# Patient Record
Sex: Female | Born: 1960 | Race: White | Hispanic: No | Smoking: Never smoker
Health system: Southern US, Community
[De-identification: ages and names within clinical notes are randomized; demographics above are authoritative.]

## PROBLEM LIST (undated history)

## (undated) DIAGNOSIS — E119 Type 2 diabetes mellitus without complications: Secondary | ICD-10-CM

## (undated) DIAGNOSIS — E079 Disorder of thyroid, unspecified: Secondary | ICD-10-CM

## (undated) DIAGNOSIS — T7840XA Allergy, unspecified, initial encounter: Secondary | ICD-10-CM

## (undated) DIAGNOSIS — J40 Bronchitis, not specified as acute or chronic: Secondary | ICD-10-CM

## (undated) DIAGNOSIS — Z923 Personal history of irradiation: Secondary | ICD-10-CM

## (undated) DIAGNOSIS — E785 Hyperlipidemia, unspecified: Secondary | ICD-10-CM

## (undated) DIAGNOSIS — C50919 Malignant neoplasm of unspecified site of unspecified female breast: Secondary | ICD-10-CM

## (undated) DIAGNOSIS — J45909 Unspecified asthma, uncomplicated: Secondary | ICD-10-CM

## (undated) DIAGNOSIS — I10 Essential (primary) hypertension: Secondary | ICD-10-CM

## (undated) DIAGNOSIS — C50311 Malignant neoplasm of lower-inner quadrant of right female breast: Secondary | ICD-10-CM

## (undated) DIAGNOSIS — Z9221 Personal history of antineoplastic chemotherapy: Secondary | ICD-10-CM

## (undated) HISTORY — DX: Bronchitis, not specified as acute or chronic: J40

## (undated) HISTORY — PX: DILATION AND CURETTAGE OF UTERUS: SHX78

## (undated) HISTORY — DX: Disorder of thyroid, unspecified: E07.9

## (undated) HISTORY — DX: Malignant neoplasm of unspecified site of unspecified female breast: C50.919

## (undated) HISTORY — DX: Unspecified asthma, uncomplicated: J45.909

## (undated) HISTORY — DX: Hyperlipidemia, unspecified: E78.5

## (undated) HISTORY — DX: Malignant neoplasm of lower-inner quadrant of right female breast: C50.311

## (undated) HISTORY — DX: Type 2 diabetes mellitus without complications: E11.9

## (undated) HISTORY — DX: Allergy, unspecified, initial encounter: T78.40XA

## (undated) HISTORY — DX: Essential (primary) hypertension: I10

---

## 2005-09-03 ENCOUNTER — Ambulatory Visit: Payer: Self-pay | Admitting: Unknown Physician Specialty

## 2006-12-19 ENCOUNTER — Ambulatory Visit: Payer: Self-pay | Admitting: Unknown Physician Specialty

## 2008-01-08 ENCOUNTER — Ambulatory Visit: Payer: Self-pay | Admitting: Unknown Physician Specialty

## 2008-12-09 ENCOUNTER — Ambulatory Visit: Payer: Self-pay | Admitting: Internal Medicine

## 2008-12-23 ENCOUNTER — Ambulatory Visit: Payer: Self-pay | Admitting: Internal Medicine

## 2009-01-09 ENCOUNTER — Ambulatory Visit: Payer: Self-pay | Admitting: Internal Medicine

## 2009-02-03 ENCOUNTER — Ambulatory Visit: Payer: Self-pay | Admitting: Unknown Physician Specialty

## 2009-02-08 ENCOUNTER — Ambulatory Visit: Payer: Self-pay | Admitting: Internal Medicine

## 2009-03-11 ENCOUNTER — Ambulatory Visit: Payer: Self-pay | Admitting: Internal Medicine

## 2009-06-09 ENCOUNTER — Ambulatory Visit: Payer: Self-pay | Admitting: Internal Medicine

## 2009-07-07 ENCOUNTER — Ambulatory Visit: Payer: Self-pay | Admitting: Internal Medicine

## 2009-07-09 ENCOUNTER — Ambulatory Visit: Payer: Self-pay | Admitting: Internal Medicine

## 2010-01-05 ENCOUNTER — Ambulatory Visit: Payer: Self-pay | Admitting: Internal Medicine

## 2010-01-09 ENCOUNTER — Ambulatory Visit: Payer: Self-pay | Admitting: Internal Medicine

## 2010-02-16 ENCOUNTER — Ambulatory Visit: Payer: Self-pay | Admitting: Unknown Physician Specialty

## 2010-03-20 ENCOUNTER — Ambulatory Visit: Payer: Self-pay | Admitting: Internal Medicine

## 2010-04-11 ENCOUNTER — Ambulatory Visit: Payer: Self-pay | Admitting: Internal Medicine

## 2010-07-13 ENCOUNTER — Ambulatory Visit: Payer: Self-pay | Admitting: Internal Medicine

## 2010-08-08 LAB — CA 125: CA 125: 8.8 U/mL (ref 0.0–34.0)

## 2010-08-10 ENCOUNTER — Ambulatory Visit: Payer: Self-pay | Admitting: Internal Medicine

## 2011-04-24 ENCOUNTER — Ambulatory Visit: Payer: Self-pay | Admitting: Unknown Physician Specialty

## 2013-03-11 DIAGNOSIS — C50919 Malignant neoplasm of unspecified site of unspecified female breast: Secondary | ICD-10-CM

## 2013-03-11 HISTORY — DX: Malignant neoplasm of unspecified site of unspecified female breast: C50.919

## 2013-08-09 DIAGNOSIS — Z171 Estrogen receptor negative status [ER-]: Secondary | ICD-10-CM

## 2013-08-09 DIAGNOSIS — C50311 Malignant neoplasm of lower-inner quadrant of right female breast: Secondary | ICD-10-CM

## 2013-08-09 HISTORY — DX: Malignant neoplasm of lower-inner quadrant of right female breast: C50.311

## 2013-09-03 ENCOUNTER — Ambulatory Visit: Payer: Self-pay | Admitting: Unknown Physician Specialty

## 2013-09-07 ENCOUNTER — Encounter: Payer: Self-pay | Admitting: General Surgery

## 2013-09-07 ENCOUNTER — Ambulatory Visit: Payer: BC Managed Care – PPO

## 2013-09-07 ENCOUNTER — Other Ambulatory Visit: Payer: Self-pay | Admitting: General Surgery

## 2013-09-07 ENCOUNTER — Ambulatory Visit (INDEPENDENT_AMBULATORY_CARE_PROVIDER_SITE_OTHER): Payer: BC Managed Care – PPO | Admitting: General Surgery

## 2013-09-07 VITALS — BP 158/80 | HR 80 | Resp 14 | Ht 62.0 in | Wt 230.0 lb

## 2013-09-07 DIAGNOSIS — N63 Unspecified lump in unspecified breast: Secondary | ICD-10-CM

## 2013-09-07 DIAGNOSIS — N6310 Unspecified lump in the right breast, unspecified quadrant: Secondary | ICD-10-CM

## 2013-09-07 HISTORY — PX: BREAST BIOPSY: SHX20

## 2013-09-07 NOTE — Progress Notes (Signed)
Patient ID: Felicia Kidd, female   DOB: December 03, 1960, 53 y.o.   MRN: [id number]  Chief Complaint  Patient presents with  . Breast Problem    cat 4 mammogam    HPI Felicia Kidd is a 53 y.o. female. who presents for a breast evaluation. The most recent mammogram was done on 09/02/13. Patient does perform regular self breast checks and gets regular mammograms done.  States she can't feel anything different in the breast. Grand kids may jump on her occasionally but no other breast trauma.  No family history of breast cancer.  The patient is accompanied today by her mother-in-law. The patient was widowed 7 years ago. She is quite close to her mother-in-law.   HPI  Past Medical History  Diagnosis Date  . Diabetes mellitus without complication   . Hyperlipidemia   . Hypertension   . Thyroid disease     Past Surgical History  Procedure Laterality Date  . Dilation and curettage of uterus      Dr Vernie Ammons    No family history on file.  Social History History  Substance Use Topics  . Smoking status: Never Smoker   . Smokeless tobacco: Never Used  . Alcohol Use: No    No Known Allergies  Current Outpatient Prescriptions  Medication Sig Dispense Refill  . atorvastatin (LIPITOR) 40 MG tablet Take 40 mg by mouth daily.      . fluticasone (FLONASE) 50 MCG/ACT nasal spray Place 2 sprays into both nostrils daily.      . Halcinonide (HALOG) 0.1 % CREA Apply 1 Tube topically 2 (two) times daily.      Marland Kitchen omeprazole (PRILOSEC) 20 MG capsule Take 20 mg by mouth daily.      Marland Kitchen telmisartan (MICARDIS) 80 MG tablet Take 80 mg by mouth daily.       No current facility-administered medications for this visit.    Review of Systems Review of Systems  Constitutional: Negative.   Respiratory: Negative.   Cardiovascular: Negative.     Blood pressure 158/80, pulse 80, resp. rate 14, height 5\' 2"  (1.575 m), weight 230 lb (104.327 kg).  Physical Exam Physical Exam  Constitutional: She  is oriented to person, place, and time. She appears well-developed and well-nourished.  Neck: Neck supple.  Cardiovascular: Normal rate, regular rhythm and normal heart sounds.   Pulmonary/Chest: Effort normal and breath sounds normal. Right breast exhibits no inverted nipple, no mass, no nipple discharge, no skin change and no tenderness. Left breast exhibits no inverted nipple, no mass, no nipple discharge, no skin change and no tenderness.    Right nipple transverse crease, intermittently present over several years by patient report, partially reducible with gentle pressure.   Thickening at 6 o'clock right breast 1.5 cm at 3 CFN  Lymphadenopathy:    She has no cervical adenopathy.    She has no axillary adenopathy.  Neurological: She is alert and oriented to person, place, and time.  Skin: Skin is warm and dry.    Data Reviewed 2013 thru 2015 mammograms and ultrasound studies were reviewed.   Screening mammograms completed at [place] dated 08/30/2013 suggested an ill-defined density in the right breast for which additional imaging was recommended. BI-RAD-0.  Spot compression views of the right breast an ultrasound dated 09/03/2013 confirmed a persistent mass. Ultrasound showed a 1.4 cm mass in the 6:00 position 4 cm from nipple as well as a suggested abnormal right axillary lymph node. BI-RAD-4.  Ultrasound examination of the right  breast confirmed a 1.0 x 1.3 x 1.47 cm irregular hypoechoic mass with focal shadowing.  This was at the 6:00 position, 3 cm from the nipple.  BI-RAD-5.     The patient was amenable to core biopsy. 10 cc of 0.5% Xylocaine with 0.25% Marcaine with 1-200,000 units of epinephrine was utilized well tolerated. Chlor prep was applied to the skin. Under ultrasound guidance a 14-gauge Finesse device was advanced and multiple core samples obtained from 2 locations. Scant bleeding was noted. A postbiopsy clip was NOT placed. (Should the patient be a candidate for  neoadjuvant chemotherapy this will be placed at a later date). The skin defect was closed with benzoin and Steri-Strips followed by Telfa and Tegaderm dressing.  Attention was turned to the axilla. There appeared to be a 0.9 x 1.2 cm irregular mass in the lower aspect of the axilla. FNA sampling was completed with a 22-gauge needle. 2 sets of slides were obtained (4 slides total) were submitted for pathologic review.   Assessment    Abnormal right breast mammogram, highly suspicious for malignancy.    Plan    The clinical impression was reviewed with the patient and her mother-in-law. She will be contacted when the pathology is available. She is aware that there would be a three day delay for the cytology report on the axillary area.    Patient will be sent to [facility] today to have the following labs drawn: CEA and CA 27.29.   PCP: Glendon Axe Ref Lonie Peak 09/08/2013, 7:05 AM

## 2013-09-07 NOTE — Patient Instructions (Addendum)
CARE AFTER BREAST BIOPSY  1. Leave the dressing on that your doctor applied after surgery. It is waterproof. You may bathe, shower and/or swim. The dressing will probably remain intact until your return office visit. If the dressing comes off, you will see small strips of tape against your skin on the incision. Do not remove these strips.  2. You may want to use a gauze,cloth or similar protection in your bra to prevent rubbing against your dressing and incision. This is not necessary, but you may feel more comfortable doing so.  3. It is recommended that you wear a bra day and night to give support to the breast. This will prevent the weight of the breast from pulling on the incision.  4. Your breast will feel hard and lumpy under the incision. Do not be alarmed. This is the underlying stitching of tissue. Softening of this tissue will occur in time.  5. Make sure you call the office and schedule an appointment in one week after your surgery. The office phone number is [phone]. The [facility] may have already done this for you.  6. You will notice about a week after your office visit that the strips of the tape on your incision will begin to loosen. These may then be removed.  7. Report to your doctor any of the following:  * Severe pain not relieved by your pain medication  *Redness of the incision  * Drainage from the incision  *Fever greater than 101 degrees  Patient will be sent to [facility] today to have the following labs drawn: CEA and CA 27.29.

## 2013-09-08 ENCOUNTER — Telehealth: Payer: Self-pay | Admitting: General Surgery

## 2013-09-08 DIAGNOSIS — N6310 Unspecified lump in the right breast, unspecified quadrant: Secondary | ICD-10-CM | POA: Insufficient documentation

## 2013-09-08 LAB — FINE-NEEDLE ASPIRATION

## 2013-09-08 LAB — CEA: CEA: 1.7 ng/mL (ref 0.0–4.7)

## 2013-09-08 LAB — CANCER ANTIGEN 27.29: CA 27.29: 19.2 U/mL (ref 0.0–38.6)

## 2013-09-08 NOTE — Telephone Encounter (Signed)
Notified of path results.  Arrangements in place to meet tomorrow afternoon at 5 PM.

## 2013-09-09 ENCOUNTER — Ambulatory Visit (INDEPENDENT_AMBULATORY_CARE_PROVIDER_SITE_OTHER): Payer: BC Managed Care – PPO | Admitting: General Surgery

## 2013-09-09 ENCOUNTER — Encounter: Payer: Self-pay | Admitting: General Surgery

## 2013-09-09 VITALS — BP 140/78 | HR 76 | Resp 12 | Ht 62.0 in | Wt 230.0 lb

## 2013-09-09 DIAGNOSIS — N6310 Unspecified lump in the right breast, unspecified quadrant: Secondary | ICD-10-CM

## 2013-09-09 DIAGNOSIS — N63 Unspecified lump in unspecified breast: Secondary | ICD-10-CM

## 2013-09-09 DIAGNOSIS — C50919 Malignant neoplasm of unspecified site of unspecified female breast: Secondary | ICD-10-CM | POA: Insufficient documentation

## 2013-09-09 DIAGNOSIS — C50911 Malignant neoplasm of unspecified site of right female breast: Secondary | ICD-10-CM

## 2013-09-09 LAB — IMMUNOHISTOCHEMICAL STAIN(S)

## 2013-09-09 NOTE — Progress Notes (Signed)
Patient ID: Felicia Kidd, female   DOB: 1960-12-25, 53 y.o.   MRN: [id number]  Chief Complaint  Patient presents with  . Follow-up    breast discussion    HPI Felicia Kidd is a 53 y.o. female here today for an breast discussion.Patient here today for follow up post right breast biopsy.  Dressing removed, steristrip in place and aware it may come off in one week.  Minimal bruising noted.  The patient is aware that a heating pad may be used for comfort as needed.  Aware of pathology.  HPI  Past Medical History  Diagnosis Date  . Diabetes mellitus without complication   . Hyperlipidemia   . Hypertension   . Thyroid disease     Past Surgical History  Procedure Laterality Date  . Dilation and curettage of uterus      Dr Vernie Ammons    No family history on file.  Social History History  Substance Use Topics  . Smoking status: Never Smoker   . Smokeless tobacco: Never Used  . Alcohol Use: No    No Known Allergies  Current Outpatient Prescriptions  Medication Sig Dispense Refill  . atorvastatin (LIPITOR) 40 MG tablet Take 40 mg by mouth daily.      . fluticasone (FLONASE) 50 MCG/ACT nasal spray Place 2 sprays into both nostrils daily.      . Halcinonide (HALOG) 0.1 % CREA Apply 1 Tube topically 2 (two) times daily.      Marland Kitchen omeprazole (PRILOSEC) 20 MG capsule Take 20 mg by mouth daily.      Marland Kitchen telmisartan (MICARDIS) 80 MG tablet Take 80 mg by mouth daily.       No current facility-administered medications for this visit.    Review of Systems Review of Systems  Respiratory: Negative.   Cardiovascular: Negative.     Blood pressure 140/78, pulse 76, resp. rate 12, height 5\' 2"  (1.575 m), weight 230 lb (104.327 kg).  Physical Exam Physical Exam  Data Reviewed Pathology showed a histologic grade 3 invasive mammary carcinoma of no specific type. Receptor status pending. Cytology on the axillary node FNA pending. CEA and CA 27-29 are normal. Assessment    Right  breast cancer, clinical stage 1, possible stage II based on abnormal axillary node on ultrasound.     Plan    The patient was accompanied today by her mother-in-law, son and sister. We spent the better part of an hour reviewing options for management. Mastectomy and breast conservation were present it is equivalent procedures. Considering the rapid development of a grade 3 latency over the past year, I suspect she will likely benefit from chemotherapy. Whether this is administered in the neoadjuvant or simply as adjuvant therapy wwill likely be determined by the cytology on the lymph node.  It was emphasized that whether or not the lymph gland is positive would not affect her choices for management of the breast primary.  The role for radiation therapy should breast conservation be chosen was discussed. An informational brochure was provided. She'll be contacted when her pending studies are available.       Robert Bellow 09/09/2013, 8:00 PM

## 2013-09-14 ENCOUNTER — Telehealth: Payer: Self-pay | Admitting: General Surgery

## 2013-09-14 NOTE — Telephone Encounter (Signed)
Reviewed cyto results with patient.   Plan for breast conservation unless tumor is markedly larger than expected.  Will arrange for wide excision, SLN and mastectomy only if needed.

## 2013-09-15 ENCOUNTER — Telehealth: Payer: Self-pay | Admitting: *Deleted

## 2013-09-15 ENCOUNTER — Other Ambulatory Visit: Payer: Self-pay | Admitting: General Surgery

## 2013-09-15 DIAGNOSIS — C50911 Malignant neoplasm of unspecified site of right female breast: Secondary | ICD-10-CM

## 2013-09-15 NOTE — Telephone Encounter (Signed)
Patient was contacted to arrange a surgery date. This has been scheduled for 09-27-13 at [facility]. Paperwork has been mailed to the patient. She was instructed to call the office should she have further questions.  Patient reports last menstrual period was greater than 2 years ago.

## 2013-09-17 LAB — HER-2 / NEU, FISH
Avg Num CEP17 probes/nucleus:: 2.1
Avg Num Her-2 signals/nucleus:: 2.6
HER-2/CEP17 RATIO: 1.24
NUMBER OF TUMOR CELLS COUNTED: 40
Number of Observers:: 2

## 2013-09-17 LAB — PATHOLOGY

## 2013-09-20 ENCOUNTER — Encounter: Payer: Self-pay | Admitting: General Surgery

## 2013-09-22 ENCOUNTER — Ambulatory Visit: Payer: Self-pay | Admitting: General Surgery

## 2013-09-22 LAB — COMPREHENSIVE METABOLIC PANEL
ALBUMIN: 3.7 g/dL (ref 3.4–5.0)
ALK PHOS: 122 U/L — AB
Anion Gap: 5 — ABNORMAL LOW (ref 7–16)
BUN: 14 mg/dL (ref 7–18)
Bilirubin,Total: 0.5 mg/dL (ref 0.2–1.0)
Calcium, Total: 9.4 mg/dL (ref 8.5–10.1)
Chloride: 101 mmol/L (ref 98–107)
Co2: 30 mmol/L (ref 21–32)
Creatinine: 0.7 mg/dL (ref 0.60–1.30)
EGFR (African American): >60
EGFR (Non-African Amer.): >60
GLUCOSE: 105 mg/dL — AB (ref 65–99)
Osmolality: 273 (ref 275–301)
POTASSIUM: 4 mmol/L (ref 3.5–5.1)
SGOT(AST): 20 U/L (ref 15–37)
SGPT (ALT): 32 U/L (ref 12–78)
SODIUM: 136 mmol/L (ref 136–145)
Total Protein: 7.4 g/dL (ref 6.4–8.2)

## 2013-09-22 LAB — CBC WITH DIFFERENTIAL/PLATELET
Basophil #: 0.1 10*3/uL (ref 0.0–0.1)
Basophil %: 0.5 %
Eosinophil #: 0.3 10*3/uL (ref 0.0–0.7)
Eosinophil %: 2.4 %
HCT: 40.2 % (ref 35.0–47.0)
HGB: 13.5 g/dL (ref 12.0–16.0)
LYMPHS PCT: 37 %
Lymphocyte #: 4 10*3/uL — ABNORMAL HIGH (ref 1.0–3.6)
MCH: 30.4 pg (ref 26.0–34.0)
MCHC: 33.6 g/dL (ref 32.0–36.0)
MCV: 91 fL (ref 80–100)
MONO ABS: 0.8 x10 3/mm (ref 0.2–0.9)
MONOS PCT: 7.4 %
NEUTROS ABS: 5.7 10*3/uL (ref 1.4–6.5)
Neutrophil %: 52.7 %
Platelet: 342 10*3/uL (ref 150–440)
RBC: 4.45 10*6/uL (ref 3.80–5.20)
RDW: 12.7 % (ref 11.5–14.5)
WBC: 10.9 10*3/uL (ref 3.6–11.0)

## 2013-09-27 ENCOUNTER — Ambulatory Visit: Payer: Self-pay | Admitting: General Surgery

## 2013-09-27 DIAGNOSIS — C50519 Malignant neoplasm of lower-outer quadrant of unspecified female breast: Secondary | ICD-10-CM

## 2013-09-27 HISTORY — PX: BREAST SURGERY: SHX581

## 2013-09-27 HISTORY — PX: BREAST LUMPECTOMY: SHX2

## 2013-09-28 ENCOUNTER — Encounter: Payer: Self-pay | Admitting: General Surgery

## 2013-09-29 ENCOUNTER — Encounter: Payer: Self-pay | Admitting: General Surgery

## 2013-09-30 ENCOUNTER — Encounter: Payer: Self-pay | Admitting: General Surgery

## 2013-09-30 ENCOUNTER — Ambulatory Visit: Payer: Self-pay | Admitting: Oncology

## 2013-09-30 ENCOUNTER — Ambulatory Visit (INDEPENDENT_AMBULATORY_CARE_PROVIDER_SITE_OTHER): Payer: Self-pay | Admitting: General Surgery

## 2013-09-30 ENCOUNTER — Encounter: Payer: Self-pay | Admitting: *Deleted

## 2013-09-30 VITALS — BP 142/82 | HR 88 | Resp 12 | Ht 62.0 in | Wt 239.0 lb

## 2013-09-30 DIAGNOSIS — C50911 Malignant neoplasm of unspecified site of right female breast: Secondary | ICD-10-CM

## 2013-09-30 DIAGNOSIS — C50919 Malignant neoplasm of unspecified site of unspecified female breast: Secondary | ICD-10-CM

## 2013-09-30 LAB — PATHOLOGY REPORT

## 2013-09-30 NOTE — Patient Instructions (Signed)
The patient is aware to call back for any questions or concerns.  

## 2013-09-30 NOTE — Progress Notes (Signed)
Patient ID: Felicia Kidd, female   DOB: Oct 03, 1960, 53 y.o.   MRN: [id number]  Chief Complaint  Patient presents with  . Routine Post Op    Wide excision of Right breast lesion with sentinel node examination    HPI Felicia Kidd is a 53 y.o. female here today for postoperative visit, wide excision of right breast lesion with sentinel node on 09-2013. States she is getting along well, drain sheet present. The patient reports minimal pain. Drainage volumes have been significant.    Marland KitchenHPI  Past Medical History  Diagnosis Date  . Diabetes mellitus without complication   . Hyperlipidemia   . Hypertension   . Thyroid disease   . 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 Dilation and curettage of uterus      Dr Vernie Ammons  . Breast surgery Right 09-27-13    Wide excision of Right breast lesion with axillary dissection    No family history on file.  Social History History  Substance Use Topics  . Smoking status: Never Smoker   . Smokeless tobacco: Never Used  . Alcohol Use: No    No Known Allergies  Current Outpatient Prescriptions  Medication Sig Dispense Refill  . acetaminophen (TYLENOL) 325 MG tablet Take 650 mg by mouth every 6 (six) hours as needed.      Marland Kitchen atorvastatin (LIPITOR) 40 MG tablet Take 40 mg by mouth daily.      . fluocinonide ointment (LIDEX) 0.05 % Apply topically as needed.       . fluticasone (FLONASE) 50 MCG/ACT nasal spray Place 2 sprays into both nostrils daily.      . Halcinonide (HALOG) 0.1 % CREA Apply 1 Tube topically 2 (two) times daily.      Marland Kitchen HYDROcodone-acetaminophen (NORCO/VICODIN) 5-325 MG per tablet Take 1 tablet by mouth every 6 (six) hours as needed.       . metFORMIN (GLUCOPHAGE) 500 MG tablet       . nystatin-triamcinolone (MYCOLOG II) cream Apply 1 application topically as needed.       Marland Kitchen omeprazole (PRILOSEC) 20 MG  capsule Take 20 mg by mouth daily.      Marland Kitchen telmisartan (MICARDIS) 80 MG tablet Take 80 mg by mouth daily.       No current facility-administered medications for this visit.    Review of Systems Review of Systems  Constitutional: Negative.   Respiratory: Negative.   Cardiovascular: Negative.     Blood pressure 142/82, pulse 88, resp. rate 12, height 5\' 2"  (1.575 m), weight 239 lb (108.41 kg).  Physical Exam Physical Exam  Constitutional: She is oriented to person, place, and time. She appears well-developed and well-nourished.  Pulmonary/Chest:    Neurological: She is alert and oriented to person, place, and time.  Skin: Skin is warm and dry.    Data Reviewed Pathology showed a 14 mm tumor. 6 of 14 nodes positive, 5 of 6 with extracapsular extension. Triple negative.  Assessment    Stage II carcinoma of the right breast.    Plan    Indications for adjuvant chemotherapy was reviewed. The patient requested assessment by Forest Gleason, M.D., Scheduled for Monday, and 10/04/2013.  A PET CT has be arranged, presently scheduled for 09/27/2013. A new compressive wrap was applied which the patient will removed on Sunday, July 26. She may begin showering at that time.  She will continue to require a drain volumes. Where a broad day and night for support.     PCP: Despina Arias 09/30/2013, 9:00 AM

## 2013-09-30 NOTE — Progress Notes (Signed)
Patient ID: Felicia Kidd, female   DOB: 03/20/1960, 53 y.o.   MRN: [id number]  Patient has been scheduled for an appointment with Dr. Oliva Bustard at the [facility] for Monday, 10-04-13 at 11 am (arrive 10:45 am). Records have been forwarded for his review.   This patient has also been scheduled for a PET scan [facility] for 10-07-13 at 8 am (arrive 7:45 am). She is aware of the diabetic prep instructions for this test.   Patient's port placement has been scheduled for 10-14-13 at [facility]. She will be contacted from the pre-admission department for a phone interview.  Follow up with Dr. Bary Castilla will be next week as scheduled.   Patient aware to call the office if she has further questions.

## 2013-10-04 ENCOUNTER — Ambulatory Visit: Payer: Self-pay | Admitting: Oncology

## 2013-10-05 ENCOUNTER — Other Ambulatory Visit: Payer: Self-pay | Admitting: General Surgery

## 2013-10-05 DIAGNOSIS — C50911 Malignant neoplasm of unspecified site of right female breast: Secondary | ICD-10-CM

## 2013-10-06 ENCOUNTER — Encounter: Payer: Self-pay | Admitting: *Deleted

## 2013-10-06 NOTE — Progress Notes (Signed)
Patient ID: Felicia Kidd, female   DOB: 04-11-1960, 53 y.o.   MRN: [id number]  Patient's insurance has not approved PET scan that was scheduled at [facility] for 10-07-13. This has been rescheduled to Wednesday, 10-13-13 at 9 am (patient to arrive at 8:45 am). Patient is aware of date change and verbalizes understanding.   Dr. Bary Castilla has been instructed to call her insurance company for [place] MD review (not peer to peer) at [phone] and press option #1 or #7.   Patient will follow up in the office tomorrow as previously scheduled.

## 2013-10-07 ENCOUNTER — Encounter: Payer: Self-pay | Admitting: General Surgery

## 2013-10-07 ENCOUNTER — Ambulatory Visit (INDEPENDENT_AMBULATORY_CARE_PROVIDER_SITE_OTHER): Payer: Self-pay | Admitting: General Surgery

## 2013-10-07 VITALS — BP 110/80 | HR 78 | Resp 12 | Ht 62.0 in | Wt 235.0 lb

## 2013-10-07 DIAGNOSIS — C50919 Malignant neoplasm of unspecified site of unspecified female breast: Secondary | ICD-10-CM

## 2013-10-07 DIAGNOSIS — C50911 Malignant neoplasm of unspecified site of right female breast: Secondary | ICD-10-CM

## 2013-10-07 NOTE — Patient Instructions (Signed)
Patient to return in 2 weeks for follow up. The patient is aware to call back for any questions or concerns.  

## 2013-10-07 NOTE — Progress Notes (Signed)
Patient ID: Felicia Kidd, female   DOB: February 11, 1961, 53 y.o.   MRN: [id number]  Chief Complaint  Patient presents with  . Follow-up    1 week post op right lumpectomy    HPI Felicia Kidd is a 53 y.o. female who presents for a post op 1 week right breast lumpectomy. The procedure was performed on 09/27/13. The patient denies any new problems at this time. Drainage volumes are beginning to decline. No discernible discomfort with shoulder use. The patient has been evaluated by medical oncology and found to be a candidate for adjuvant treatment.  HPI  Past Medical History  Diagnosis Date  . Diabetes mellitus without complication   . Hyperlipidemia   . Hypertension   . Thyroid disease   . 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    Past Surgical History  Procedure Laterality Date  . Dilation and curettage of uterus      Dr Vernie Ammons  . Breast surgery Right 09-27-13    Wide excision of Right breast lesion with axillary dissection    No family history on file.  Social History History  Substance Use Topics  . Smoking status: Never Smoker   . Smokeless tobacco: Never Used  . Alcohol Use: No    No Known Allergies  Current Outpatient Prescriptions  Medication Sig Dispense Refill  . acetaminophen (TYLENOL) 325 MG tablet Take 650 mg by mouth every 6 (six) hours as needed.      Marland Kitchen atorvastatin (LIPITOR) 40 MG tablet Take 40 mg by mouth daily.      . fluocinonide ointment (LIDEX) 0.05 % Apply topically as needed.       . fluticasone (FLONASE) 50 MCG/ACT nasal spray Place 2 sprays into both nostrils daily.      . Halcinonide (HALOG) 0.1 % CREA Apply 1 Tube topically 2 (two) times daily.      Marland Kitchen HYDROcodone-acetaminophen (NORCO/VICODIN) 5-325 MG per tablet Take 1 tablet by mouth every 6 (six) hours as needed.       . metFORMIN (GLUCOPHAGE) 500 MG tablet       . nystatin-triamcinolone (MYCOLOG II) cream Apply 1  application topically as needed.       Marland Kitchen omeprazole (PRILOSEC) 20 MG capsule Take 20 mg by mouth daily.      Marland Kitchen telmisartan (MICARDIS) 80 MG tablet Take 80 mg by mouth daily.      Marland Kitchen triamcinolone (KENALOG) 0.025 % cream        No current facility-administered medications for this visit.    Review of Systems Review of Systems  Constitutional: Negative.   Respiratory: Negative.   Cardiovascular: Negative.     Blood pressure 110/80, pulse 78, resp. rate 12, height 5\' 2"  (1.575 m), weight 235 lb (106.595 kg).  Physical Exam Physical Exam  Constitutional: She is oriented to person, place, and time. She appears well-developed and well-nourished.  Cardiovascular: Normal rate, regular rhythm and normal heart sounds.   No murmur heard. Pulmonary/Chest: Effort normal and breath sounds normal.    Incision at 6 o'clock has a little bit of redness. Axilla incision healing well.   Neurological: She is alert and oriented to person, place, and time.  Skin: Skin is warm and dry.    Data Reviewed Pathology shows a T1c,N2a carcinoma, triple negative. All margins clear. Drainage volumes still running about 50 cc per day.  Assessment    Doing well status post partial mastectomy  and axillar dissection.    Plan    The patient was originally scheduled for a PET scan today. This is finally been improved and is now scheduled for August 5. Power port placement is scheduled for August 6. She's been encouraged to bring her wound drainage records with her as the drain may be removed at that time. She'll be notified of culture results when they're available.    PCP: Despina Arias 10/08/2013, 8:27 AM

## 2013-10-08 ENCOUNTER — Encounter: Payer: Self-pay | Admitting: General Surgery

## 2013-10-09 ENCOUNTER — Ambulatory Visit: Payer: Self-pay | Admitting: Oncology

## 2013-10-09 DIAGNOSIS — Z9221 Personal history of antineoplastic chemotherapy: Secondary | ICD-10-CM

## 2013-10-09 HISTORY — DX: Personal history of antineoplastic chemotherapy: Z92.21

## 2013-10-12 ENCOUNTER — Ambulatory Visit (INDEPENDENT_AMBULATORY_CARE_PROVIDER_SITE_OTHER): Payer: Self-pay | Admitting: *Deleted

## 2013-10-12 ENCOUNTER — Ambulatory Visit: Payer: Self-pay | Admitting: Oncology

## 2013-10-12 DIAGNOSIS — C50919 Malignant neoplasm of unspecified site of unspecified female breast: Secondary | ICD-10-CM

## 2013-10-12 DIAGNOSIS — C50911 Malignant neoplasm of unspecified site of right female breast: Secondary | ICD-10-CM

## 2013-10-12 LAB — ANAEROBIC AND AEROBIC CULTURE

## 2013-10-12 MED ORDER — CIPROFLOXACIN HCL 500 MG PO TABS: 500.0000 mg | ORAL_TABLET | Freq: Two times a day (BID) | ORAL | Status: DC

## 2013-10-12 NOTE — Patient Instructions (Signed)
Follow up as scheduled on Thursday for wound check

## 2013-10-12 NOTE — Progress Notes (Signed)
Patient came in today for a drain/wound check.  The wound is with redness. Drainage has been > 40 ml a day. Drain removed by Dr. Bary Castilla. Follow up as scheduled on Thursday for wound check.

## 2013-10-13 ENCOUNTER — Ambulatory Visit: Payer: Self-pay | Admitting: Oncology

## 2013-10-13 ENCOUNTER — Telehealth: Payer: Self-pay | Admitting: *Deleted

## 2013-10-13 ENCOUNTER — Ambulatory Visit: Payer: Self-pay | Admitting: General Surgery

## 2013-10-13 ENCOUNTER — Encounter: Payer: Self-pay | Admitting: General Surgery

## 2013-10-13 LAB — CBC CANCER CENTER
BASOS ABS: 0.1 x10 3/mm (ref 0.0–0.1)
Basophil %: 0.5 %
EOS ABS: 0.3 x10 3/mm (ref 0.0–0.7)
Eosinophil %: 2.3 %
HCT: 38.2 % (ref 35.0–47.0)
HGB: 12.7 g/dL (ref 12.0–16.0)
LYMPHS ABS: 3.2 x10 3/mm (ref 1.0–3.6)
Lymphocyte %: 26.2 %
MCH: 30 pg (ref 26.0–34.0)
MCHC: 33.4 g/dL (ref 32.0–36.0)
MCV: 90 fL (ref 80–100)
MONO ABS: 0.9 x10 3/mm (ref 0.2–0.9)
MONOS PCT: 7.6 %
NEUTROS PCT: 63.4 %
Neutrophil #: 7.7 x10 3/mm — ABNORMAL HIGH (ref 1.4–6.5)
Platelet: 382 x10 3/mm (ref 150–440)
RBC: 4.25 10*6/uL (ref 3.80–5.20)
RDW: 12.6 % (ref 11.5–14.5)
WBC: 12.1 x10 3/mm — AB (ref 3.6–11.0)

## 2013-10-13 LAB — COMPREHENSIVE METABOLIC PANEL
ALT: 29 U/L
AST: 14 U/L — AB (ref 15–37)
Albumin: 3.3 g/dL — ABNORMAL LOW (ref 3.4–5.0)
Alkaline Phosphatase: 120 U/L — ABNORMAL HIGH
Anion Gap: 6 — ABNORMAL LOW (ref 7–16)
BUN: 14 mg/dL (ref 7–18)
Bilirubin,Total: 0.4 mg/dL (ref 0.2–1.0)
CHLORIDE: 99 mmol/L (ref 98–107)
Calcium, Total: 9 mg/dL (ref 8.5–10.1)
Co2: 31 mmol/L (ref 21–32)
Creatinine: 0.58 mg/dL — ABNORMAL LOW (ref 0.60–1.30)
EGFR (African American): >60
Glucose: 118 mg/dL — ABNORMAL HIGH (ref 65–99)
OSMOLALITY: 274 (ref 275–301)
Potassium: 4.1 mmol/L (ref 3.5–5.1)
SODIUM: 136 mmol/L (ref 136–145)
Total Protein: 7 g/dL (ref 6.4–8.2)

## 2013-10-13 LAB — MAGNESIUM: MAGNESIUM: 2.1 mg/dL

## 2013-10-13 NOTE — Telephone Encounter (Signed)
I talked with the patient and she feels the area is still red and irritated and that she may just want to postpone surgery, Dr Bary Castilla aware. Will discuss in detail at the morning appointment.

## 2013-10-13 NOTE — Telephone Encounter (Signed)
Pt wants to talk to you, she saw you yesterday

## 2013-10-14 ENCOUNTER — Encounter: Payer: Self-pay | Admitting: *Deleted

## 2013-10-14 ENCOUNTER — Ambulatory Visit: Payer: BC Managed Care – PPO | Admitting: *Deleted

## 2013-10-14 DIAGNOSIS — C50911 Malignant neoplasm of unspecified site of right female breast: Secondary | ICD-10-CM

## 2013-10-14 NOTE — Progress Notes (Signed)
Patient ID: Felicia Kidd, female   DOB: 1960/07/31, 53 y.o.   MRN: [id number]  Patient's surgery has been moved from 10-14-13 to 10-27-13 at [facility].   Donnna in the [facility].R. and Lattie Haw at the [facility] has been notified of date change.

## 2013-10-14 NOTE — Patient Instructions (Signed)
Follow up as scheduled.  

## 2013-10-14 NOTE — Progress Notes (Signed)
Here today for right breast wound check. The area is much improved from 2 days ago, however it is still red and swollen. She prefers to reschedule surgery to later date, MD made aware of request.

## 2013-10-15 ENCOUNTER — Encounter: Payer: Self-pay | Admitting: General Surgery

## 2013-10-15 ENCOUNTER — Other Ambulatory Visit: Payer: Self-pay | Admitting: General Surgery

## 2013-10-15 DIAGNOSIS — C50911 Malignant neoplasm of unspecified site of right female breast: Secondary | ICD-10-CM

## 2013-10-18 ENCOUNTER — Encounter: Payer: Self-pay | Admitting: General Surgery

## 2013-10-18 ENCOUNTER — Ambulatory Visit (INDEPENDENT_AMBULATORY_CARE_PROVIDER_SITE_OTHER): Payer: Self-pay | Admitting: General Surgery

## 2013-10-18 ENCOUNTER — Ambulatory Visit: Payer: BC Managed Care – PPO | Admitting: General Surgery

## 2013-10-18 VITALS — BP 130/80 | HR 78 | Resp 14 | Ht 62.0 in | Wt 235.0 lb

## 2013-10-18 DIAGNOSIS — C50919 Malignant neoplasm of unspecified site of unspecified female breast: Secondary | ICD-10-CM

## 2013-10-18 DIAGNOSIS — C50911 Malignant neoplasm of unspecified site of right female breast: Secondary | ICD-10-CM

## 2013-10-18 NOTE — Progress Notes (Signed)
Patient ID: Felicia Kidd, female   DOB: 1960/12/14, 53 y.o.   MRN: [id number]  Chief Complaint  Patient presents with  . Routine Post Op    HPI Felicia Kidd is a 53 y.o. female here today following up right breast lumpectomy. The procedure was performed on 09/27/13. The patient report she has had a little drainage from the wide excision site at the 6 o'clock position of the right breast. Patient had her drained removed on 10/13/13.  She is tolerated her ciprofloxacin antibiotic therapy well.   HPI  Past Medical History  Diagnosis Date  . Diabetes mellitus without complication   . Hyperlipidemia   . Hypertension   . Thyroid disease   . 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    Past Surgical History  Procedure Laterality Date  . Dilation and curettage of uterus      Dr Vernie Ammons  . Breast surgery Right 09-27-13    Wide excision of Right breast lesion with axillary dissection    No family history on file.  Social History History  Substance Use Topics  . Smoking status: Never Smoker   . Smokeless tobacco: Never Used  . Alcohol Use: No    No Known Allergies  Current Outpatient Prescriptions  Medication Sig Dispense Refill  . acetaminophen (TYLENOL) 325 MG tablet Take 650 mg by mouth every 6 (six) hours as needed.      Marland Kitchen atorvastatin (LIPITOR) 40 MG tablet Take 40 mg by mouth daily.      . ciprofloxacin (CIPRO) 500 MG tablet Take 1 tablet (500 mg total) by mouth 2 (two) times daily.  20 tablet  0  . fluocinonide ointment (LIDEX) 0.05 % Apply topically as needed.       . fluticasone (FLONASE) 50 MCG/ACT nasal spray Place 2 sprays into both nostrils daily.      . Halcinonide (HALOG) 0.1 % CREA Apply 1 Tube topically 2 (two) times daily.      Marland Kitchen nystatin-triamcinolone (MYCOLOG II) cream Apply 1 application topically as needed.       Marland Kitchen omeprazole (PRILOSEC) 20 MG capsule Take 20 mg by mouth daily.      Marland Kitchen  telmisartan (MICARDIS) 80 MG tablet Take 80 mg by mouth daily.      Marland Kitchen triamcinolone (KENALOG) 0.025 % cream Apply 1 application topically as needed.        No current facility-administered medications for this visit.    Review of Systems Review of Systems  Constitutional: Negative.   Respiratory: Negative.   Cardiovascular: Negative.     Blood pressure 130/80, pulse 78, resp. rate 14, height 5\' 2"  (1.575 m), weight 235 lb (106.595 kg).  Physical Exam Physical Exam  Constitutional: She is oriented to person, place, and time. She appears well-developed and well-nourished.  Pulmonary/Chest:    Right breast lumpectomy   Neurological: She is alert and oriented to person, place, and time.  Skin: Skin is warm and dry.       Assessment    Rapid resolution of drain site infection with drain removal and initiation of oral antibiotics.     Plan    The patient will keep a clean pad over the wide excision site showed more drainage recur. Plans at present are for power port placement on August 19. The patient will be getting her first chemotherapy the following day. She was asked to remind me to cannulate the port at  the end of the procedure to minimize discomfort with her first chemotherapy treatment.     PCP: Despina Arias 10/18/2013, 10:12 PM

## 2013-10-18 NOTE — Patient Instructions (Signed)
Patient to return  

## 2013-10-20 ENCOUNTER — Ambulatory Visit: Payer: BC Managed Care – PPO | Admitting: General Surgery

## 2013-10-26 ENCOUNTER — Ambulatory Visit (INDEPENDENT_AMBULATORY_CARE_PROVIDER_SITE_OTHER): Payer: BC Managed Care – PPO | Admitting: General Surgery

## 2013-10-26 ENCOUNTER — Other Ambulatory Visit: Payer: BC Managed Care – PPO

## 2013-10-26 ENCOUNTER — Encounter: Payer: Self-pay | Admitting: General Surgery

## 2013-10-26 VITALS — BP 142/82 | HR 76 | Temp 97.6°F | Resp 14 | Ht 62.0 in | Wt 238.0 lb

## 2013-10-26 DIAGNOSIS — C50919 Malignant neoplasm of unspecified site of unspecified female breast: Secondary | ICD-10-CM

## 2013-10-26 DIAGNOSIS — N61 Mastitis without abscess: Secondary | ICD-10-CM

## 2013-10-26 DIAGNOSIS — C50911 Malignant neoplasm of unspecified site of right female breast: Secondary | ICD-10-CM

## 2013-10-26 NOTE — Progress Notes (Signed)
Patient ID: Felicia Kidd, female   DOB: 1960/11/18, 53 y.o.   MRN: [id number]  Chief Complaint  Patient presents with  . Pre-op Exam    port placement    HPI Felicia Kidd is a 53 y.o. female here today for her pre op port placement. Her medical oncologist reported yesterday faint erythema overlying the breast for which she initiated Levaquin therapy. As she was scheduled for port placement tomorrow, she was asked to come in for early assessment. The patient has been randomized to a research protocol, and therapy must start by August 31. HPI  Past Medical History  Diagnosis Date  . Diabetes mellitus without complication   . Hyperlipidemia   . Hypertension   . Thyroid disease   . 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    Past Surgical History  Procedure Laterality Date  . Dilation and curettage of uterus      Dr Vernie Ammons  . Breast surgery Right 09-27-13    Wide excision of Right breast lesion with axillary dissection    No family history on file.  Social History History  Substance Use Topics  . Smoking status: Never Smoker   . Smokeless tobacco: Never Used  . Alcohol Use: No    No Known Allergies  Current Outpatient Prescriptions  Medication Sig Dispense Refill  . acetaminophen (TYLENOL) 325 MG tablet Take 650 mg by mouth every 6 (six) hours as needed.      Marland Kitchen atorvastatin (LIPITOR) 40 MG tablet Take 40 mg by mouth daily.      . ciprofloxacin (CIPRO) 500 MG tablet Take 1 tablet (500 mg total) by mouth 2 (two) times daily.  20 tablet  0  . fluocinonide ointment (LIDEX) 0.05 % Apply topically as needed.       . fluticasone (FLONASE) 50 MCG/ACT nasal spray Place 2 sprays into both nostrils daily.      . Halcinonide (HALOG) 0.1 % CREA Apply 1 Tube topically 2 (two) times daily.      Marland Kitchen nystatin-triamcinolone (MYCOLOG II) cream Apply 1 application topically as needed.       Marland Kitchen omeprazole (PRILOSEC) 20 MG  capsule Take 20 mg by mouth daily.      Marland Kitchen telmisartan (MICARDIS) 80 MG tablet Take 80 mg by mouth daily.      Marland Kitchen triamcinolone (KENALOG) 0.025 % cream Apply 1 application topically as needed.       . metFORMIN (GLUCOPHAGE) 500 MG tablet Take 1 tablet by mouth daily at 6 (six) AM.       No current facility-administered medications for this visit.    Review of Systems Review of Systems  Constitutional: Negative.   Respiratory: Negative.   Cardiovascular: Negative.     Blood pressure 142/82, pulse 76, temperature 97.6 F (36.4 C), resp. rate 14, height 5\' 2"  (1.575 m), weight 238 lb (107.956 kg).  Physical Exam Physical Exam  Pulmonary/Chest:  Faint erythema without significant warmth over the breast. Minimal skin edema.  Slight separation of the midportion of the wide excision site at 6:00. No significant erythema along the wound.  Right axillary incision is well-healed. No evidence of recurrent inflammation.    Data Reviewed Ultrasound examination was undertaken of the 6 o'clock position of the right breast. This showed a 1.1 x 3.3 x 3.55 cm fluid collection. The patient was amenable to aspiration. This was completed using 1 cc of 1% plain Xylocaine after ChloraPrep  skin application. The area was aspirated with the approximately 50% decrease in volume. The small amount of pale yellow/straw colored fluidor was sent for culture.  Assessment    Possible breast infection versus cellulitis versus reaction to mastoplasty dissection.     Plan    In discussing with her oncologist, we'll postpone her surgery for one week. She'll continue her present Levaquin therapy pending culture results. Arrangements have been made for port placement one week from tomorrow.     PCP: Despina Arias 10/26/2013, 9:53 PM

## 2013-10-26 NOTE — Patient Instructions (Signed)
Follow up appointment

## 2013-10-31 LAB — ANAEROBIC AND AEROBIC CULTURE

## 2013-11-01 ENCOUNTER — Ambulatory Visit (INDEPENDENT_AMBULATORY_CARE_PROVIDER_SITE_OTHER): Payer: Self-pay | Admitting: General Surgery

## 2013-11-01 ENCOUNTER — Encounter: Payer: Self-pay | Admitting: General Surgery

## 2013-11-01 VITALS — BP 138/82 | HR 74 | Resp 12 | Ht 62.0 in | Wt 239.0 lb

## 2013-11-01 DIAGNOSIS — C50911 Malignant neoplasm of unspecified site of right female breast: Secondary | ICD-10-CM

## 2013-11-01 DIAGNOSIS — L03818 Cellulitis of other sites: Secondary | ICD-10-CM | POA: Insufficient documentation

## 2013-11-01 DIAGNOSIS — L02818 Cutaneous abscess of other sites: Secondary | ICD-10-CM

## 2013-11-01 DIAGNOSIS — C50919 Malignant neoplasm of unspecified site of unspecified female breast: Secondary | ICD-10-CM

## 2013-11-01 NOTE — Progress Notes (Signed)
Patient ID: Felicia Kidd, female   DOB: 02/25/1961, 53 y.o.   MRN: [id number]  Chief Complaint  Patient presents with  . Pre-op Exam    HPI Felicia Kidd is a 53 y.o. female.here today for her pre op port placement scheduled for 11/02/13. Tolerated Levaquin.  No pain. No recent drainage.   HPI  Past Medical History  Diagnosis Date  . Diabetes mellitus without complication   . Hyperlipidemia   . Hypertension   . Thyroid disease   . 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    Past Surgical History  Procedure Laterality Date  . Dilation and curettage of uterus      Dr Vernie Ammons  . Breast surgery Right 09-27-13    Wide excision of Right breast lesion with axillary dissection    No family history on file.  Social History History  Substance Use Topics  . Smoking status: Never Smoker   . Smokeless tobacco: Never Used  . Alcohol Use: No    No Known Allergies  Current Outpatient Prescriptions  Medication Sig Dispense Refill  . acetaminophen (TYLENOL) 325 MG tablet Take 650 mg by mouth every 6 (six) hours as needed.      Marland Kitchen atorvastatin (LIPITOR) 40 MG tablet Take 40 mg by mouth daily.      . ciprofloxacin (CIPRO) 500 MG tablet Take 1 tablet (500 mg total) by mouth 2 (two) times daily.  20 tablet  0  . fluocinonide ointment (LIDEX) 0.05 % Apply topically as needed.       . fluticasone (FLONASE) 50 MCG/ACT nasal spray Place 2 sprays into both nostrils daily.      . Halcinonide (HALOG) 0.1 % CREA Apply 1 Tube topically 2 (two) times daily.      . metFORMIN (GLUCOPHAGE) 500 MG tablet Take 1 tablet by mouth daily at 6 (six) AM.      . nystatin-triamcinolone (MYCOLOG II) cream Apply 1 application topically as needed.       Marland Kitchen omeprazole (PRILOSEC) 20 MG capsule Take 20 mg by mouth daily.      Marland Kitchen telmisartan (MICARDIS) 80 MG tablet Take 80 mg by mouth daily.      Marland Kitchen triamcinolone (KENALOG) 0.025 % cream Apply 1  application topically as needed.        No current facility-administered medications for this visit.    Review of Systems Review of Systems  Constitutional: Negative.   Respiratory: Negative.   Cardiovascular: Negative.     Blood pressure 138/82, pulse 74, resp. rate 12, height 5\' 2"  (1.575 m), weight 239 lb (108.41 kg).  Physical Exam Physical Exam  Constitutional: She is oriented to person, place, and time. She appears well-developed and well-nourished.  Eyes: Conjunctivae are normal. No scleral icterus.  Neck: Neck supple.  Cardiovascular: Normal rate, regular rhythm and normal heart sounds.   Pulmonary/Chest: Effort normal and breath sounds normal.    Faint redness mild thickening right breast  Neurological: She is alert and oriented to person, place, and time.  Skin: Skin is warm and dry.       Assessment    Resolution of breast cellulitis.       Plan    Will proceed w/ port placement in AM.        PCP: Despina Arias 11/01/2013, 8:53 AM

## 2013-11-01 NOTE — Patient Instructions (Signed)
Patient scheduled for surgery 11/02/13

## 2013-11-02 ENCOUNTER — Ambulatory Visit: Payer: Self-pay | Admitting: General Surgery

## 2013-11-02 ENCOUNTER — Encounter: Payer: Self-pay | Admitting: General Surgery

## 2013-11-02 DIAGNOSIS — C50919 Malignant neoplasm of unspecified site of unspecified female breast: Secondary | ICD-10-CM

## 2013-11-02 DIAGNOSIS — L03818 Cellulitis of other sites: Secondary | ICD-10-CM | POA: Insufficient documentation

## 2013-11-02 HISTORY — PX: PORTACATH PLACEMENT: SHX2246

## 2013-11-04 ENCOUNTER — Encounter: Payer: Self-pay | Admitting: General Surgery

## 2013-11-04 LAB — COMPREHENSIVE METABOLIC PANEL
ALBUMIN: 3.1 g/dL — AB (ref 3.4–5.0)
Alkaline Phosphatase: 129 U/L — ABNORMAL HIGH
Anion Gap: 9 (ref 7–16)
BUN: 11 mg/dL (ref 7–18)
Bilirubin,Total: 0.5 mg/dL (ref 0.2–1.0)
CALCIUM: 8.4 mg/dL — AB (ref 8.5–10.1)
Chloride: 100 mmol/L (ref 98–107)
Co2: 29 mmol/L (ref 21–32)
Creatinine: 0.78 mg/dL (ref 0.60–1.30)
EGFR (African American): >60
EGFR (Non-African Amer.): >60
Glucose: 115 mg/dL — ABNORMAL HIGH (ref 65–99)
Osmolality: 276 (ref 275–301)
Potassium: 3.7 mmol/L (ref 3.5–5.1)
SGOT(AST): 16 U/L (ref 15–37)
SGPT (ALT): 25 U/L
Sodium: 138 mmol/L (ref 136–145)
TOTAL PROTEIN: 6.4 g/dL (ref 6.4–8.2)

## 2013-11-04 LAB — CBC CANCER CENTER
BASOS ABS: 0.1 x10 3/mm (ref 0.0–0.1)
BASOS PCT: 1.2 %
Eosinophil #: 0.4 x10 3/mm (ref 0.0–0.7)
Eosinophil %: 3.4 %
HCT: 37 % (ref 35.0–47.0)
HGB: 12.5 g/dL (ref 12.0–16.0)
Lymphocyte #: 2.9 x10 3/mm (ref 1.0–3.6)
Lymphocyte %: 26.7 %
MCH: 30.2 pg (ref 26.0–34.0)
MCHC: 33.7 g/dL (ref 32.0–36.0)
MCV: 90 fL (ref 80–100)
Monocyte #: 0.7 x10 3/mm (ref 0.2–0.9)
Monocyte %: 6.7 %
Neutrophil #: 6.8 x10 3/mm — ABNORMAL HIGH (ref 1.4–6.5)
Neutrophil %: 62 %
PLATELETS: 295 x10 3/mm (ref 150–440)
RBC: 4.13 10*6/uL (ref 3.80–5.20)
RDW: 12.6 % (ref 11.5–14.5)
WBC: 11 x10 3/mm (ref 3.6–11.0)

## 2013-11-09 ENCOUNTER — Ambulatory Visit: Payer: Self-pay | Admitting: Oncology

## 2013-11-11 LAB — CBC CANCER CENTER
BASOS ABS: 0.1 x10 3/mm (ref 0.0–0.1)
BASOS PCT: 1 %
EOS ABS: 0.4 x10 3/mm (ref 0.0–0.7)
EOS PCT: 5.3 %
HCT: 37.5 % (ref 35.0–47.0)
HGB: 12.5 g/dL (ref 12.0–16.0)
LYMPHS PCT: 33.4 %
Lymphocyte #: 2.3 x10 3/mm (ref 1.0–3.6)
MCH: 30.1 pg (ref 26.0–34.0)
MCHC: 33.3 g/dL (ref 32.0–36.0)
MCV: 90 fL (ref 80–100)
MONO ABS: 0.4 x10 3/mm (ref 0.2–0.9)
MONOS PCT: 6.5 %
NEUTROS ABS: 3.6 x10 3/mm (ref 1.4–6.5)
NEUTROS PCT: 53.8 %
Platelet: 240 x10 3/mm (ref 150–440)
RBC: 4.15 10*6/uL (ref 3.80–5.20)
RDW: 12.4 % (ref 11.5–14.5)
WBC: 6.8 x10 3/mm (ref 3.6–11.0)

## 2013-11-17 ENCOUNTER — Encounter: Payer: Self-pay | Admitting: General Surgery

## 2013-11-17 ENCOUNTER — Ambulatory Visit (INDEPENDENT_AMBULATORY_CARE_PROVIDER_SITE_OTHER): Payer: Self-pay | Admitting: General Surgery

## 2013-11-17 VITALS — BP 124/80 | HR 78 | Resp 12 | Ht 62.0 in | Wt 233.0 lb

## 2013-11-17 DIAGNOSIS — C50919 Malignant neoplasm of unspecified site of unspecified female breast: Secondary | ICD-10-CM

## 2013-11-17 DIAGNOSIS — C50911 Malignant neoplasm of unspecified site of right female breast: Secondary | ICD-10-CM

## 2013-11-17 NOTE — Progress Notes (Signed)
Patient ID: Felicia Kidd, female   DOB: 02-19-1961, 53 y.o.   MRN: [id number]  Chief Complaint  Patient presents with  . Routine Post Op    port placement    HPI Felicia Kidd is a 53 y.o. female.  Here today for her postoperative visit, port placement on 11-02-13. States she is doing well. Next chemotherapy is planned tomorrow.   HPI  Past Medical History  Diagnosis Date  . Diabetes mellitus without complication   . Hyperlipidemia   . Hypertension   . Thyroid disease   . 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    Past Surgical History  Procedure Laterality Date  . Dilation and curettage of uterus      Dr Vernie Ammons  . Breast surgery Right 09-27-13    Wide excision of Right breast lesion with axillary dissection  . Portacath placement  11-02-13    No family history on file.  Social History History  Substance Use Topics  . Smoking status: Never Smoker   . Smokeless tobacco: Never Used  . Alcohol Use: No    No Known Allergies  Current Outpatient Prescriptions  Medication Sig Dispense Refill  . acetaminophen (TYLENOL) 325 MG tablet Take 650 mg by mouth every 6 (six) hours as needed.      Marland Kitchen atorvastatin (LIPITOR) 40 MG tablet Take 40 mg by mouth daily.      . fluocinonide ointment (LIDEX) 0.05 % Apply topically as needed.       . fluticasone (FLONASE) 50 MCG/ACT nasal spray Place 2 sprays into both nostrils daily.      . metFORMIN (GLUCOPHAGE) 500 MG tablet Take 1 tablet by mouth daily at 6 (six) AM.      . nystatin-triamcinolone (MYCOLOG II) cream Apply 1 application topically as needed.       Marland Kitchen omeprazole (PRILOSEC) 20 MG capsule Take 20 mg by mouth daily.      . ondansetron (ZOFRAN) 4 MG tablet Take 4 mg by mouth every 8 (eight) hours as needed.       Marland Kitchen telmisartan-hydrochlorothiazide (MICARDIS HCT) 80-25 MG per tablet Take 1 tablet by mouth daily.       . Halcinonide (HALOG) 0.1 % CREA Apply 1 Tube  topically 2 (two) times daily.      Marland Kitchen triamcinolone (KENALOG) 0.025 % cream Apply 1 application topically as needed.        No current facility-administered medications for this visit.    Review of Systems Review of Systems  Constitutional: Negative.   Respiratory: Negative.   Cardiovascular: Negative.     Blood pressure 124/80, pulse 78, resp. rate 12, height 5\' 2"  (1.575 m), weight 233 lb (105.688 kg).  Physical Exam Physical Exam  Constitutional: She is oriented to person, place, and time. She appears well-developed and well-nourished.  Pulmonary/Chest:    Port in place left chest. Right breast with residual redness.  Musculoskeletal:       Arms: Neurological: She is alert and oriented to person, place, and time.  Skin: Skin is warm and dry.      Assessment    Doing well with adjuvant chemotherapy. Continued progress of right breast wound. Mild upper extremity asymmetry working close followup.    Plan    Followup examination in 2 months.     PCP: Glendon Axe Dr. Zenia Resides, [place] Gleason 11/17/2013, 9:39 AM

## 2013-11-17 NOTE — Patient Instructions (Signed)
Continue self breast exams. Call office for any new breast issues or concerns. 

## 2013-11-18 LAB — CBC CANCER CENTER
BASOS ABS: 0.2 x10 3/mm — AB (ref 0.0–0.1)
BASOS PCT: 0.6 %
Eosinophil #: 0.2 x10 3/mm (ref 0.0–0.7)
Eosinophil %: 0.7 %
HCT: 37.6 % (ref 35.0–47.0)
HGB: 12.4 g/dL (ref 12.0–16.0)
LYMPHS ABS: 3.6 x10 3/mm (ref 1.0–3.6)
LYMPHS PCT: 12.8 %
MCH: 29.4 pg (ref 26.0–34.0)
MCHC: 33 g/dL (ref 32.0–36.0)
MCV: 89 fL (ref 80–100)
MONOS PCT: 4.2 %
Monocyte #: 1.2 x10 3/mm — ABNORMAL HIGH (ref 0.2–0.9)
NEUTROS PCT: 81.7 %
Neutrophil #: 22.8 x10 3/mm — ABNORMAL HIGH (ref 1.4–6.5)
PLATELETS: 224 x10 3/mm (ref 150–440)
RBC: 4.22 10*6/uL (ref 3.80–5.20)
RDW: 12.4 % (ref 11.5–14.5)
WBC: 27.8 x10 3/mm — ABNORMAL HIGH (ref 3.6–11.0)

## 2013-11-18 LAB — COMPREHENSIVE METABOLIC PANEL
ALK PHOS: 199 U/L — AB
ALT: 22 U/L
ANION GAP: 11 (ref 7–16)
AST: 13 U/L — AB (ref 15–37)
Albumin: 3.3 g/dL — ABNORMAL LOW (ref 3.4–5.0)
BUN: 19 mg/dL — ABNORMAL HIGH (ref 7–18)
Bilirubin,Total: 0.3 mg/dL (ref 0.2–1.0)
CALCIUM: 8.8 mg/dL (ref 8.5–10.1)
CHLORIDE: 99 mmol/L (ref 98–107)
CREATININE: 0.85 mg/dL (ref 0.60–1.30)
Co2: 26 mmol/L (ref 21–32)
EGFR (African American): >60
EGFR (Non-African Amer.): >60
GLUCOSE: 143 mg/dL — AB (ref 65–99)
Osmolality: 277 (ref 275–301)
POTASSIUM: 3.6 mmol/L (ref 3.5–5.1)
SODIUM: 136 mmol/L (ref 136–145)
Total Protein: 6.7 g/dL (ref 6.4–8.2)

## 2013-11-24 LAB — CBC CANCER CENTER
Basophil #: 0 x10 3/mm (ref 0.0–0.1)
Basophil %: 0.5 %
EOS ABS: 0.1 x10 3/mm (ref 0.0–0.7)
Eosinophil %: 0.6 %
HCT: 36.1 % (ref 35.0–47.0)
HGB: 12.2 g/dL (ref 12.0–16.0)
LYMPHS PCT: 13.9 %
Lymphocyte #: 1.3 x10 3/mm (ref 1.0–3.6)
MCH: 30.2 pg (ref 26.0–34.0)
MCHC: 33.9 g/dL (ref 32.0–36.0)
MCV: 89 fL (ref 80–100)
MONO ABS: 0.2 x10 3/mm (ref 0.2–0.9)
MONOS PCT: 1.8 %
Neutrophil #: 7.6 x10 3/mm — ABNORMAL HIGH (ref 1.4–6.5)
Neutrophil %: 83.2 %
Platelet: 233 x10 3/mm (ref 150–440)
RBC: 4.05 10*6/uL (ref 3.80–5.20)
RDW: 12.4 % (ref 11.5–14.5)
WBC: 9.1 x10 3/mm (ref 3.6–11.0)

## 2013-12-01 LAB — COMPREHENSIVE METABOLIC PANEL
AST: 19 U/L (ref 15–37)
Albumin: 3.3 g/dL — ABNORMAL LOW (ref 3.4–5.0)
Alkaline Phosphatase: 197 U/L — ABNORMAL HIGH
Anion Gap: 8 (ref 7–16)
BUN: 13 mg/dL (ref 7–18)
Bilirubin,Total: 0.2 mg/dL (ref 0.2–1.0)
Calcium, Total: 9.1 mg/dL (ref 8.5–10.1)
Chloride: 98 mmol/L (ref 98–107)
Co2: 29 mmol/L (ref 21–32)
Creatinine: 0.75 mg/dL (ref 0.60–1.30)
EGFR (African American): >60
GLUCOSE: 103 mg/dL — AB (ref 65–99)
Osmolality: 270 (ref 275–301)
Potassium: 3.5 mmol/L (ref 3.5–5.1)
SGPT (ALT): 26 U/L
SODIUM: 135 mmol/L — AB (ref 136–145)
Total Protein: 6.3 g/dL — ABNORMAL LOW (ref 6.4–8.2)

## 2013-12-01 LAB — CBC CANCER CENTER
BASOS ABS: 0.3 x10 3/mm — AB (ref 0.0–0.1)
Basophil %: 0.6 %
EOS ABS: 0.2 x10 3/mm (ref 0.0–0.7)
EOS PCT: 0.5 %
HCT: 34.5 % — ABNORMAL LOW (ref 35.0–47.0)
HGB: 11.3 g/dL — ABNORMAL LOW (ref 12.0–16.0)
LYMPHS ABS: 3.3 x10 3/mm (ref 1.0–3.6)
Lymphocyte %: 7 %
MCH: 29 pg (ref 26.0–34.0)
MCHC: 32.8 g/dL (ref 32.0–36.0)
MCV: 88 fL (ref 80–100)
Monocyte #: 1.1 x10 3/mm — ABNORMAL HIGH (ref 0.2–0.9)
Monocyte %: 2.4 %
NEUTROS ABS: 43 x10 3/mm — AB (ref 1.4–6.5)
Neutrophil %: 89.5 %
PLATELETS: 322 x10 3/mm (ref 150–440)
RBC: 3.9 10*6/uL (ref 3.80–5.20)
RDW: 12.7 % (ref 11.5–14.5)
WBC: 47.9 x10 3/mm — AB (ref 3.6–11.0)

## 2013-12-02 LAB — URINALYSIS, COMPLETE
BILIRUBIN, UR: NEGATIVE
BLOOD: NEGATIVE
Glucose,UR: NEGATIVE mg/dL (ref 0–75)
Ketone: NEGATIVE
LEUKOCYTE ESTERASE: NEGATIVE
Nitrite: NEGATIVE
PH: 7 (ref 4.5–8.0)
Protein: NEGATIVE
RBC,UR: 1 /HPF (ref 0–5)
Specific Gravity: 1.004 (ref 1.003–1.030)

## 2013-12-05 LAB — URINE CULTURE

## 2013-12-08 LAB — CBC CANCER CENTER
Basophil #: 0 x10 3/mm (ref 0.0–0.1)
Basophil %: 0.8 %
EOS ABS: 0.1 x10 3/mm (ref 0.0–0.7)
EOS PCT: 4.3 %
HCT: 30.9 % — AB (ref 35.0–47.0)
HGB: 10.3 g/dL — AB (ref 12.0–16.0)
LYMPHS ABS: 0.8 x10 3/mm — AB (ref 1.0–3.6)
Lymphocyte %: 28.7 %
MCH: 29.9 pg (ref 26.0–34.0)
MCHC: 33.4 g/dL (ref 32.0–36.0)
MCV: 89 fL (ref 80–100)
MONOS PCT: 6.9 %
Monocyte #: 0.2 x10 3/mm (ref 0.2–0.9)
Neutrophil #: 1.6 x10 3/mm (ref 1.4–6.5)
Neutrophil %: 59.3 %
Platelet: 108 x10 3/mm — ABNORMAL LOW (ref 150–440)
RBC: 3.45 10*6/uL — AB (ref 3.80–5.20)
RDW: 12.4 % (ref 11.5–14.5)
WBC: 2.7 x10 3/mm — ABNORMAL LOW (ref 3.6–11.0)

## 2013-12-09 ENCOUNTER — Ambulatory Visit: Payer: Self-pay | Admitting: Oncology

## 2013-12-15 LAB — CBC CANCER CENTER
BANDS NEUTROPHIL: 12 %
Eosinophil: 2 %
HCT: 31.2 % — AB (ref 35.0–47.0)
HGB: 10.2 g/dL — ABNORMAL LOW (ref 12.0–16.0)
LYMPHS PCT: 6 %
MCH: 29.3 pg (ref 26.0–34.0)
MCHC: 32.7 g/dL (ref 32.0–36.0)
MCV: 90 fL (ref 80–100)
METAMYELOCYTE: 9 %
MONOS PCT: 3 %
MYELOCYTE: 8 %
NRBC/100 WBC: 1 /100
Other Cells Blood: 2 %
PLATELETS: 337 x10 3/mm (ref 150–440)
Promyelocyte: 1 %
RBC: 3.49 10*6/uL — AB (ref 3.80–5.20)
RDW: 13.3 % (ref 11.5–14.5)
SEGMENTED NEUTROPHILS: 57 %
WBC: 40 x10 3/mm — AB (ref 3.6–11.0)

## 2013-12-15 LAB — COMPREHENSIVE METABOLIC PANEL
ALBUMIN: 3.2 g/dL — AB (ref 3.4–5.0)
ALT: 24 U/L
ANION GAP: 9 (ref 7–16)
Alkaline Phosphatase: 189 U/L — ABNORMAL HIGH
BUN: 9 mg/dL (ref 7–18)
Bilirubin,Total: 0.2 mg/dL (ref 0.2–1.0)
Calcium, Total: 8.7 mg/dL (ref 8.5–10.1)
Chloride: 102 mmol/L (ref 98–107)
Co2: 28 mmol/L (ref 21–32)
Creatinine: 0.71 mg/dL (ref 0.60–1.30)
EGFR (African American): >60
EGFR (Non-African Amer.): >60
Glucose: 130 mg/dL — ABNORMAL HIGH (ref 65–99)
Osmolality: 278 (ref 275–301)
Potassium: 3.1 mmol/L — ABNORMAL LOW (ref 3.5–5.1)
SGOT(AST): 14 U/L — ABNORMAL LOW (ref 15–37)
Sodium: 139 mmol/L (ref 136–145)
Total Protein: 6.1 g/dL — ABNORMAL LOW (ref 6.4–8.2)

## 2013-12-22 LAB — CBC CANCER CENTER
BASOS PCT: 0.7 %
Basophil #: 0 x10 3/mm (ref 0.0–0.1)
EOS ABS: 0 x10 3/mm (ref 0.0–0.7)
Eosinophil %: 1 %
HCT: 29.2 % — ABNORMAL LOW (ref 35.0–47.0)
HGB: 10 g/dL — ABNORMAL LOW (ref 12.0–16.0)
Lymphocyte #: 0.6 x10 3/mm — ABNORMAL LOW (ref 1.0–3.6)
Lymphocyte %: 12.3 %
MCH: 30.5 pg (ref 26.0–34.0)
MCHC: 34.3 g/dL (ref 32.0–36.0)
MCV: 89 fL (ref 80–100)
MONO ABS: 0.2 x10 3/mm (ref 0.2–0.9)
MONOS PCT: 3.9 %
NEUTROS PCT: 82.1 %
Neutrophil #: 4.1 x10 3/mm (ref 1.4–6.5)
PLATELETS: 201 x10 3/mm (ref 150–440)
RBC: 3.28 10*6/uL — ABNORMAL LOW (ref 3.80–5.20)
RDW: 13.1 % (ref 11.5–14.5)
WBC: 5 x10 3/mm (ref 3.6–11.0)

## 2013-12-29 LAB — CBC CANCER CENTER
BASOS ABS: 0.2 x10 3/mm — AB (ref 0.0–0.1)
BASOS PCT: 0.5 %
EOS ABS: 0.2 x10 3/mm (ref 0.0–0.7)
Eosinophil %: 0.4 %
HCT: 33 % — ABNORMAL LOW (ref 35.0–47.0)
HGB: 10.7 g/dL — ABNORMAL LOW (ref 12.0–16.0)
Lymphocyte #: 2.1 x10 3/mm (ref 1.0–3.6)
Lymphocyte %: 5.3 %
MCH: 29.4 pg (ref 26.0–34.0)
MCHC: 32.3 g/dL (ref 32.0–36.0)
MCV: 91 fL (ref 80–100)
MONO ABS: 1 x10 3/mm — AB (ref 0.2–0.9)
Monocyte %: 2.4 %
NEUTROS PCT: 91.4 %
Neutrophil #: 36.5 x10 3/mm — ABNORMAL HIGH (ref 1.4–6.5)
PLATELETS: 282 x10 3/mm (ref 150–440)
RBC: 3.63 10*6/uL — ABNORMAL LOW (ref 3.80–5.20)
RDW: 14.1 % (ref 11.5–14.5)
WBC: 40 x10 3/mm — ABNORMAL HIGH (ref 3.6–11.0)

## 2014-01-05 LAB — COMPREHENSIVE METABOLIC PANEL
ALBUMIN: 3.3 g/dL — AB (ref 3.4–5.0)
ALK PHOS: 134 U/L — AB
ALT: 36 U/L
ANION GAP: 9 (ref 7–16)
AST: 18 U/L (ref 15–37)
BILIRUBIN TOTAL: 0.3 mg/dL (ref 0.2–1.0)
BUN: 9 mg/dL (ref 7–18)
CALCIUM: 8.7 mg/dL (ref 8.5–10.1)
Chloride: 102 mmol/L (ref 98–107)
Co2: 28 mmol/L (ref 21–32)
Creatinine: 0.69 mg/dL (ref 0.60–1.30)
EGFR (African American): >60
EGFR (Non-African Amer.): >60
Glucose: 122 mg/dL — ABNORMAL HIGH (ref 65–99)
Osmolality: 278 (ref 275–301)
POTASSIUM: 3.6 mmol/L (ref 3.5–5.1)
Sodium: 139 mmol/L (ref 136–145)
TOTAL PROTEIN: 6.2 g/dL — AB (ref 6.4–8.2)

## 2014-01-05 LAB — CBC CANCER CENTER
Basophil #: 0.1 x10 3/mm (ref 0.0–0.1)
Basophil %: 0.4 %
EOS PCT: 1.2 %
Eosinophil #: 0.2 x10 3/mm (ref 0.0–0.7)
HCT: 30.9 % — ABNORMAL LOW (ref 35.0–47.0)
HGB: 10 g/dL — ABNORMAL LOW (ref 12.0–16.0)
Lymphocyte #: 1.4 x10 3/mm (ref 1.0–3.6)
Lymphocyte %: 9.4 %
MCH: 29.9 pg (ref 26.0–34.0)
MCHC: 32.5 g/dL (ref 32.0–36.0)
MCV: 92 fL (ref 80–100)
MONO ABS: 1 x10 3/mm — AB (ref 0.2–0.9)
MONOS PCT: 7.1 %
Neutrophil #: 11.9 x10 3/mm — ABNORMAL HIGH (ref 1.4–6.5)
Neutrophil %: 81.9 %
PLATELETS: 404 x10 3/mm (ref 150–440)
RBC: 3.36 10*6/uL — AB (ref 3.80–5.20)
RDW: 16.7 % — ABNORMAL HIGH (ref 11.5–14.5)
WBC: 14.5 x10 3/mm — AB (ref 3.6–11.0)

## 2014-01-09 ENCOUNTER — Ambulatory Visit: Payer: Self-pay | Admitting: Oncology

## 2014-01-10 ENCOUNTER — Ambulatory Visit (INDEPENDENT_AMBULATORY_CARE_PROVIDER_SITE_OTHER): Payer: Self-pay | Admitting: General Surgery

## 2014-01-10 ENCOUNTER — Encounter: Payer: Self-pay | Admitting: General Surgery

## 2014-01-10 VITALS — BP 124/76 | HR 76 | Resp 12 | Ht 62.0 in | Wt 236.0 lb

## 2014-01-10 DIAGNOSIS — C50911 Malignant neoplasm of unspecified site of right female breast: Secondary | ICD-10-CM

## 2014-01-10 NOTE — Patient Instructions (Signed)
Patient to return in 3 months

## 2014-01-10 NOTE — Progress Notes (Signed)
Patient ID: Felicia Kidd, female   DOB: [birth year], 53 y.o.   MRN: [id number]  Chief Complaint  Patient presents with  . Follow-up    breast cancer    HPI Felicia Kidd is a 53 y.o. female here today for her two month follow up breast cancer. Patient states she is doing well. Rightt breast wide excision done on 09/27/13.  HPI  Past Medical History  Diagnosis Date  . Diabetes mellitus without complication   . Hyperlipidemia   . Hypertension   . Thyroid disease   . 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    Past Surgical History  Procedure Laterality Date  . Dilation and curettage of uterus      Dr Vernie Ammons  . Breast surgery Right 09-27-13    Wide excision of Right breast lesion with axillary dissection  . Portacath placement  11-02-13    No family history on file.  Social History History  Substance Use Topics  . Smoking status: Never Smoker   . Smokeless tobacco: Never Used  . Alcohol Use: No    No Known Allergies  Current Outpatient Prescriptions  Medication Sig Dispense Refill  . acetaminophen (TYLENOL) 325 MG tablet Take 650 mg by mouth every 6 (six) hours as needed.    Marland Kitchen atorvastatin (LIPITOR) 40 MG tablet Take 40 mg by mouth daily.    . ciprofloxacin (CIPRO) 500 MG tablet   0  . fluocinonide ointment (LIDEX) 0.05 % Apply topically as needed.     . fluticasone (FLONASE) 50 MCG/ACT nasal spray Place 2 sprays into both nostrils daily.    . Halcinonide (HALOG) 0.1 % CREA Apply 1 Tube topically 2 (two) times daily.    . hydrocortisone-pramoxine (ANALPRAM-HC) 2.5-1 % rectal cream   2  . metFORMIN (GLUCOPHAGE) 500 MG tablet Take 1 tablet by mouth daily at 6 (six) AM.    . nystatin-triamcinolone (MYCOLOG II) cream Apply 1 application topically as needed.     Marland Kitchen omeprazole (PRILOSEC) 20 MG capsule Take 20 mg by mouth daily.    . ondansetron (ZOFRAN) 4 MG tablet Take 4 mg by mouth every 8 (eight) hours as  needed.     Marland Kitchen telmisartan-hydrochlorothiazide (MICARDIS HCT) 80-25 MG per tablet Take 1 tablet by mouth daily.     Marland Kitchen triamcinolone (KENALOG) 0.025 % cream Apply 1 application topically as needed.      No current facility-administered medications for this visit.    Review of Systems Review of Systems  Constitutional: Negative.   Respiratory: Negative.   Cardiovascular: Negative.     Blood pressure 124/76, pulse 76, resp. rate 12, height 5\' 2"  (1.575 m), weight 236 lb (107.049 kg).  Physical Exam Physical Exam  Constitutional: She is oriented to person, place, and time. She appears well-developed and well-nourished.  Eyes: Conjunctivae are normal. No scleral icterus.  Cardiovascular: Normal rate, regular rhythm and normal heart sounds.   Pulmonary/Chest: Effort normal and breath sounds normal.    Right breast incision looks clean and well healed 2 cm thickening . Port site is fine.  Neurological: She is alert and oriented to person, place, and time.  Skin: Skin is warm and dry.      Assessment    Doing well post wide excision.     Plan   Patient to return in 3 months.  Robert Bellow 01/11/2014, 8:58 PM

## 2014-01-12 LAB — CBC CANCER CENTER
BASOS PCT: 1.5 %
Basophil #: 0.1 x10 3/mm (ref 0.0–0.1)
EOS ABS: 0.1 x10 3/mm (ref 0.0–0.7)
Eosinophil %: 1.4 %
HCT: 28.9 % — AB (ref 35.0–47.0)
HGB: 9.8 g/dL — AB (ref 12.0–16.0)
LYMPHS ABS: 0.9 x10 3/mm — AB (ref 1.0–3.6)
Lymphocyte %: 14.8 %
MCH: 30.9 pg (ref 26.0–34.0)
MCHC: 33.8 g/dL (ref 32.0–36.0)
MCV: 92 fL (ref 80–100)
MONO ABS: 0.5 x10 3/mm (ref 0.2–0.9)
Monocyte %: 8.7 %
NEUTROS ABS: 4.7 x10 3/mm (ref 1.4–6.5)
Neutrophil %: 73.6 %
Platelet: 303 x10 3/mm (ref 150–440)
RBC: 3.16 10*6/uL — AB (ref 3.80–5.20)
RDW: 16.6 % — ABNORMAL HIGH (ref 11.5–14.5)
WBC: 6.3 x10 3/mm (ref 3.6–11.0)

## 2014-01-19 LAB — CBC CANCER CENTER
BASOS ABS: 0 x10 3/mm (ref 0.0–0.1)
Basophil %: 0.6 %
EOS ABS: 0.2 x10 3/mm (ref 0.0–0.7)
Eosinophil %: 2.8 %
HCT: 28.6 % — AB (ref 35.0–47.0)
HGB: 9.7 g/dL — AB (ref 12.0–16.0)
LYMPHS PCT: 18.8 %
Lymphocyte #: 1 x10 3/mm (ref 1.0–3.6)
MCH: 31.5 pg (ref 26.0–34.0)
MCHC: 33.8 g/dL (ref 32.0–36.0)
MCV: 93 fL (ref 80–100)
MONO ABS: 0.5 x10 3/mm (ref 0.2–0.9)
MONOS PCT: 9.3 %
Neutrophil #: 3.8 x10 3/mm (ref 1.4–6.5)
Neutrophil %: 68.5 %
PLATELETS: 206 x10 3/mm (ref 150–440)
RBC: 3.07 10*6/uL — AB (ref 3.80–5.20)
RDW: 18.2 % — ABNORMAL HIGH (ref 11.5–14.5)
WBC: 5.6 x10 3/mm (ref 3.6–11.0)

## 2014-01-20 LAB — URINE CULTURE

## 2014-01-26 LAB — CBC CANCER CENTER
BASOS ABS: 0 x10 3/mm (ref 0.0–0.1)
BASOS PCT: 0.2 %
EOS PCT: 2.4 %
Eosinophil #: 0.2 x10 3/mm (ref 0.0–0.7)
HCT: 28 % — ABNORMAL LOW (ref 35.0–47.0)
HGB: 9.6 g/dL — AB (ref 12.0–16.0)
Lymphocyte #: 0.8 x10 3/mm — ABNORMAL LOW (ref 1.0–3.6)
Lymphocyte %: 11.9 %
MCH: 32.7 pg (ref 26.0–34.0)
MCHC: 34.2 g/dL (ref 32.0–36.0)
MCV: 96 fL (ref 80–100)
Monocyte #: 0.5 x10 3/mm (ref 0.2–0.9)
Monocyte %: 7.5 %
NEUTROS ABS: 5.6 x10 3/mm (ref 1.4–6.5)
Neutrophil %: 78 %
Platelet: 377 x10 3/mm (ref 150–440)
RBC: 2.94 10*6/uL — ABNORMAL LOW (ref 3.80–5.20)
RDW: 19.6 % — ABNORMAL HIGH (ref 11.5–14.5)
WBC: 7.1 x10 3/mm (ref 3.6–11.0)

## 2014-01-26 LAB — COMPREHENSIVE METABOLIC PANEL
ALT: 34 U/L
ANION GAP: 6 — AB (ref 7–16)
Albumin: 3.2 g/dL — ABNORMAL LOW (ref 3.4–5.0)
Alkaline Phosphatase: 135 U/L — ABNORMAL HIGH
BUN: 12 mg/dL (ref 7–18)
Bilirubin,Total: 0.3 mg/dL (ref 0.2–1.0)
CALCIUM: 8.7 mg/dL (ref 8.5–10.1)
CHLORIDE: 103 mmol/L (ref 98–107)
CO2: 28 mmol/L (ref 21–32)
Creatinine: 0.71 mg/dL (ref 0.60–1.30)
Glucose: 146 mg/dL — ABNORMAL HIGH (ref 65–99)
Osmolality: 276 (ref 275–301)
Potassium: 3.5 mmol/L (ref 3.5–5.1)
SGOT(AST): 14 U/L — ABNORMAL LOW (ref 15–37)
Sodium: 137 mmol/L (ref 136–145)
TOTAL PROTEIN: 6 g/dL — AB (ref 6.4–8.2)

## 2014-01-26 LAB — MAGNESIUM: Magnesium: 1.5 mg/dL — ABNORMAL LOW

## 2014-02-02 LAB — CBC CANCER CENTER
Basophil #: 0.1 x10 3/mm (ref 0.0–0.1)
Basophil %: 1.3 %
Eosinophil #: 0.1 x10 3/mm (ref 0.0–0.7)
Eosinophil %: 2.3 %
HCT: 28.4 % — ABNORMAL LOW (ref 35.0–47.0)
HGB: 9.7 g/dL — ABNORMAL LOW (ref 12.0–16.0)
Lymphocyte #: 1 x10 3/mm (ref 1.0–3.6)
Lymphocyte %: 20.9 %
MCH: 32.7 pg (ref 26.0–34.0)
MCHC: 34.4 g/dL (ref 32.0–36.0)
MCV: 95 fL (ref 80–100)
Monocyte #: 0.3 x10 3/mm (ref 0.2–0.9)
Monocyte %: 6.9 %
Neutrophil #: 3.2 x10 3/mm (ref 1.4–6.5)
Neutrophil %: 68.6 %
Platelet: 304 x10 3/mm (ref 150–440)
RBC: 2.98 10*6/uL — ABNORMAL LOW (ref 3.80–5.20)
RDW: 18.3 % — ABNORMAL HIGH (ref 11.5–14.5)
WBC: 4.7 x10 3/mm (ref 3.6–11.0)

## 2014-02-08 ENCOUNTER — Ambulatory Visit: Payer: Self-pay | Admitting: Oncology

## 2014-02-09 LAB — CBC CANCER CENTER
Basophil #: 0 x10 3/mm (ref 0.0–0.1)
Basophil %: 0.4 %
EOS ABS: 0 x10 3/mm (ref 0.0–0.7)
EOS PCT: 0.9 %
HCT: 27.4 % — ABNORMAL LOW (ref 35.0–47.0)
HGB: 9.5 g/dL — ABNORMAL LOW (ref 12.0–16.0)
LYMPHS ABS: 1.1 x10 3/mm (ref 1.0–3.6)
Lymphocyte %: 20.2 %
MCH: 33.6 pg (ref 26.0–34.0)
MCHC: 34.8 g/dL (ref 32.0–36.0)
MCV: 96 fL (ref 80–100)
Monocyte #: 0.6 x10 3/mm (ref 0.2–0.9)
Monocyte %: 12.2 %
NEUTROS PCT: 66.3 %
Neutrophil #: 3.5 x10 3/mm (ref 1.4–6.5)
PLATELETS: 249 x10 3/mm (ref 150–440)
RBC: 2.84 10*6/uL — ABNORMAL LOW (ref 3.80–5.20)
RDW: 18.3 % — ABNORMAL HIGH (ref 11.5–14.5)
WBC: 5.3 x10 3/mm (ref 3.6–11.0)

## 2014-02-16 LAB — CBC CANCER CENTER
BASOS ABS: 0 x10 3/mm (ref 0.0–0.1)
Basophil %: 0.4 %
EOS ABS: 0.1 x10 3/mm (ref 0.0–0.7)
EOS PCT: 1.6 %
HCT: 27.8 % — ABNORMAL LOW (ref 35.0–47.0)
HGB: 9.6 g/dL — ABNORMAL LOW (ref 12.0–16.0)
LYMPHS ABS: 1.2 x10 3/mm (ref 1.0–3.6)
Lymphocyte %: 16 %
MCH: 33.7 pg (ref 26.0–34.0)
MCHC: 34.4 g/dL (ref 32.0–36.0)
MCV: 98 fL (ref 80–100)
MONOS PCT: 7.3 %
Monocyte #: 0.5 x10 3/mm (ref 0.2–0.9)
NEUTROS ABS: 5.6 x10 3/mm (ref 1.4–6.5)
Neutrophil %: 74.7 %
Platelet: 256 x10 3/mm (ref 150–440)
RBC: 2.84 10*6/uL — ABNORMAL LOW (ref 3.80–5.20)
RDW: 18.4 % — ABNORMAL HIGH (ref 11.5–14.5)
WBC: 7.5 x10 3/mm (ref 3.6–11.0)

## 2014-02-16 LAB — COMPREHENSIVE METABOLIC PANEL
Albumin: 3.2 g/dL — ABNORMAL LOW (ref 3.4–5.0)
Alkaline Phosphatase: 126 U/L — ABNORMAL HIGH
Anion Gap: 7 (ref 7–16)
BUN: 11 mg/dL (ref 7–18)
Bilirubin,Total: 0.3 mg/dL (ref 0.2–1.0)
Calcium, Total: 9.1 mg/dL (ref 8.5–10.1)
Chloride: 100 mmol/L (ref 98–107)
Co2: 28 mmol/L (ref 21–32)
Creatinine: 0.68 mg/dL (ref 0.60–1.30)
EGFR (African American): >60
EGFR (Non-African Amer.): >60
Glucose: 156 mg/dL — ABNORMAL HIGH (ref 65–99)
OSMOLALITY: 273 (ref 275–301)
POTASSIUM: 3.8 mmol/L (ref 3.5–5.1)
SGOT(AST): 12 U/L — ABNORMAL LOW (ref 15–37)
SGPT (ALT): 22 U/L
Sodium: 135 mmol/L — ABNORMAL LOW (ref 136–145)
Total Protein: 6.2 g/dL — ABNORMAL LOW (ref 6.4–8.2)

## 2014-02-16 LAB — IRON AND TIBC
Iron Bind.Cap.(Total): 294 ug/dL (ref 250–450)
Iron Saturation: 18 %
Iron: 54 ug/dL (ref 50–170)
UNBOUND IRON-BIND. CAP.: 240 ug/dL

## 2014-02-16 LAB — MAGNESIUM: Magnesium: 1.5 mg/dL — ABNORMAL LOW

## 2014-02-16 LAB — FERRITIN: Ferritin (ARMC): 159 ng/mL (ref 8–388)

## 2014-02-23 LAB — CBC CANCER CENTER
BASOS ABS: 0 x10 3/mm (ref 0.0–0.1)
BASOS PCT: 0.6 %
Eosinophil #: 0.1 x10 3/mm (ref 0.0–0.7)
Eosinophil %: 1.5 %
HCT: 27 % — ABNORMAL LOW (ref 35.0–47.0)
HGB: 9.2 g/dL — ABNORMAL LOW (ref 12.0–16.0)
Lymphocyte #: 0.8 x10 3/mm — ABNORMAL LOW (ref 1.0–3.6)
Lymphocyte %: 17.4 %
MCH: 33.6 pg (ref 26.0–34.0)
MCHC: 34.1 g/dL (ref 32.0–36.0)
MCV: 98 fL (ref 80–100)
MONO ABS: 0.4 x10 3/mm (ref 0.2–0.9)
MONOS PCT: 7.4 %
NEUTROS ABS: 3.5 x10 3/mm (ref 1.4–6.5)
NEUTROS PCT: 73.1 %
Platelet: 254 x10 3/mm (ref 150–440)
RBC: 2.75 10*6/uL — ABNORMAL LOW (ref 3.80–5.20)
RDW: 16.5 % — ABNORMAL HIGH (ref 11.5–14.5)
WBC: 4.7 x10 3/mm (ref 3.6–11.0)

## 2014-03-02 LAB — MAGNESIUM: Magnesium: 1.5 mg/dL — ABNORMAL LOW

## 2014-03-02 LAB — CBC CANCER CENTER
Basophil #: 0 x10 3/mm (ref 0.0–0.1)
Basophil %: 0.5 %
EOS ABS: 0.1 x10 3/mm (ref 0.0–0.7)
EOS PCT: 1.8 %
HCT: 25.4 % — AB (ref 35.0–47.0)
HGB: 8.8 g/dL — ABNORMAL LOW (ref 12.0–16.0)
Lymphocyte #: 1.1 x10 3/mm (ref 1.0–3.6)
Lymphocyte %: 32.7 %
MCH: 34.4 pg — AB (ref 26.0–34.0)
MCHC: 34.7 g/dL (ref 32.0–36.0)
MCV: 99 fL (ref 80–100)
MONO ABS: 0.5 x10 3/mm (ref 0.2–0.9)
MONOS PCT: 15.3 %
Neutrophil #: 1.6 x10 3/mm (ref 1.4–6.5)
Neutrophil %: 49.7 %
Platelet: 193 x10 3/mm (ref 150–440)
RBC: 2.55 10*6/uL — AB (ref 3.80–5.20)
RDW: 15.8 % — ABNORMAL HIGH (ref 11.5–14.5)
WBC: 3.3 x10 3/mm — ABNORMAL LOW (ref 3.6–11.0)

## 2014-03-09 LAB — COMPREHENSIVE METABOLIC PANEL
ALBUMIN: 3 g/dL — AB (ref 3.4–5.0)
ANION GAP: 10 (ref 7–16)
Alkaline Phosphatase: 127 U/L — ABNORMAL HIGH
BUN: 10 mg/dL (ref 7–18)
Bilirubin,Total: 0.4 mg/dL (ref 0.2–1.0)
CALCIUM: 8.3 mg/dL — AB (ref 8.5–10.1)
CO2: 27 mmol/L (ref 21–32)
CREATININE: 0.66 mg/dL (ref 0.60–1.30)
Chloride: 99 mmol/L (ref 98–107)
EGFR (African American): >60
EGFR (Non-African Amer.): >60
Glucose: 132 mg/dL — ABNORMAL HIGH (ref 65–99)
Osmolality: 273 (ref 275–301)
Potassium: 4.2 mmol/L (ref 3.5–5.1)
SGOT(AST): 14 U/L — ABNORMAL LOW (ref 15–37)
SGPT (ALT): 25 U/L
SODIUM: 136 mmol/L (ref 136–145)
TOTAL PROTEIN: 6 g/dL — AB (ref 6.4–8.2)

## 2014-03-09 LAB — CBC CANCER CENTER
Basophil #: 0 x10 3/mm (ref 0.0–0.1)
Basophil %: 0.7 %
EOS ABS: 0 x10 3/mm (ref 0.0–0.7)
EOS PCT: 1.1 %
HCT: 25.4 % — AB (ref 35.0–47.0)
HGB: 8.7 g/dL — AB (ref 12.0–16.0)
LYMPHS ABS: 0.6 x10 3/mm — AB (ref 1.0–3.6)
Lymphocyte %: 30.9 %
MCH: 34.7 pg — ABNORMAL HIGH (ref 26.0–34.0)
MCHC: 34.4 g/dL (ref 32.0–36.0)
MCV: 101 fL — AB (ref 80–100)
Monocyte #: 0.3 x10 3/mm (ref 0.2–0.9)
Monocyte %: 12.8 %
Neutrophil #: 1.1 x10 3/mm — ABNORMAL LOW (ref 1.4–6.5)
Neutrophil %: 54.5 %
PLATELETS: 147 x10 3/mm — AB (ref 150–440)
RBC: 2.52 10*6/uL — ABNORMAL LOW (ref 3.80–5.20)
RDW: 16.3 % — ABNORMAL HIGH (ref 11.5–14.5)
WBC: 2.1 x10 3/mm — AB (ref 3.6–11.0)

## 2014-03-09 LAB — MAGNESIUM: MAGNESIUM: 1.5 mg/dL — AB

## 2014-03-11 ENCOUNTER — Ambulatory Visit: Payer: Self-pay | Admitting: Oncology

## 2014-03-14 LAB — CBC CANCER CENTER
Basophil #: 0 x10 3/mm (ref 0.0–0.1)
Basophil %: 0.6 %
EOS PCT: 0.9 %
Eosinophil #: 0 x10 3/mm (ref 0.0–0.7)
HCT: 25.9 % — ABNORMAL LOW (ref 35.0–47.0)
HGB: 9.2 g/dL — ABNORMAL LOW (ref 12.0–16.0)
LYMPHS ABS: 0.8 x10 3/mm — AB (ref 1.0–3.6)
LYMPHS PCT: 31.7 %
MCH: 34.6 pg — ABNORMAL HIGH (ref 26.0–34.0)
MCHC: 35.5 g/dL (ref 32.0–36.0)
MCV: 98 fL (ref 80–100)
MONOS PCT: 7.2 %
Monocyte #: 0.2 x10 3/mm (ref 0.2–0.9)
NEUTROS ABS: 1.5 x10 3/mm (ref 1.4–6.5)
Neutrophil %: 59.6 %
PLATELETS: 190 x10 3/mm (ref 150–440)
RBC: 2.66 10*6/uL — AB (ref 3.80–5.20)
RDW: 15.9 % — ABNORMAL HIGH (ref 11.5–14.5)
WBC: 2.5 x10 3/mm — AB (ref 3.6–11.0)

## 2014-03-14 LAB — IRON AND TIBC
Iron Bind.Cap.(Total): 315 ug/dL (ref 250–450)
Iron Saturation: 56 %
Iron: 177 ug/dL — ABNORMAL HIGH (ref 50–170)
Unbound Iron-Bind.Cap.: 138 ug/dL

## 2014-03-14 LAB — POTASSIUM: POTASSIUM: 4.1 mmol/L (ref 3.5–5.1)

## 2014-03-14 LAB — MAGNESIUM: Magnesium: 1.3 mg/dL — ABNORMAL LOW

## 2014-03-16 LAB — MAGNESIUM: Magnesium: 1.9 mg/dL

## 2014-03-23 LAB — CBC CANCER CENTER
BASOS ABS: 0 x10 3/mm (ref 0.0–0.1)
Basophil %: 0.5 %
EOS ABS: 0 x10 3/mm (ref 0.0–0.7)
Eosinophil %: 1.3 %
HCT: 24 % — AB (ref 35.0–47.0)
HGB: 8.4 g/dL — ABNORMAL LOW (ref 12.0–16.0)
LYMPHS ABS: 1.4 x10 3/mm (ref 1.0–3.6)
Lymphocyte %: 48.9 %
MCH: 35.5 pg — AB (ref 26.0–34.0)
MCHC: 35.1 g/dL (ref 32.0–36.0)
MCV: 101 fL — AB (ref 80–100)
MONO ABS: 0.5 x10 3/mm (ref 0.2–0.9)
MONOS PCT: 16.7 %
NEUTROS ABS: 0.9 x10 3/mm — AB (ref 1.4–6.5)
Neutrophil %: 32.6 %
Platelet: 166 x10 3/mm (ref 150–440)
RBC: 2.37 10*6/uL — ABNORMAL LOW (ref 3.80–5.20)
RDW: 16.8 % — AB (ref 11.5–14.5)
WBC: 2.8 x10 3/mm — ABNORMAL LOW (ref 3.6–11.0)

## 2014-03-23 LAB — MAGNESIUM: Magnesium: 1.3 mg/dL — ABNORMAL LOW

## 2014-03-30 LAB — COMPREHENSIVE METABOLIC PANEL
ANION GAP: 9 (ref 7–16)
AST: 11 U/L — AB (ref 15–37)
Albumin: 3 g/dL — ABNORMAL LOW (ref 3.4–5.0)
Alkaline Phosphatase: 120 U/L — ABNORMAL HIGH
BUN: 13 mg/dL (ref 7–18)
Bilirubin,Total: 0.3 mg/dL (ref 0.2–1.0)
CHLORIDE: 102 mmol/L (ref 98–107)
CREATININE: 0.68 mg/dL (ref 0.60–1.30)
Calcium, Total: 8.4 mg/dL — ABNORMAL LOW (ref 8.5–10.1)
Co2: 26 mmol/L (ref 21–32)
EGFR (Non-African Amer.): >60
Glucose: 133 mg/dL — ABNORMAL HIGH (ref 65–99)
OSMOLALITY: 276 (ref 275–301)
POTASSIUM: 3.7 mmol/L (ref 3.5–5.1)
SGPT (ALT): 22 U/L
Sodium: 137 mmol/L (ref 136–145)
TOTAL PROTEIN: 6 g/dL — AB (ref 6.4–8.2)

## 2014-03-30 LAB — CBC CANCER CENTER
BASOS ABS: 0 x10 3/mm (ref 0.0–0.1)
Basophil %: 0.5 %
EOS ABS: 0 x10 3/mm (ref 0.0–0.7)
Eosinophil %: 0.6 %
HCT: 26.8 % — ABNORMAL LOW (ref 35.0–47.0)
HGB: 9.2 g/dL — ABNORMAL LOW (ref 12.0–16.0)
LYMPHS ABS: 2 x10 3/mm (ref 1.0–3.6)
Lymphocyte %: 29.6 %
MCH: 35.3 pg — AB (ref 26.0–34.0)
MCHC: 34.3 g/dL (ref 32.0–36.0)
MCV: 103 fL — AB (ref 80–100)
MONO ABS: 0.6 x10 3/mm (ref 0.2–0.9)
MONOS PCT: 9.6 %
Neutrophil #: 4 x10 3/mm (ref 1.4–6.5)
Neutrophil %: 59.7 %
PLATELETS: 192 x10 3/mm (ref 150–440)
RBC: 2.6 10*6/uL — ABNORMAL LOW (ref 3.80–5.20)
RDW: 17.8 % — AB (ref 11.5–14.5)
WBC: 6.6 x10 3/mm (ref 3.6–11.0)

## 2014-03-30 LAB — MAGNESIUM: MAGNESIUM: 1.4 mg/dL — AB

## 2014-04-06 LAB — COMPREHENSIVE METABOLIC PANEL
ANION GAP: 7 (ref 7–16)
AST: 18 U/L (ref 15–37)
Albumin: 3.3 g/dL — ABNORMAL LOW (ref 3.4–5.0)
Alkaline Phosphatase: 118 U/L — ABNORMAL HIGH (ref 46–116)
BUN: 8 mg/dL (ref 7–18)
Bilirubin,Total: 0.4 mg/dL (ref 0.2–1.0)
CREATININE: 0.62 mg/dL (ref 0.60–1.30)
Calcium, Total: 8.5 mg/dL (ref 8.5–10.1)
Chloride: 101 mmol/L (ref 98–107)
Co2: 30 mmol/L (ref 21–32)
GLUCOSE: 105 mg/dL — AB (ref 65–99)
OSMOLALITY: 274 (ref 275–301)
Potassium: 4.1 mmol/L (ref 3.5–5.1)
SGPT (ALT): 32 U/L (ref 14–63)
Sodium: 138 mmol/L (ref 136–145)
Total Protein: 6.7 g/dL (ref 6.4–8.2)

## 2014-04-06 LAB — CBC CANCER CENTER
BASOS PCT: 0.5 %
Basophil #: 0 x10 3/mm (ref 0.0–0.1)
EOS PCT: 1.5 %
Eosinophil #: 0.1 x10 3/mm (ref 0.0–0.7)
HCT: 29.5 % — AB (ref 35.0–47.0)
HGB: 10.1 g/dL — ABNORMAL LOW (ref 12.0–16.0)
Lymphocyte #: 1.8 x10 3/mm (ref 1.0–3.6)
Lymphocyte %: 23.6 %
MCH: 35.4 pg — AB (ref 26.0–34.0)
MCHC: 34.3 g/dL (ref 32.0–36.0)
MCV: 103 fL — AB (ref 80–100)
MONO ABS: 0.7 x10 3/mm (ref 0.2–0.9)
MONOS PCT: 9.3 %
NEUTROS ABS: 5.1 x10 3/mm (ref 1.4–6.5)
Neutrophil %: 65.1 %
Platelet: 330 x10 3/mm (ref 150–440)
RBC: 2.86 10*6/uL — ABNORMAL LOW (ref 3.80–5.20)
RDW: 17.1 % — ABNORMAL HIGH (ref 11.5–14.5)
WBC: 7.8 x10 3/mm (ref 3.6–11.0)

## 2014-04-06 LAB — MAGNESIUM: MAGNESIUM: 1.7 mg/dL — AB

## 2014-04-11 ENCOUNTER — Ambulatory Visit: Payer: Self-pay | Admitting: Oncology

## 2014-04-11 ENCOUNTER — Encounter: Payer: Self-pay | Admitting: General Surgery

## 2014-04-11 ENCOUNTER — Ambulatory Visit (INDEPENDENT_AMBULATORY_CARE_PROVIDER_SITE_OTHER): Payer: BC Managed Care – PPO | Admitting: General Surgery

## 2014-04-11 VITALS — BP 120/62 | HR 89 | Resp 15 | Ht 62.0 in | Wt 251.0 lb

## 2014-04-11 DIAGNOSIS — C50911 Malignant neoplasm of unspecified site of right female breast: Secondary | ICD-10-CM

## 2014-04-11 DIAGNOSIS — Z923 Personal history of irradiation: Secondary | ICD-10-CM

## 2014-04-11 HISTORY — DX: Personal history of irradiation: Z92.3

## 2014-04-11 NOTE — Progress Notes (Signed)
Patient ID: Felicia Kidd, female   DOB: [birth year], 54 y.o.   MRN: [id number]  Chief Complaint  Patient presents with  . Follow-up    breast cancer    HPI Felicia Kidd is a 54 y.o. female here today following up for her right breast cancer. Patient states she is doing well. She reports no breast problems. She finished her chemotherapy on 03/30/14. HPI  Past Medical History  Diagnosis Date  . Diabetes mellitus without complication   . Hyperlipidemia   . Hypertension   . Thyroid disease   . 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      Past Surgical History  Procedure Laterality Date  . Dilation and curettage of uterus      Dr Vernie Ammons  . Portacath placement  11-02-13  . Breast surgery Right 09-27-13    Wide excision of Right breast lesion with attempted sentinel node biopsy followed by axillary dissection    No family history on file.  Social History History  Substance Use Topics  . Smoking status: Never Smoker   . Smokeless tobacco: Never Used  . Alcohol Use: No    Allergies  Allergen Reactions  . Other Rash and Swelling    PNEUMOVAX.    Current Outpatient Prescriptions  Medication Sig Dispense Refill  . acetaminophen (TYLENOL) 325 MG tablet Take 650 mg by mouth every 6 (six) hours as needed.    Marland Kitchen atorvastatin (LIPITOR) 40 MG tablet Take 40 mg by mouth daily.    . Ferrous Sulfate (IRON) 325 (65 FE) MG TABS Take 1 tablet by mouth daily.    . fluticasone (FLONASE) 50 MCG/ACT nasal spray Place 2 sprays into both nostrils daily.    . Halcinonide (HALOG) 0.1 % CREA Apply 1 Tube topically 2 (two) times daily.    . metFORMIN (GLUCOPHAGE) 500 MG tablet Take 1 tablet by mouth daily at 6 (six) AM.    . omeprazole (PRILOSEC) 20 MG capsule Take 20 mg by mouth daily.    . ondansetron (ZOFRAN) 4 MG tablet Take 4 mg by mouth every  8 (eight) hours as needed.     . potassium chloride (MICRO-K) 10 MEQ CR capsule Take 10 mEq by mouth daily.    Marland Kitchen SLOW-MAG 71.5-119 MG TBEC SR tablet Take 1 tablet by mouth daily.  0  . telmisartan-hydrochlorothiazide (MICARDIS HCT) 80-25 MG per tablet Take 1 tablet by mouth daily.      No current facility-administered medications for this visit.    Review of Systems Review of Systems  Constitutional: Negative.   Respiratory: Negative.   Cardiovascular: Negative.     Blood pressure 120/62, pulse 89, resp. rate 15, height 5\' 2"  (1.575 m), weight 251 lb (113.853 kg).  Physical Exam Physical Exam  Constitutional: She is oriented to person, place, and time. She appears well-developed and well-nourished.  Cardiovascular: Normal rate, regular rhythm and normal heart sounds.   Pulmonary/Chest: Effort normal and breath sounds normal. Right breast exhibits no inverted nipple, no mass, no nipple discharge, no skin change and no tenderness. Left breast exhibits no inverted nipple, no mass, no nipple discharge, no skin change and no tenderness.    Right breast well healed incision at 6 o'clk Right breast thickening at 6 o'clk position  Neurological: She is alert and oriented to person, place, and time.    Data Reviewed  Medical oncology and radiation oncology notes of 04/06/2013. Patient has completed adjuvant chemotherapy and is scheduled to start whole breast radiation and axillary field coverage in February 2016.  Assessment    Doing well status post wide excision and axillary dissection.    Plan    The patient can discuss with medical oncology of the port can be removed. We'll plan for bilateral mammograms in summer 2016. She may be on a protocol schedule for her mammograms and this will be clarified with the cancer Center.     PCP:  Despina Arias 04/12/2014, 12:48 PM

## 2014-04-11 NOTE — Patient Instructions (Signed)
Continue self breast exams. Call office for any new breast issues or concerns. 

## 2014-04-12 ENCOUNTER — Encounter: Payer: Self-pay | Admitting: General Surgery

## 2014-05-10 ENCOUNTER — Ambulatory Visit
Admit: 2014-05-10 | Disposition: A | Discharge status: Home / Self Care | Payer: Self-pay | Attending: Oncology | Admitting: Oncology

## 2014-06-03 LAB — CBC CANCER CENTER
BASOS PCT: 0.5 %
Basophil #: 0 x10 3/mm (ref 0.0–0.1)
EOS ABS: 0.2 x10 3/mm (ref 0.0–0.7)
EOS PCT: 3 %
HCT: 35 % (ref 35.0–47.0)
HGB: 12 g/dL (ref 12.0–16.0)
LYMPHS ABS: 1.2 x10 3/mm (ref 1.0–3.6)
Lymphocyte %: 20.8 %
MCH: 32.8 pg (ref 26.0–34.0)
MCHC: 34.4 g/dL (ref 32.0–36.0)
MCV: 95 fL (ref 80–100)
Monocyte #: 0.5 x10 3/mm (ref 0.2–0.9)
Monocyte %: 9.3 %
Neutrophil #: 3.9 x10 3/mm (ref 1.4–6.5)
Neutrophil %: 66.4 %
Platelet: 270 x10 3/mm (ref 150–440)
RBC: 3.67 10*6/uL — AB (ref 3.80–5.20)
RDW: 12.8 % (ref 11.5–14.5)
WBC: 5.9 x10 3/mm (ref 3.6–11.0)

## 2014-06-08 LAB — CBC CANCER CENTER
Basophil #: 0 x10 3/mm (ref 0.0–0.1)
Basophil %: 0.3 %
EOS ABS: 0.1 x10 3/mm (ref 0.0–0.7)
Eosinophil %: 2 %
HCT: 34.4 % — ABNORMAL LOW (ref 35.0–47.0)
HGB: 12 g/dL (ref 12.0–16.0)
Lymphocyte #: 1.2 x10 3/mm (ref 1.0–3.6)
Lymphocyte %: 19.7 %
MCH: 32.9 pg (ref 26.0–34.0)
MCHC: 34.9 g/dL (ref 32.0–36.0)
MCV: 94 fL (ref 80–100)
MONO ABS: 0.6 x10 3/mm (ref 0.2–0.9)
Monocyte %: 9 %
Neutrophil #: 4.3 x10 3/mm (ref 1.4–6.5)
Neutrophil %: 69 %
Platelet: 256 x10 3/mm (ref 150–440)
RBC: 3.66 10*6/uL — ABNORMAL LOW (ref 3.80–5.20)
RDW: 12.6 % (ref 11.5–14.5)
WBC: 6.3 x10 3/mm (ref 3.6–11.0)

## 2014-06-10 ENCOUNTER — Ambulatory Visit
Admit: 2014-06-10 | Disposition: A | Discharge status: Home / Self Care | Payer: Self-pay | Attending: Oncology | Admitting: Oncology

## 2014-07-02 NOTE — Op Note (Signed)
PATIENT NAME:  Felicia Kidd, FAHRINGER MR#:  [id number] DATE OF BIRTH:  March 01, 1961  DATE OF PROCEDURE:  11/02/2013   PREOPERATIVE DIAGNOSES: Right breast cancer. Need for central venous access.   POSTOPERATIVE DIAGNOSES:  Right breast cancer. Need for central venous access  OPERATIVE PROCEDURE: Left subclavian PowerPort placement.   OPERATING SURGEON: Robert Bellow, MD  ANESTHESIA: Attended local, 15 mL of 1% plain Xylocaine.   CLINICAL NOTE: This 54 year old woman recently underwent wide local excision and axillary dissection for stage II breast cancer. She is a candidate for adjuvant chemotherapy. She has poor venous access, and central venous access was requested by her treating oncologist. The patient did have evidence of infection at the JP drain site in the axilla, and vancomycin and Cipro are administered prior to the procedure for antibiotic prophylaxis.   OPERATIVE NOTE: With the patient under adequate sedation, the chest and neck were prepped with ChloraPrep and draped. Ultrasound was used to confirm patency of the subclavian vein. This was then cannulated under ultrasound guidance. The guidewire was passed, followed by the catheter into the left subclavian vein, into the junction of the SVC and right atrium. Fluoroscopic confirm location. The port was tunneled to a pocket on the left anterior chest. It was anchored into 2-point fixation with 3-0 Prolene suture. The wound was closed in layers with 3-0 Vicryl to the adipose layer and a running 4-0 Vicryl subcuticular suture for the skin. Benzoin and Steri-Strips followed by Telfa and Tegaderm dressing was applied.   Erect portable chest x-ray in the recovery room showed the catheter tip as described above, and no evidence of pneumothorax.    ____________________________ Robert Bellow, MD jwb:MT D: 11/02/2013 20:19:24 ET T: 11/03/2013 07:31:18 ET JOB#: [id number]  cc: Robert Bellow, MD, <Dictator> Janak K. Oliva Bustard, MD Jasiah Elsen  Amedeo Kinsman MD ELECTRONICALLY SIGNED 11/03/2013 20:15

## 2014-07-02 NOTE — Op Note (Signed)
PATIENT NAME:  Felicia Kidd, Felicia Kidd MR#:  [id number] DATE OF BIRTH:  10-07-1960  DATE OF PROCEDURE:  09/27/2013  PREOPERATIVE DIAGNOSIS: Right breast cancer.   POSTOPERATIVE DIAGNOSIS: Right breast cancer.   OPERATIVE PROCEDURE: Right breast wide excision, mastoplasty, attempted sentinel node biopsy, axillary dissection.   SURGEON: Robert Bellow, MD  ANESTHESIA: General by LMA under Dr. [place] Service, Marcaine 0.5% with 1:200,000 units of epinephrine 30 mL local infiltration.  CLINICAL NOTE: This 54 year old woman had an abnormal mammogram, and core biopsy showed evidence of invasive mammary carcinoma. Preoperative ultrasound suggested an enlarged lymph node but FNA sampling was inconclusive. She underwent technetium sulfur colloid injection prior to the procedure and was brought to the operating room today for a planned local excision and sentinel node biopsy.   OPERATIVE NOTE: With the patient under adequate general anesthesia, and having received Kefzol intravenously, the breast was prepped with ChloraPrep and draped. This included the axilla and neck. The gamma finder showed scant uptake in the lower level of the axilla with counts of 15 to 20. Prior to prepping, 5 mL of methylene blue diluted 1:2 with normal saline was injected in the subareolar plexus. A transverse incision was made in this area after local anesthesia was instilled, and extension down through the generous layer of adipose tissue was undertaken. The axillary envelope was exposed, as was the lateral border of the pectoralis muscle. Careful palpation and scanning with the gamma finder failed to show evidence of an increased area of uptake, and this portion of the procedure was abandoned. Attention was then turned to the breast lesion at the 6 o'clock position. The upper abdominal wall had been taped to the contralateral side of the table to help provide better exposure. Ultrasound was used to confirm the location of the mass, and  the borders were marked. Local anesthesia was infiltrated, and a radial incision made at the 6 o'clock position and carried down from the edge of the areola to the inframammary fold. Hemostasis was with electrocautery. A 5 x 5 x 3 cm block of tissue was excised, orientated, and specimen radiograph confirmed a central density (a clip had not been placed). The report from pathology showed grossly clear margins of 10 to 12 mm. While the pathology report was pending, attention was turned towards the axilla. In light of the finding of previously identified enlarged lymph node and an inability to identify the sentinel node, it was elected to make an incision about 3 fingerbreadths higher in the axilla, and this was carried down through the skin and subcutaneous tissue with hemostasis achieved by electrocautery. This was a fairly generous incision to provide adequate exposure. The skin again was incised sharply and the remaining dissection completed with electrocautery and the Harmonic scalpel. The axillary envelope was opened and the axillary vein identified. The long thoracic nerve of Bell and thoracodorsal nerve, artery, and vein bundle were identified and protected. A level 1 and 2 dissection was undertaken. A firm, hard node adjacent to the chest wall near the superior aspect of level 2 was identified. The axillary contents were sent in formalin for routine histology. Good hemostasis was noted. A Blake drain was brought out through a separate stab wound incision and anchored in place with 2-0 nylon. Both axillary incisions were then closed with 2-0 Vicryl sutures: The smaller sentinel node exploration site with 2-0 Vicryl figure-of-eight suture to the deep layer and then a running 4-0 Vicryl subcuticular suture for the skin. The axillary envelope was closed with  a running 2-0 Vicryl for the axillary dissection site followed by a second layer of 2-0 Vicryl and a 4-0 Vicryl subcuticular suture for the skin.  Attention  was turned towards the breast closure. The breast was elevated off the underlying pectoralis muscle, taking the fascia of that muscle with the breast parenchyma. This was mobilized from the 3 to 9 o'clock position. This allowed approximation of the fascia with interrupted 2-0 Vicryl figure-of-eight sutures. The adipose and breast tissue was then approximated with interrupted 2-0 Vicryl figure-of-eight sutures. The skin was mobilized 3 cm circumferentially around the wound and this was then approximated with 2-0 Vicryl and 3-0 Vicryl sutures followed by a 4-0 Vicryl subcuticular suture. Benzoin, Steri-Strips, Telfa, and Tegaderm dressings were applied. The Blake drain was placed to self-suction. A compressive wrap with fluff gauze, Kerlix, and an Ace wrap was then applied.   The patient tolerated the procedure well and was taken to the recovery room in stable condition.   ____________________________ Robert Bellow, MD jwb:sk D: 09/27/2013 15:12:15 ET T: 09/27/2013 23:59:23 ET JOB#: [id number]  cc: Robert Bellow, MD, <Dictator> Glendon Axe, MD Heith Haigler Amedeo Kinsman MD ELECTRONICALLY SIGNED 09/28/2013 14:07

## 2014-07-10 NOTE — Consult Note (Signed)
Reason for Visit: This 54 year old Female patient presents to the clinic for initial evaluation of  breast cancer .   Referred by Dr. Oliva Bustard.  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 Patient is now for right breast and peripheral lymphatic radiation  Pathology Report pathology report reviewed   Imaging Report mammograms and PET CT scan reviewed   Referral Report clinical notes reviewed   Planned Treatment Regimen right breast and peripheral lymphatic radiation   HPI   patient is a pleasant 54 year old female who presented to her PMD with an abnormal mammogramshowing a suspicious lesion in the 6 left was position of the right breast with prominent right axillary lymphadenopathy. Tumor measures approximate 1.4 cm in greatest dimension. Ultrasound confirmed lesion as well as suspicious axillary lymph nodes. She went on to have a biopsy which was positive for invasive mammary carcinoma.she then underwent a wide local excision of the right breast and axillar node dissection. Tumor was 1.4 cm overall grade 2 invasive mammary carcinoma. 6 of 14 axillary lymph nodes were positive for metastatic disease with 5 showing extracapsular extension. Tumor was triple negative margins were clear. She had positive lymphovascular invasion. Her BR CAt testing was negative. She went on protocol  n.NR G?BR 038for adjuvant chemotherapy received Cytoxan and Adriamycin in a dose dense fashion followed by carbotaxol. She did develop hypo-magnesium as well as some peripheral neuropathy. She otherwise tolerated treatment extremely well. Initial PET CT scan showed no evidence of distant disease except for postoperative changes in the right breast and axilla. She is seen today for consideration of adjuvant  chemotherapy she has completed all of her chemotherapy. She is somewhat fatigued although specifically denies breast tenderness cough or bone pain.  Past Hx:    Oncology Protocol: Pt participating in [id number] breast cancer research study - receiving Adriamycin + Cytoxan x 4 cycles followed by Carboplatin every [redacted] weeks along with weekly Taxol x 12weeks. Research contact is [employer]. Call [phone] for questions or problems   Breast Cancer:    GERD - Esophageal Reflux:    hypercholesterolemia:    htn:    diabetes:    seasonal allergies:    Wide Excision Right Breast with SN excision: Jul 2015   Right breast biopsy:    Dilation and Curretage:   Past, Family and Social History:  Past Medical History positive   Cardiovascular hyperlipidemia; hypertension   Respiratory seasonal rhinitis   Gastrointestinal GERD   Endocrine diabetes mellitus   Past Surgical History D&C   Family History noncontributory   Social History noncontributory   Additional Past Medical and Surgical History ccompanied by her mother today   Allergies:   No Known Allergies:   Home Meds:  Home Medications: Medication Instructions Status  Tobradex 0.1%-0.3% ophthalmic suspension 1-2 drop(s) to each eye 2 times a day x 7 days Active  potassium chloride 10 mEq oral capsule, extended release 1 cap(s) orally once a day Active  duke's magic mouthwash 10 milliliter(s) swish and swallow 3 times a day Active  Tobradex 0.1%-0.3% ophthalmic ointment 0.5 inch(es) to each affected eye once a day Active  ibuprofen 200 mg oral tablet 2 tab(s) orally 3 times a day, As Needed - for Pain Active  acetaminophen-HYDROcodone 325 mg-5 mg oral tablet 1 tab(s) orally every 4 hours,  As needed, moderate pain (4-6/10) Active  ondansetron 4 mg oral tablet 1 tab(s) orally every 6 hours as needed for chemotherapy nausea and vomiting  Active  Micardis HCT 80 mg-25 mg oral tablet 1  orally once a day  Active  Anusol-HC 1  application rectal 3 times a day, As Needed for hemorrhoids Active  omeprazole 20 mg delayed release capsule 1 cap(s) orally once a day  Active  multivitamin 1 tab(s) orally once a day  Active  atorvastatin 40 mg oral tablet 1 tab(s) orally once a day (at bedtime) Active  metFORMIN 500 mg oral tablet 1 tab(s) orally 2 times a day Active  Claritin 10 mg oral tablet 1 tab(s) orally once a day Active  fluticasone nasal 50 mcg/inh spray 2 spray(s) nasal once a day Active  fluocinonide topical 0.05% topical cream Apply topically to affected area prn Active  nystatin-triamcinolone topical 100000 units/g-0.1% topical cream Apply topically to affected area prn Active  Halog 0.1% topical cream Apply topically to affected area prn Active  triamcinolone topical 0.025% topical cream Apply topically to affected area prn Active  Fish Oil - oral capsule 1 cap(s) orally once a day Active  Tylenol 325 mg oral tablet  orally as needed for pain Active   Review of Systems:  General negative   Performance Status (ECOG) 0   Skin negative   Breast see HPI   Ophthalmologic negative   ENMT negative   Respiratory and Thorax negative   Cardiovascular negative   Gastrointestinal negative   Genitourinary negative   Musculoskeletal negative   Neurological negative   Psychiatric negative   Hematology/Lymphatics negative   Endocrine negative   Allergic/Immunologic negative   Review of Systems   except for slight peripheral neuropathydenies any weight loss, fatigue, weakness, fever, chills or night sweats. Patient denies any loss of vision, blurred vision. Patient denies any ringing  of the ears or hearing loss. No irregular heartbeat. Patient denies heart murmur or history of fainting. Patient denies any chest pain or pain radiating to her upper extremities. Patient denies any shortness of breath, difficulty breathing at night, cough or hemoptysis. Patient denies any swelling in the lower legs.  Patient denies any nausea vomiting, vomiting of blood, or coffee ground material in the vomitus. Patient denies any stomach pain. Patient states has had normal bowel movements no significant constipation or diarrhea. Patient denies any dysuria, hematuria or significant nocturia. Patient denies any problems walking, swelling in the joints or loss of balance. Patient denies any skin changes, loss of hair or loss of weight. Patient denies any excessive worrying or anxiety or significant depression. Patient denies any problems with insomnia. Patient denies excessive thirst, polyuria, polydipsia. Patient denies any swollen glands, patient denies easy bruising or easy bleeding. Patient denies any recent infections, allergies or URI. Patient "s visual fields have not changed significantly in recent time.   Nursing Notes:  Nursing Vital Signs and Chemo Nursing Nursing Notes: *CC Vital Signs Flowsheet:   27-Jan-16 14:11  Temp Temperature 97.9  Pulse Pulse 100  Respirations Respirations 18  SBP SBP 141  DBP DBP 69  Current Weight (kg) (kg) 112.1  Height (cm) centimeters 157  BSA (m2) 2    14:53  Height (cm) centimeters 157   Physical Exam:  General/Skin/HEENT:  General normal   Skin normal   Eyes normal   ENMT normal   Head and Neck normal   Additional PE well-developed obese female in NAD. Lungs are clear to A&P cardiac  examination shows regular rate and rhythm. Right breast is wide local excision which is well healed. No dominant mass or nodularity is noted in either breast in 2 positions examined. No axillary or supraclavicular adenopathy is appreciated. She does have a Port-A-Cath placed in her left breast fairly low down in the breast parenchyma.   Breasts/Resp/CV/GI/GU:  Respiratory and Thorax normal   Cardiovascular normal   Gastrointestinal normal   Genitourinary normal   MS/Neuro/Psych/Lymph:  Musculoskeletal normal   Neurological normal   Lymphatics normal   Other  Results:  Radiology Results: LabUnknown:    26-Jun-15 13:14, Digital Additional Views Rt Breast (SCR)  PACS Image     05-Aug-15 11:34, PET/CT Scan Breast CA Stage/Restaging  PACS [place]:    26-Jun-15 13:14, Digital Additional Views Rt Breast (SCR)  Digital Additional Views Rt Breast (SCR)   REASON FOR EXAM:    RT BREAST AV FOR NODULE OUTSIDE  COMMENTS:       PROCEDURE: MAM - MAM DIG ADDVIEWS RT SCR  - Sep 03 2013  1:14PM     ADDENDUM REPORT: 09/03/2013 14:56    ADDENDUM:  CLINICAL DATA: Screening callback for questioned right breast mass    EXAM: DIGITAL DIAGNOSTIC right MAMMOGRAM ULTRASOUND right BREAST    COMPARISON: Prior comparison mammograms at this facility and most  recent 2015 [facility] ACR Breast Density: ACR Breast Density  Category b: There are scattered areas of fibroglandular density.  FINDINGS: There is a persistent irregular approximately 2 cm mass  right breast 6 o'clock location, corresponding to the screening  mammographic finding.    On physical exam, I palpate a mass in the right breast 6 o'clock  location.    Ultrasound is performed, showing an irregular hypoechoic shadowing  centrally necrotic right breast mass 6 o'clock location 4 cm from  the nipple measuring 1.4 x 1.2 x 1.1 cm. This corresponds to the  mammographic finding. There is an abnormal right axillary lymph node  measuring 0.9 cm in maximal short axis dimension with abnormal  cortical thickening.     IMPRESSION:  Suspicious right breast mass 6 o'clock location with ipsilateral  right axillary lymphadenopathy. Ultrasound-guided core biopsy is  recommended for both areas. The patient's referring physician will  be contacted regarding scheduling this procedure.    RECOMMENDATION:    Right ultrasound-guided breast and axilla core needle biopsy    I have discussed the findings and recommendations with the patient.  Results were also provided in writing at the conclusion  of the  visit. If applicable, a reminder letter will be sent to the patient  regarding the next appointment.    BI-RADS CATEGORY:  4: Suspicious.      Electronically Signed    By: Conchita Paris M.D.    On: 09/03/2013 14:56    CLINICAL DATA:  Screening callback for questioned right breast mass    EXAM:  DIGITAL DIAGNOSTIC  right MAMMOGRAM    ULTRASOUND right BREAST    COMPARISON:  Prior comparison mammograms at this facility and most  recent 2015 [facility]    ACR Breast Density Category b: There are scattered areas of  fibroglandular density.    FINDINGS:  There is a persistent irregular approximately 2 cm mass right breast  6 o'clock location, corresponding to the screening mammographic  finding.    On physical exam, I palpate a mass in the right breast 6 o'clock  location.    Ultrasound is performed, showing an irregular hypoechoic  shadowing  centrally necrotic right breast mass 6 o'clock location 4 cm from  the nipple measuring 1.4 x 1.2 x 1.1 cm. This corresponds to the  mammographic finding. There is an abnormal right axillary lymph node  measuring 0.9 cm in maximal short axis dimension with abnormal  cortical thickening.    IMPRESSION:  Suspicious right breast mass 6 o'clock location with ipsilateral  right axillary lymphadenopathy. Ultrasound-guided core biopsy is  recommended for both areas. The patient's referring physician will  be contacted regarding scheduling this procedure.    RECOMMENDATION:  Right ultrasound-guided breast and axilla core needle biopsy    I have discussed the findings and recommendations with the patient.  Results were also provided in writing at the conclusion of the  visit. If applicable, a reminder letter will be sent to the patient  regarding the next appointment.    BI-RADS CATEGORY  4: Suspicious.    Electronically Signed:  By: Conchita Paris M.D.  On: 09/03/2013 13:24         Verified By: Arline Asp,  M.D.,  Nuclear Med:    05-Aug-15 11:34, PET/CT Scan Breast CA Stage/Restaging  PET/CT Scan Breast CA Stage/Restaging   REASON FOR EXAM:    breast cancer staging  COMMENTS:       PROCEDURE: PET - PET/CT RESTG BREAST CA  - Oct 13 2013 11:34AM     CLINICAL DATA:  Initial treatment strategy for breast cancer.    EXAM:  NUCLEAR MEDICINE PET SKULL BASE TO THIGH    TECHNIQUE:  12.0 mCi F-18 FDG was injected intravenously. Full-ring PET imaging  was performed from the skull base to thigh after the radiotracer. CT  data was obtained and used for attenuation correction and anatomic  localization.  FASTING BLOOD GLUCOSE:  Value: 110 mg/dl    COMPARISON:  None.    FINDINGS:  NECK    There are no hypermetabolic lymph nodes or masses within the right  breast.    CHEST    Extensive postoperative changes involving the right breast and right  axilla identified. There is increased uptake within the right breast  and right axilla which likely reflects postoperative change. There  is associated skin thickening and subcutaneous fat stranding  involving the right breast. In the early postoperative the uptake  within the right breast and right axilla is nonspecific. No abnormal  uptake within the left breast or left axilla. No hypermetabolic  pulmonary nodules. There is no hypermetabolic mediastinal or hilar  lymph node metastasis.    ABDOMEN/PELVIS    No abnormal hypermetabolic activity within the liver, pancreas,  adrenal glands, or spleen. No hypermetabolic lymph nodes in the  abdomen or pelvis. Gallstone is noted within the neck of the  gallbladder measuring 1.8 cm. Bilateral ovarian cysts are noted.  Right ovarian cyst measures 4.9 cm. The left ovarian cyst measures  3.6 cm.  SKELETON    No focal hypermetabolic activity to suggest skeletal metastasis.     IMPRESSION:  1. Extensive postop changes identified within the right breast and  right axilla.  2. No evidence for lung or  mediastinal metastasis or distant  metastatic disease.      Electronically Signed    By: Kerby Moors M.D.    On: 10/13/2013 13:06     Verified By: Angelita Ingles, M.D.,   Relevent Results:   Relevant Scans and Labs mammograms and PET CT scan reviewed   Assessment and Plan: Impression:   stage IIIa  invasive mammary carcinoma triple negative in 54 year old female status post wide local excision axillar node dissection and adjuvant chemotherapy under protocol. Now for right breast and peripheral lymphatic radiation Plan:   at this time to go ahead with adjuvant radiation therapy to her right breast and peripheral lymphatics. Would plan on delivering 5000 sensory to both sites boosting her scar another 1400 cGy using electron beam. Risks and benefits of treatment including skin reaction, fatigue, inclusion of some superficial lung, alteration of blood counts, and risk of increasing chances of lymphedema of her right upper extremity were all explained in detail to the patient and her mother. They both seem to copperhead my treatment plan well. I will allow about another week or so for her to recover from chemotherapy and have scheduled and ordered CT simulation at that time. Case was discussed with medical oncology.  I would like to take this opportunity for allowing me to participate in the care of your patient..  Fax to Physician:  Physicians To Recieve Fax: Robert Bellow, MD - [phone] Glendon Axe, MD - [phone].  Electronic Signatures: Armstead Peaks (MD)  (Signed 27-Jan-16 15:13)  Authored: HPI, Diagnosis, Past Hx, PFSH, Allergies, Home Meds, ROS, Nursing Notes, Physical Exam, Other Results, Relevent Results, Encounter Assessment and Plan, Fax to Physician   Last Updated: 27-Jan-16 15:13 by Armstead Peaks (MD)

## 2014-07-14 ENCOUNTER — Ambulatory Visit: Payer: Self-pay | Admitting: Radiation Oncology

## 2014-07-14 ENCOUNTER — Ambulatory Visit
Admission: RE | Admit: 2014-07-14 | Discharge: 2014-07-14 | Disposition: A | Discharge status: Home / Self Care | Payer: 59 | Source: Ambulatory Visit | Attending: Radiation Oncology | Admitting: Radiation Oncology

## 2014-07-14 ENCOUNTER — Encounter: Payer: Self-pay | Admitting: Radiation Oncology

## 2014-07-14 VITALS — BP 137/87 | HR 92 | Temp 97.5°F | Resp 18 | Wt 247.9 lb

## 2014-07-14 DIAGNOSIS — C50911 Malignant neoplasm of unspecified site of right female breast: Secondary | ICD-10-CM

## 2014-07-14 NOTE — Progress Notes (Signed)
Radiation Oncology Follow up Note  Name: Felicia Kidd   Date:   07/14/2014 MRN:  [id number] DOB: Jan 16, 1961    This 54 y.o. female presents to the clinic today for follow up. Breast cancer  REFERRING PROVIDER: Glendon Axe, MD  HPI: Patient is a 54 year old female now out 1 month having completed radiation therapy to her right breast and peripheral lymphatics for 1.4 cm invasive mammary carcinoma with 6 of 14 axillary lymph nodes positive for metastatic disease with extracapsular extension. Tumor was triple negative. She had positive lymphovascular invasion. She was treated with dose dense Cytoxan and Adriamycin followed by carbotaxol. She then underwent revision therapy to her right breast and peripheral lymphatics is now seen out 1 month. She is doing well. She specifically denies breast tenderness cough or bone pain. She's been having a little congestion and cough has asked for refill of her hydrocodone cough suppressant as well as an antibiotic. She specifically denies breast tenderness or bone pain..  COMPLICATIONS OF TREATMENT: none  FOLLOW UP COMPLIANCE: keeps appointments   PHYSICAL EXAM:  BP 137/87 mmHg  Pulse 92  Temp(Src) 97.5 F (36.4 C)  Resp 18  Wt 247 lb 14.5 oz (112.45 kg) Well-developed slightly obese female in NAD.Lungs are clear to A&P cardiac examination essentially unremarkable with regular rate and rhythm. No dominant mass or nodularity is noted in either breast in 2 positions examined. Incision is well-healed. No axillary or supraclavicular adenopathy is appreciated. Cosmetic result is excellent. Well-developed well-nourished patient in NAD. HEENT reveals PERLA, EOMI, discs not visualized.  Oral cavity is clear. No oral mucosal lesions are identified. Neck is clear without evidence of cervical or supraclavicular adenopathy. Lungs are clear to A&P. Cardiac examination is essentially unremarkable with regular rate and rhythm without murmur rub or thrill. Abdomen is  benign with no organomegaly or masses noted. Motor sensory and DTR levels are equal and symmetric in the upper and lower extremities. Cranial nerves II through XII are grossly intact. Proprioception is intact. No peripheral adenopathy or edema is identified. No motor or sensory levels are noted. Crude visual fields are within normal range.   RADIOLOGY RESULTS: No follow-up films to review  PLAN: At this time she continues to do well with no evidence of disease recovering nicely 1 month out from radiation therapy. I am starting her on Z-Pak for her upper respiratory complaints as well as refilling her hydrocodone cough suppressant. I am please were overall progress. She is triple negative so does not receive aromatase inhibitor therapy. I have asked to see around 6 months for follow-up. She knows to call sooner with any concerns. She continues close follow-up care with medical oncology and surgeon.  I would like to take this opportunity for allowing me to participate in the care of your patient.Armstead Peaks., MD

## 2014-08-03 DIAGNOSIS — K219 Gastro-esophageal reflux disease without esophagitis: Secondary | ICD-10-CM | POA: Insufficient documentation

## 2014-08-03 DIAGNOSIS — E782 Mixed hyperlipidemia: Secondary | ICD-10-CM | POA: Insufficient documentation

## 2014-08-03 DIAGNOSIS — I10 Essential (primary) hypertension: Secondary | ICD-10-CM | POA: Insufficient documentation

## 2014-08-03 DIAGNOSIS — E119 Type 2 diabetes mellitus without complications: Secondary | ICD-10-CM | POA: Insufficient documentation

## 2014-08-10 ENCOUNTER — Other Ambulatory Visit: Payer: Self-pay

## 2014-08-10 DIAGNOSIS — C50911 Malignant neoplasm of unspecified site of right female breast: Secondary | ICD-10-CM

## 2014-08-11 ENCOUNTER — Inpatient Hospital Stay: Payer: 59 | Attending: Oncology

## 2014-08-11 DIAGNOSIS — C801 Malignant (primary) neoplasm, unspecified: Secondary | ICD-10-CM

## 2014-08-11 MED ORDER — HEPARIN SOD (PORK) LOCK FLUSH 100 UNIT/ML IV SOLN
500.0000 [IU] | Freq: Once | INTRAVENOUS | Status: AC
  Filled 2014-08-11: qty 5

## 2014-08-11 MED ORDER — SODIUM CHLORIDE 0.9 % IJ SOLN
10.0000 mL | INTRAMUSCULAR | Status: AC | PRN
  Filled 2014-08-11: qty 10

## 2014-09-23 ENCOUNTER — Inpatient Hospital Stay: Payer: 59 | Attending: Oncology

## 2014-09-23 DIAGNOSIS — C50919 Malignant neoplasm of unspecified site of unspecified female breast: Secondary | ICD-10-CM | POA: Diagnosis not present

## 2014-09-23 DIAGNOSIS — C801 Malignant (primary) neoplasm, unspecified: Secondary | ICD-10-CM

## 2014-09-23 DIAGNOSIS — Z171 Estrogen receptor negative status [ER-]: Secondary | ICD-10-CM | POA: Insufficient documentation

## 2014-09-23 DIAGNOSIS — Z452 Encounter for adjustment and management of vascular access device: Secondary | ICD-10-CM | POA: Insufficient documentation

## 2014-09-23 MED ORDER — SODIUM CHLORIDE 0.9 % IJ SOLN
10.0000 mL | INTRAMUSCULAR | Status: DC | PRN
  Administered 2014-09-23: 10 mL via INTRAVENOUS
  Filled 2014-09-23: qty 10

## 2014-09-23 MED ORDER — HEPARIN SOD (PORK) LOCK FLUSH 100 UNIT/ML IV SOLN
500.0000 [IU] | Freq: Once | INTRAVENOUS | Status: AC
  Administered 2014-09-23: 500 [IU] via INTRAVENOUS
  Filled 2014-09-23: qty 5

## 2014-09-29 ENCOUNTER — Ambulatory Visit (INDEPENDENT_AMBULATORY_CARE_PROVIDER_SITE_OTHER): Payer: 59 | Admitting: General Surgery

## 2014-09-29 ENCOUNTER — Encounter: Payer: Self-pay | Admitting: General Surgery

## 2014-09-29 VITALS — BP 140/80 | HR 82 | Resp 12 | Ht 62.0 in | Wt 250.0 lb

## 2014-09-29 DIAGNOSIS — Z1211 Encounter for screening for malignant neoplasm of colon: Secondary | ICD-10-CM | POA: Diagnosis not present

## 2014-09-29 DIAGNOSIS — C50311 Malignant neoplasm of lower-inner quadrant of right female breast: Secondary | ICD-10-CM

## 2014-09-29 NOTE — Progress Notes (Signed)
Patient ID: Felicia Kidd, female   DOB: [birth year], 54 y.o.   MRN: [id number]  Chief Complaint  Patient presents with  . Follow-up    breast cancer and mammogram    HPI Felicia Kidd is a 54 y.o. female.  who presents for her follow up mammogram , breast cancer and breast evaluation. The most recent mammogram was done on 09-23-14.  Patient does perform regular self breast checks and gets regular mammograms done.  She does notice a little tenderness in right axilla area.  She completed her radiation June 22 2014. She finished her chemotherapy on 03/30/14.  HPI  Past Medical History  Diagnosis Date  . Diabetes mellitus without complication   . Hyperlipidemia   . Hypertension   . Thyroid disease   . 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    . Breast cancer     Past Surgical History  Procedure Laterality Date  . Dilation and curettage of uterus      Dr Vernie Ammons  . Portacath placement  11-02-13  . Breast surgery Right 09-27-13    Wide excision of Right breast lesion with attempted sentinel node biopsy followed by axillary dissection    No family history on file.  Social History History  Substance Use Topics  . Smoking status: Never Smoker   . Smokeless tobacco: Never Used  . Alcohol Use: No    Allergies  Allergen Reactions  . Other Rash and Swelling    PNEUMOVAX.    Current Outpatient Prescriptions  Medication Sig Dispense Refill  . acetaminophen (TYLENOL) 325 MG tablet Take 650 mg by mouth every 6 (six) hours as needed.    Marland Kitchen atorvastatin (LIPITOR) 40 MG tablet Take 40 mg by mouth daily.    . fluticasone (FLONASE) 50 MCG/ACT nasal spray Place 2 sprays into both nostrils daily.    . Halcinonide (HALOG) 0.1 % CREA Apply 1 Tube topically 2 (two) times daily.    Marland Kitchen loratadine (CLARITIN) 10 MG tablet Take 1 tablet by mouth  daily.    . metFORMIN (GLUCOPHAGE) 500 MG tablet Take 1 tablet by mouth daily at 6 (six) AM.    . omeprazole (PRILOSEC) 20 MG capsule Take 20 mg by mouth daily.    Marland Kitchen telmisartan-hydrochlorothiazide (MICARDIS HCT) 40-12.5 MG per tablet Take 1 tablet by mouth daily.  11   No current facility-administered medications for this visit.   Facility-Administered Medications Ordered in Other Visits  Medication Dose Route Frequency Provider Last Rate Last Dose  . heparin lock flush 100 unit/mL  500 Units Intravenous Once Forest Gleason, MD      . sodium chloride 0.9 % injection 10 mL  10 mL Intravenous PRN Forest Gleason, MD        Review of Systems Review of Systems  Constitutional: Negative.   Respiratory: Negative.   Cardiovascular: Negative.     Blood pressure 140/80, pulse 82, resp. rate 12, height 5\' 2"  (1.575 m), weight 250 lb (113.399 kg).  Physical Exam Physical Exam  Constitutional: She is oriented to person, place, and time. She appears well-developed and well-nourished.  HENT:  Mouth/Throat: Oropharynx is clear and moist.  Eyes: Conjunctivae are normal. No scleral icterus.  Neck: Neck supple.  Cardiovascular: Normal rate, regular rhythm and normal heart sounds.   Pulmonary/Chest: Effort normal and breath sounds normal. Right breast exhibits skin change. Right  breast exhibits no inverted nipple, no mass, no nipple discharge and no tenderness. Left breast exhibits no inverted nipple, no mass, no nipple discharge, no skin change and no tenderness.  Faint redness and  Peau d'orange changes right breast and well healed incision 6 o'clock with thickening 3 cm along scar.  Lymphadenopathy:    She has no cervical adenopathy.    She has no axillary adenopathy.  Neurological: She is alert and oriented to person, place, and time.  Skin: Skin is warm and dry.  Psychiatric: She has a normal mood and affect.    Data Reviewed Bilateral mammograms dated 09/23/2014 show architectural changes  consistent with previous surgery and radiation. BI-RADS-3. Left breast is unremarkable.  Assessment    Stable breast exam post wide excision. Candidate for screening colonoscopy based on age and past history of breast cancer.    Plan        Colonoscopy with possible biopsy/polypectomy prn: Information regarding the procedure, including its potential risks and complications (including but not limited to perforation of the bowel, which may require emergency surgery to repair, and bleeding) was verbally given to the patient. Educational information regarding lower instestinal endoscopy was given to the patient. Written instructions for how to complete the bowel prep using Miralax were provided. The importance of drinking ample fluids to avoid dehydration as a result of the prep emphasized.  The patient has been asked to return to the office in 6 months with a right breast diagnostic mammogram.  Patient requested to notify the office once she is ready to proceed with colonoscopy. Miralax prescription will be sent in once date is arranged.   PCP:  Despina Arias 10/01/2014, 9:09 AM

## 2014-09-29 NOTE — Patient Instructions (Addendum)
The patient has been asked to return to the office in 6 months with a right breast diagnostic mammogram.  Colonoscopy A colonoscopy is an exam to look at the entire large intestine (colon). This exam can help find problems such as tumors, polyps, inflammation, and areas of bleeding. The exam takes about 1 hour.  LET [facility] CARE PROVIDER KNOW ABOUT:   Any allergies you have.  All medicines you are taking, including vitamins, herbs, eye drops, creams, and over-the-counter medicines.  Previous problems you or members of your family have had with the use of anesthetics.  Any blood disorders you have.  Previous surgeries you have had.  Medical conditions you have. RISKS AND COMPLICATIONS  Generally, this is a safe procedure. However, as with any procedure, complications can occur. Possible complications include:  Bleeding.  Tearing or rupture of the colon wall.  Reaction to medicines given during the exam.  Infection (rare). BEFORE THE PROCEDURE   Ask your health care provider about changing or stopping your regular medicines.  You may be prescribed an oral bowel prep. This involves drinking a large amount of medicated liquid, starting the day before your procedure. The liquid will cause you to have multiple loose stools until your stool is almost clear or light green. This cleans out your colon in preparation for the procedure.  Do not eat or drink anything else once you have started the bowel prep, unless your health care provider tells you it is safe to do so.  Arrange for someone to drive you home after the procedure. PROCEDURE   You will be given medicine to help you relax (sedative).  You will lie on your side with your knees bent.  A long, flexible tube with a light and camera on the end (colonoscope) will be inserted through the rectum and into the colon. The camera sends video back to a computer screen as it moves through the colon. The colonoscope also releases  carbon dioxide gas to inflate the colon. This helps your health care provider see the area better.  During the exam, your health care provider may take a small tissue sample (biopsy) to be examined under a microscope if any abnormalities are found.  The exam is finished when the entire colon has been viewed. AFTER THE PROCEDURE   Do not drive for 24 hours after the exam.  You may have a small amount of blood in your stool.  You may pass moderate amounts of gas and have mild abdominal cramping or bloating. This is caused by the gas used to inflate your colon during the exam.  Ask when your test results will be ready and how you will get your results. Make sure you get your test results. Document Released: 02/23/2000 Document Revised: 12/16/2012 Document Reviewed: 11/02/2012 [facility] Patient Information 2015 [place], [place]. This information is not intended to replace advice given to you by your health care provider. Make sure you discuss any questions you have with your health care provider.  Patient requested to notify the office once she is ready to proceed with colonoscopy. Miralax prescription will be sent in once date is arranged.

## 2014-10-01 ENCOUNTER — Other Ambulatory Visit: Payer: Self-pay | Admitting: General Surgery

## 2014-10-01 DIAGNOSIS — Z1211 Encounter for screening for malignant neoplasm of colon: Secondary | ICD-10-CM | POA: Insufficient documentation

## 2014-10-21 ENCOUNTER — Other Ambulatory Visit: Payer: Self-pay | Admitting: *Deleted

## 2014-10-21 DIAGNOSIS — C50311 Malignant neoplasm of lower-inner quadrant of right female breast: Secondary | ICD-10-CM

## 2014-10-25 ENCOUNTER — Other Ambulatory Visit: Payer: Self-pay | Admitting: *Deleted

## 2014-10-25 DIAGNOSIS — C50311 Malignant neoplasm of lower-inner quadrant of right female breast: Secondary | ICD-10-CM

## 2014-10-26 ENCOUNTER — Inpatient Hospital Stay: Payer: 59

## 2014-10-26 ENCOUNTER — Inpatient Hospital Stay: Payer: 59 | Attending: Oncology

## 2014-10-26 ENCOUNTER — Inpatient Hospital Stay (HOSPITAL_BASED_OUTPATIENT_CLINIC_OR_DEPARTMENT_OTHER): Payer: 59 | Admitting: Oncology

## 2014-10-26 VITALS — BP 136/85 | HR 83 | Temp 96.8°F | Wt 248.4 lb

## 2014-10-26 DIAGNOSIS — Z923 Personal history of irradiation: Secondary | ICD-10-CM | POA: Diagnosis not present

## 2014-10-26 DIAGNOSIS — Z9221 Personal history of antineoplastic chemotherapy: Secondary | ICD-10-CM | POA: Insufficient documentation

## 2014-10-26 DIAGNOSIS — C50911 Malignant neoplasm of unspecified site of right female breast: Secondary | ICD-10-CM | POA: Diagnosis not present

## 2014-10-26 DIAGNOSIS — Z171 Estrogen receptor negative status [ER-]: Secondary | ICD-10-CM | POA: Insufficient documentation

## 2014-10-26 DIAGNOSIS — E785 Hyperlipidemia, unspecified: Secondary | ICD-10-CM | POA: Diagnosis not present

## 2014-10-26 DIAGNOSIS — I10 Essential (primary) hypertension: Secondary | ICD-10-CM | POA: Diagnosis not present

## 2014-10-26 DIAGNOSIS — Z79899 Other long term (current) drug therapy: Secondary | ICD-10-CM | POA: Insufficient documentation

## 2014-10-26 DIAGNOSIS — C50311 Malignant neoplasm of lower-inner quadrant of right female breast: Secondary | ICD-10-CM

## 2014-10-26 DIAGNOSIS — E079 Disorder of thyroid, unspecified: Secondary | ICD-10-CM | POA: Insufficient documentation

## 2014-10-26 DIAGNOSIS — E119 Type 2 diabetes mellitus without complications: Secondary | ICD-10-CM | POA: Insufficient documentation

## 2014-10-26 LAB — COMPREHENSIVE METABOLIC PANEL
ALT: 16 U/L (ref 14–54)
ANION GAP: 4 — AB (ref 5–15)
AST: 18 U/L (ref 15–41)
Albumin: 3.9 g/dL (ref 3.5–5.0)
Alkaline Phosphatase: 111 U/L (ref 38–126)
BUN: 15 mg/dL (ref 6–20)
CHLORIDE: 105 mmol/L (ref 101–111)
CO2: 27 mmol/L (ref 22–32)
Calcium: 8.8 mg/dL — ABNORMAL LOW (ref 8.9–10.3)
Creatinine, Ser: 0.66 mg/dL (ref 0.44–1.00)
Glucose, Bld: 161 mg/dL — ABNORMAL HIGH (ref 65–99)
POTASSIUM: 3.9 mmol/L (ref 3.5–5.1)
Sodium: 136 mmol/L (ref 135–145)
Total Bilirubin: 0.5 mg/dL (ref 0.3–1.2)
Total Protein: 6.8 g/dL (ref 6.5–8.1)

## 2014-10-26 LAB — CBC WITH DIFFERENTIAL/PLATELET
Basophils Absolute: 0 10*3/uL (ref 0–0.1)
Basophils Relative: 0 %
EOS ABS: 0.2 10*3/uL (ref 0–0.7)
Eosinophils Relative: 3 %
HCT: 36.7 % (ref 35.0–47.0)
Hemoglobin: 12.6 g/dL (ref 12.0–16.0)
LYMPHS ABS: 2 10*3/uL (ref 1.0–3.6)
LYMPHS PCT: 23 %
MCH: 30.6 pg (ref 26.0–34.0)
MCHC: 34.3 g/dL (ref 32.0–36.0)
MCV: 89.2 fL (ref 80.0–100.0)
MONO ABS: 0.5 10*3/uL (ref 0.2–0.9)
Monocytes Relative: 6 %
Neutro Abs: 5.9 10*3/uL (ref 1.4–6.5)
Neutrophils Relative %: 68 %
PLATELETS: 290 10*3/uL (ref 150–440)
RBC: 4.11 MIL/uL (ref 3.80–5.20)
RDW: 12.6 % (ref 11.5–14.5)
WBC: 8.6 10*3/uL (ref 3.6–11.0)

## 2014-10-26 MED ORDER — HEPARIN SOD (PORK) LOCK FLUSH 100 UNIT/ML IV SOLN
INTRAVENOUS | Status: AC
  Filled 2014-10-26: qty 5

## 2014-10-26 MED ORDER — HEPARIN SOD (PORK) LOCK FLUSH 100 UNIT/ML IV SOLN
500.0000 [IU] | Freq: Once | INTRAVENOUS | Status: AC
  Administered 2014-10-26: 500 [IU] via INTRAVENOUS

## 2014-10-26 MED ORDER — SODIUM CHLORIDE 0.9 % IJ SOLN
10.0000 mL | INTRAMUSCULAR | Status: AC | PRN
  Administered 2014-10-26: 10 mL via INTRAVENOUS
  Filled 2014-10-26: qty 10

## 2014-10-26 NOTE — Progress Notes (Signed)
[place] @ [facility] Telephone:(336) [phone]  Fax:(336) [place]: 1961-03-01  MR#: [id number]  OVZ#:[id number]  Patient Care Team: Glendon Axe, MD as PCP - General (Internal Medicine) Eusebio Me, MD (Unknown Physician Specialty) Robert Bellow, MD (General Surgery)  CHIEF COMPLAINT:  Chief Complaint  Patient presents with  . Follow-up   1. Carcinoma of breast pT1c oN2a Mo  stage  IIIa. extensive lymphovascular invasion 6/14 lymph nodes uninvolved with extra capsular  invasion.Marland Kitchen     2.NEGATIVE FOR BRCA 1  AND 2 MUTATION  estrogen and progesterone receptor negative HER-2/neu receptor negative (triple negative disease) diagnosis in July of 2015.  Status post lumpectomy and axillary node dissection, 2. Started on chemotherapy with NSABP protocol is dose dense Cytoxan Adriamycin August of 2015 3.as per protocol patient was switched  to  carboplatinum and Taxol fromOctober 28, 2015 4.Patient has finished carboplatinum and Taxol in March 25, 2014 Would start radiation therapy to the breast in February of 2016  No flowsheet data found.  INTERVAL HISTORY:  54 year old lady who has a triple negative carcinoma breast came today further follow-up. Patient has been doing fairly good.  Has been treated according to NSABP protocol. No nausea no vomiting no diarrhea Appetite has been fairly good No tingling numbness REVIEW OF SYSTEMS:   GENERAL:  Feels good.  Active.  No fevers, sweats or weight loss. PERFORMANCE STATUS (ECOG):  0 HEENT:  No visual changes, runny nose, sore throat, mouth sores or tenderness. Lungs: No shortness of breath or cough.  No hemoptysis. Cardiac:  No chest pain, palpitations, orthopnea, or PND. GI:  No nausea, vomiting, diarrhea, constipation, melena or hematochezia. GU:  No urgency, frequency, dysuria, or hematuria. Musculoskeletal:  No back pain.  No joint pain.  No muscle tenderness. Extremities:  No pain or  swelling. Skin:  No rashes or skin changes. Neuro:  No headache, numbness or weakness, balance or coordination issues. Endocrine:  No diabetes, thyroid issues, hot flashes or night sweats. Psych:  No mood changes, depression or anxiety. Pain:  No focal pain. Review of systems:  All other systems reviewed and found to be negative. As per HPI. Otherwise, a complete review of systems is negatve.  PAST MEDICAL HISTORY: Past Medical History  Diagnosis Date  . Diabetes mellitus without complication   . Hyperlipidemia   . Hypertension   . Thyroid disease   . 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    . Breast cancer     PAST SURGICAL HISTORY: Past Surgical History  Procedure Laterality Date  . Dilation and curettage of uterus      Dr Vernie Ammons  . Portacath placement  11-02-13  . Breast surgery Right 09-27-13    Wide excision of Right breast lesion with attempted sentinel node biopsy followed by axillary dissection    FAMILY HISTORY No family history on file.  ADVANCED DIRECTIVES:  No flowsheet data found.  HEALTH MAINTENANCE: Social History  Substance Use Topics  . Smoking status: Never Smoker   . Smokeless tobacco: Never Used  . Alcohol Use: No      Allergies  Allergen Reactions  . Other Rash and Swelling    PNEUMOVAX.    Current Outpatient Prescriptions  Medication Sig Dispense Refill  . acetaminophen (TYLENOL) 325 MG tablet Take 650 mg by mouth every 6 (  six) hours as needed.    Marland Kitchen atorvastatin (LIPITOR) 40 MG tablet Take 40 mg by mouth daily.    . fluticasone (FLONASE) 50 MCG/ACT nasal spray Place 2 sprays into both nostrils daily.    Marland Kitchen loratadine (CLARITIN) 10 MG tablet Take 1 tablet by mouth daily.    . metFORMIN (GLUCOPHAGE) 500 MG tablet Take 1 tablet by mouth daily at 6 (six) AM.    . omeprazole (PRILOSEC) 20 MG capsule  Take 20 mg by mouth daily.    Marland Kitchen telmisartan-hydrochlorothiazide (MICARDIS HCT) 40-12.5 MG per tablet Take 1 tablet by mouth daily.  11  . Halcinonide (HALOG) 0.1 % CREA Apply 1 Tube topically 2 (two) times daily.     No current facility-administered medications for this visit.   Facility-Administered Medications Ordered in Other Visits  Medication Dose Route Frequency Provider Last Rate Last Dose  . heparin lock flush 100 unit/mL  500 Units Intravenous Once Forest Gleason, MD      . sodium chloride 0.9 % injection 10 mL  10 mL Intravenous PRN Forest Gleason, MD      . sodium chloride 0.9 % injection 10 mL  10 mL Intravenous PRN Forest Gleason, MD   10 mL at 10/26/14 0940    OBJECTIVE:  Filed Vitals:   10/26/14 1011  BP: 136/85  Pulse: 83  Temp: 96.8 F (36 C)     Body mass index is 45.42 kg/(m^2).    ECOG FS:0 - Asymptomatic  PHYSICAL EXAM: General  status: Performance status is good.  Patient has not lost significant weight HEENT: No evidence of stomatitis. Sclera and conjunctivae :: No jaundice.   pale looking. Lungs: Air  entry equal on both sides.  No rhonchi.  No rales.  Cardiac: Heart sounds are normal.  No pericardial rub.  No murmur. Lymphatic system: Cervical, axillary, inguinal, lymph nodes not palpable GI: Abdomen is soft.  No ascites.  Liver spleen not palpable.  No tenderness.  Bowel sounds are within normal limit Lower extremity: No edema Neurological system: Higher functions, cranial nerves intact no evidence of peripheral neuropathy. Skin: No rash.  No ecchymosis.. Right breast: Status post lumpectomy radiation therapy no evidence of recurrent disease left breast free of masses Port site within normal limit  LAB RESULTS:  CBC Latest Ref Rng 10/26/2014 06/08/2014  WBC 3.6 - 11.0 K/uL 8.6 6.3  Hemoglobin 12.0 - 16.0 g/dL 12.6 12.0  Hematocrit 35.0 - 47.0 % 36.7 34.4(L)  Platelets 150 - 440 K/uL 290 256    Infusion on 10/26/2014  Component Date Value Ref Range Status   . WBC 10/26/2014 8.6  3.6 - 11.0 K/uL Final   A-LINE DRAW  . RBC 10/26/2014 4.11  3.80 - 5.20 MIL/uL Final  . Hemoglobin 10/26/2014 12.6  12.0 - 16.0 g/dL Final  . HCT 10/26/2014 36.7  35.0 - 47.0 % Final  . MCV 10/26/2014 89.2  80.0 - 100.0 fL Final  . MCH 10/26/2014 30.6  26.0 - 34.0 pg Final  . MCHC 10/26/2014 34.3  32.0 - 36.0 g/dL Final  . RDW 10/26/2014 12.6  11.5 - 14.5 % Final  . Platelets 10/26/2014 290  150 - 440 K/uL Final  . Neutrophils Relative % 10/26/2014 68   Final  . Neutro Abs 10/26/2014 5.9  1.4 - 6.5 K/uL Final  . Lymphocytes Relative 10/26/2014 23   Final  . Lymphs Abs 10/26/2014 2.0  1.0 - 3.6 K/uL Final  . Monocytes Relative 10/26/2014 6   Final  .  Monocytes Absolute 10/26/2014 0.5  0.2 - 0.9 K/uL Final  . Eosinophils Relative 10/26/2014 3   Final  . Eosinophils Absolute 10/26/2014 0.2  0 - 0.7 K/uL Final  . Basophils Relative 10/26/2014 0   Final  . Basophils Absolute 10/26/2014 0.0  0 - 0.1 K/uL Final  . Sodium 10/26/2014 136  135 - 145 mmol/L Final  . Potassium 10/26/2014 3.9  3.5 - 5.1 mmol/L Final  . Chloride 10/26/2014 105  101 - 111 mmol/L Final  . CO2 10/26/2014 27  22 - 32 mmol/L Final  . Glucose, Bld 10/26/2014 161* 65 - 99 mg/dL Final  . BUN 10/26/2014 15  6 - 20 mg/dL Final  . Creatinine, Ser 10/26/2014 0.66  0.44 - 1.00 mg/dL Final  . Calcium 10/26/2014 8.8* 8.9 - 10.3 mg/dL Final  . Total Protein 10/26/2014 6.8  6.5 - 8.1 g/dL Final  . Albumin 10/26/2014 3.9  3.5 - 5.0 g/dL Final  . AST 10/26/2014 18  15 - 41 U/L Final  . ALT 10/26/2014 16  14 - 54 U/L Final  . Alkaline Phosphatase 10/26/2014 111  38 - 126 U/L Final  . Total Bilirubin 10/26/2014 0.5  0.3 - 1.2 mg/dL Final  . GFR calc non Af Amer 10/26/2014 >60  >60 mL/min Final  . GFR calc Af Amer 10/26/2014 >60  >60 mL/min Final   Comment: (NOTE) The eGFR has been calculated using the CKD EPI equation. This calculation has not been validated in all clinical situations. eGFR's persistently  <60 mL/min signify possible Chronic Kidney Disease.   . Anion gap 10/26/2014 4* 5 - 15 Final        ASSESSMENT: 1.  Carcinoma of right breast triple negative stage II disease positive lymph node On clinical ground there is no evidence of recurrent disease  MEDICAL DECISION MAKING:  Patient continues to complain port bothering her so we will try to get that removed. Reevaluation as per protocol All lab data has been reviewed  Patient expressed understanding and was in agreement with this plan. She also understands that She can call clinic at any time with any questions, concerns, or complaints.    No matching staging information was found for the patient.  Forest Gleason, MD   10/26/2014 11:19 AM

## 2014-10-26 NOTE — Progress Notes (Signed)
Patient does not have living will.  Materials given.  Never smoked.

## 2014-11-02 ENCOUNTER — Ambulatory Visit (INDEPENDENT_AMBULATORY_CARE_PROVIDER_SITE_OTHER): Payer: 59 | Admitting: General Surgery

## 2014-11-02 ENCOUNTER — Encounter: Payer: Self-pay | Admitting: General Surgery

## 2014-11-02 VITALS — BP 150/88 | HR 78 | Resp 12 | Wt 246.0 lb

## 2014-11-02 DIAGNOSIS — C50911 Malignant neoplasm of unspecified site of right female breast: Secondary | ICD-10-CM

## 2014-11-02 NOTE — Progress Notes (Signed)
Patient ID: Felicia Kidd, female   DOB: [birth year], 54 y.o.   MRN: [id number]  Chief Complaint  Patient presents with  . Procedure    HPI Felicia Kidd is a 54 y.o. female.  Here today for port removal. No new complaints.   HPI  Past Medical History  Diagnosis Date  . Diabetes mellitus without complication   . Hyperlipidemia   . Hypertension   . Thyroid disease   . 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    . Breast cancer     Past Surgical History  Procedure Laterality Date  . Dilation and curettage of uterus      Dr Vernie Ammons  . Portacath placement  11-02-13  . Breast surgery Right 09-27-13    Wide excision of Right breast lesion with attempted sentinel node biopsy followed by axillary dissection    No family history on file.  Social History Social History  Substance Use Topics  . Smoking status: Never Smoker   . Smokeless tobacco: Never Used  . Alcohol Use: No    Allergies  Allergen Reactions  . Other Rash and Swelling    PNEUMOVAX.    Current Outpatient Prescriptions  Medication Sig Dispense Refill  . acetaminophen (TYLENOL) 325 MG tablet Take 650 mg by mouth every 6 (six) hours as needed.    Marland Kitchen atorvastatin (LIPITOR) 40 MG tablet Take 40 mg by mouth daily.    . fluticasone (FLONASE) 50 MCG/ACT nasal spray Place 2 sprays into both nostrils daily.    . Halcinonide (HALOG) 0.1 % CREA Apply 1 Tube topically 2 (two) times daily.    Marland Kitchen loratadine (CLARITIN) 10 MG tablet Take 1 tablet by mouth daily.    . metFORMIN (GLUCOPHAGE) 500 MG tablet Take 1 tablet by mouth daily at 6 (six) AM.    . omeprazole (PRILOSEC) 20 MG capsule Take 20 mg by mouth daily.    Marland Kitchen telmisartan-hydrochlorothiazide (MICARDIS HCT) 40-12.5 MG per tablet Take 1 tablet by mouth daily.  11   No current facility-administered medications for this  visit.   Facility-Administered Medications Ordered in Other Visits  Medication Dose Route Frequency Provider Last Rate Last Dose  . heparin lock flush 100 unit/mL  500 Units Intravenous Once Forest Gleason, MD      . sodium chloride 0.9 % injection 10 mL  10 mL Intravenous PRN Forest Gleason, MD      . sodium chloride 0.9 % injection 10 mL  10 mL Intravenous PRN Forest Gleason, MD   10 mL at 10/26/14 0940    Review of Systems Review of Systems  Constitutional: Negative.   Respiratory: Negative.   Cardiovascular: Negative.     Blood pressure 150/88, pulse 78, resp. rate 12, weight 246 lb (111.585 kg).  Physical Exam Physical Exam  Pulmonary/Chest:      Data Reviewed Port removal order received. Consent signed.  Assessment    Completion of need for central venous access.    Plan    The area was prepped with alcohol followed by the instillation of 10 mL of 0.5% Xylocaine with 0.25% Marcaine with 1 200,000 of epinephrine. Chlor prep was applied to the skin. He old incision was opened. The port was removed and transfixing sutures removed. The catheter was extracted with an intact tip. No bleeding was noted. The deep tissue was approximated with a running  3-0 Vicryls suture. The skin was closed with a running 3-0 Vicryls septic suture. Benzoin, Steri-Strips, Telfa and Tegaderm applied.  The patient tolerated the procedure well. Post procedure wound care instructions reviewed with the nurse. Follow-up in one week for nursing wound check.     PCP:  Despina Arias 11/04/2014, 9:46 AM

## 2014-11-02 NOTE — Patient Instructions (Addendum)
Keep area clean Ice pack as needed for comfort Follow up in one week with the nurse.

## 2014-11-04 ENCOUNTER — Other Ambulatory Visit: Payer: Self-pay | Admitting: Internal Medicine

## 2014-11-04 DIAGNOSIS — R748 Abnormal levels of other serum enzymes: Secondary | ICD-10-CM

## 2014-11-08 ENCOUNTER — Ambulatory Visit: Payer: 59

## 2014-11-10 ENCOUNTER — Ambulatory Visit (INDEPENDENT_AMBULATORY_CARE_PROVIDER_SITE_OTHER): Payer: 59 | Admitting: *Deleted

## 2014-11-10 DIAGNOSIS — C50911 Malignant neoplasm of unspecified site of right female breast: Secondary | ICD-10-CM

## 2014-11-10 NOTE — Progress Notes (Signed)
Patient came in today for a wound check, post port removal.  The wound is clean, with no signs of infection noted. Irritation noted from a band aid she had been using. Steri strips removed, no dressing needed. Follow up as scheduled.

## 2014-11-10 NOTE — Patient Instructions (Signed)
The patient is aware to call back for any questions or concerns.  

## 2014-11-15 ENCOUNTER — Ambulatory Visit
Admission: RE | Admit: 2014-11-15 | Discharge: 2014-11-15 | Disposition: A | Discharge status: Home / Self Care | Payer: 59 | Source: Ambulatory Visit | Attending: Internal Medicine | Admitting: Internal Medicine

## 2014-11-15 DIAGNOSIS — R748 Abnormal levels of other serum enzymes: Secondary | ICD-10-CM | POA: Diagnosis present

## 2014-11-15 DIAGNOSIS — K8020 Calculus of gallbladder without cholecystitis without obstruction: Secondary | ICD-10-CM | POA: Diagnosis not present

## 2014-11-16 ENCOUNTER — Encounter: Payer: Self-pay | Admitting: Oncology

## 2015-01-12 ENCOUNTER — Ambulatory Visit
Admission: RE | Admit: 2015-01-12 | Discharge: 2015-01-12 | Disposition: A | Discharge status: Home / Self Care | Payer: 59 | Source: Ambulatory Visit | Attending: Radiation Oncology | Admitting: Radiation Oncology

## 2015-01-12 ENCOUNTER — Encounter: Payer: Self-pay | Admitting: Radiation Oncology

## 2015-01-12 VITALS — BP 144/85 | HR 90 | Temp 96.3°F | Resp 18 | Wt 249.3 lb

## 2015-01-12 DIAGNOSIS — C50911 Malignant neoplasm of unspecified site of right female breast: Secondary | ICD-10-CM

## 2015-01-12 NOTE — Progress Notes (Signed)
Radiation Oncology Follow up Note  Name: Felicia Kidd   Date:   01/12/2015 MRN:  [id number] DOB: 16-Apr-1960    This 55 y.o. female presents to the clinic today for follow-up for stage IIIa breast cancer (T1 CN II a M0).  REFERRING PROVIDER: Glendon Axe, MD  HPI: Patient is a 54 year old female now out 7 months having completed radiation therapy. For triple negative invasive mammary carcinoma the right breast with extensive lymphovascular invasion of 6 and of 14 lymph nodes positive for metastatic disease with extracapsular extension. She was treated on NSABP protocol with dose dense Cytoxan and Adriamycin switched to carboplatinum and Taxol and underwent whole breast and peripheral lymphatic radiation. She seen today 7 months out and is doing well. She specifically denies breast tenderness cough or bone pain. Follow-up mammograms have been fine.  COMPLICATIONS OF TREATMENT: none  FOLLOW UP COMPLIANCE: keeps appointments   PHYSICAL EXAM:  BP 144/85 mmHg  Pulse 90  Temp(Src) 96.3 F (35.7 C)  Resp 18  Wt 249 lb 5.4 oz (113.1 kg) Lungs are clear to A&P cardiac examination essentially unremarkable with regular rate and rhythm. No dominant mass or nodularity is noted in either breast in 2 positions examined. Incision is well-healed. No axillary or supraclavicular adenopathy is appreciated. Cosmetic result is excellent. No evidence of lymphedema of her right upper extremity is noted. Well-developed well-nourished patient in NAD. HEENT reveals PERLA, EOMI, discs not visualized.  Oral cavity is clear. No oral mucosal lesions are identified. Neck is clear without evidence of cervical or supraclavicular adenopathy. Lungs are clear to A&P. Cardiac examination is essentially unremarkable with regular rate and rhythm without murmur rub or thrill. Abdomen is benign with no organomegaly or masses noted. Motor sensory and DTR levels are equal and symmetric in the upper and lower extremities. Cranial  nerves II through XII are grossly intact. Proprioception is intact. No peripheral adenopathy or edema is identified. No motor or sensory levels are noted. Crude visual fields are within normal range.  RADIOLOGY RESULTS: Mammograms have been requested for my review  PLAN: At the present time she is doing well with no evidence of disease now 7 months out. She continues close follow-up care with medical oncology. I have asked to see her back in 1 year for follow-up. She knows to call sooner with any concerns.  I would like to take this opportunity for allowing me to participate in the care of your patient.Armstead Peaks., MD

## 2015-02-23 ENCOUNTER — Encounter: Payer: Self-pay | Admitting: *Deleted

## 2015-02-23 ENCOUNTER — Encounter
Admission: RE | Disposition: A | Discharge status: Home / Self Care | Payer: Self-pay | Source: Ambulatory Visit | Attending: Unknown Physician Specialty

## 2015-02-23 ENCOUNTER — Ambulatory Visit: Payer: 59 | Admitting: Certified Registered Nurse Anesthetist

## 2015-02-23 ENCOUNTER — Ambulatory Visit
Admission: RE | Admit: 2015-02-23 | Discharge: 2015-02-23 | Disposition: A | Discharge status: Home / Self Care | Payer: 59 | Source: Ambulatory Visit | Attending: Unknown Physician Specialty | Admitting: Unknown Physician Specialty

## 2015-02-23 DIAGNOSIS — K5730 Diverticulosis of large intestine without perforation or abscess without bleeding: Secondary | ICD-10-CM | POA: Diagnosis not present

## 2015-02-23 DIAGNOSIS — Z6841 Body Mass Index (BMI) 40.0 and over, adult: Secondary | ICD-10-CM | POA: Diagnosis not present

## 2015-02-23 DIAGNOSIS — Z7951 Long term (current) use of inhaled steroids: Secondary | ICD-10-CM | POA: Diagnosis not present

## 2015-02-23 DIAGNOSIS — Z7984 Long term (current) use of oral hypoglycemic drugs: Secondary | ICD-10-CM | POA: Diagnosis not present

## 2015-02-23 DIAGNOSIS — E119 Type 2 diabetes mellitus without complications: Secondary | ICD-10-CM | POA: Diagnosis not present

## 2015-02-23 DIAGNOSIS — I10 Essential (primary) hypertension: Secondary | ICD-10-CM | POA: Insufficient documentation

## 2015-02-23 DIAGNOSIS — Z853 Personal history of malignant neoplasm of breast: Secondary | ICD-10-CM | POA: Diagnosis not present

## 2015-02-23 DIAGNOSIS — E785 Hyperlipidemia, unspecified: Secondary | ICD-10-CM | POA: Diagnosis not present

## 2015-02-23 DIAGNOSIS — Z79899 Other long term (current) drug therapy: Secondary | ICD-10-CM | POA: Insufficient documentation

## 2015-02-23 DIAGNOSIS — Z1211 Encounter for screening for malignant neoplasm of colon: Secondary | ICD-10-CM | POA: Diagnosis not present

## 2015-02-23 DIAGNOSIS — E079 Disorder of thyroid, unspecified: Secondary | ICD-10-CM | POA: Diagnosis not present

## 2015-02-23 HISTORY — PX: COLONOSCOPY: SHX5424

## 2015-02-23 LAB — GLUCOSE, CAPILLARY: Glucose-Capillary: 137 mg/dL — ABNORMAL HIGH (ref 65–99)

## 2015-02-23 SURGERY — COLONOSCOPY
Anesthesia: General

## 2015-02-23 MED ORDER — PROPOFOL 10 MG/ML IV BOLUS
INTRAVENOUS | Status: DC | PRN
  Administered 2015-02-23: 70 mg via INTRAVENOUS

## 2015-02-23 MED ORDER — MIDAZOLAM HCL 5 MG/5ML IJ SOLN
INTRAMUSCULAR | Status: DC | PRN
  Administered 2015-02-23: 1 mg via INTRAVENOUS

## 2015-02-23 MED ORDER — FENTANYL CITRATE (PF) 100 MCG/2ML IJ SOLN
INTRAMUSCULAR | Status: DC | PRN
  Administered 2015-02-23: 50 ug via INTRAVENOUS

## 2015-02-23 MED ORDER — SODIUM CHLORIDE 0.9 % IV SOLN
INTRAVENOUS | Status: DC
  Administered 2015-02-23: 08:00:00 via INTRAVENOUS

## 2015-02-23 MED ORDER — SODIUM CHLORIDE 0.9 % IV SOLN: INTRAVENOUS | Status: DC

## 2015-02-23 MED ORDER — LIDOCAINE HCL (CARDIAC) 20 MG/ML IV SOLN
INTRAVENOUS | Status: DC | PRN
  Administered 2015-02-23: 67 mg via INTRAVENOUS

## 2015-02-23 MED ORDER — PROPOFOL 500 MG/50ML IV EMUL
INTRAVENOUS | Status: DC | PRN
  Administered 2015-02-23: 100 ug/kg/min via INTRAVENOUS

## 2015-02-23 NOTE — Transfer of Care (Signed)
Immediate Anesthesia Transfer of Care Note  Patient: Felicia Kidd  Procedure(s) Performed: Procedure(s) with comments: COLONOSCOPY (N/A) -  Needs early AM - IDDM  Patient Location: PACU  Anesthesia Type:General  Level of Consciousness: awake and patient cooperative  Airway & Oxygen Therapy: Patient Spontanous Breathing and Patient connected to nasal cannula oxygen  Post-op Assessment: Report given to RN and Post -op Vital signs reviewed and stable  Post vital signs: Reviewed and stable  Last Vitals:  Filed Vitals:   02/23/15 0839 02/23/15 0841  BP: 118/70 118/70  Pulse: 87 85  Temp: 36 C 36 C  Resp: 16 16    Complications: No apparent anesthesia complications

## 2015-02-23 NOTE — H&P (Signed)
Primary Care Physician:  Glendon Axe, MD Primary Gastroenterologist:  Dr. Vira Agar  Pre-Procedure History & Physical: HPI:  Felicia Kidd is a 54 y.o. female is here for an colonoscopy.   Past Medical History  Diagnosis Date  . Diabetes mellitus without complication ([place])   . Hyperlipidemia   . Hypertension   . Thyroid disease   . Breast cancer of lower-inner quadrant of right female breast [facility]) June 2015    Triple negative, T1c, N2a.Treated with Cytoxan and Adriamycin in a dose dense fashion followed by carbotaxol.    . Breast cancer [facility])     Past Surgical History  Procedure Laterality Date  . Dilation and curettage of uterus      Dr Vernie Ammons  . Portacath placement  11-02-13  . Breast surgery Right 09-27-13    Wide excision of Right breast lesion with attempted sentinel node biopsy followed by axillary dissection    Prior to Admission medications   Medication Sig Start Date End Date Taking? Authorizing Provider  atorvastatin (LIPITOR) 40 MG tablet Take 40 mg by mouth daily.   Yes Historical Provider, MD  fluticasone (FLONASE) 50 MCG/ACT nasal spray Place 2 sprays into both nostrils daily.   Yes Historical Provider, MD  gabapentin (NEURONTIN) 100 MG capsule Take by mouth. 01/11/15 01/11/16 Yes Historical Provider, MD  metFORMIN (GLUCOPHAGE) 500 MG tablet Take 1 tablet by mouth daily at 6 (six) AM. 10/11/13  Yes Historical Provider, MD  omeprazole (PRILOSEC) 20 MG capsule Take 20 mg by mouth daily.   Yes Historical Provider, MD  polyethylene glycol powder (GLYCOLAX/MIRALAX) powder Take as directed for colonoscopy prep. 12/29/14  Yes Historical Provider, MD  telmisartan-hydrochlorothiazide (MICARDIS HCT) 40-12.5 MG per tablet Take 1 tablet by mouth daily. 09/19/14  Yes Historical Provider, MD  acetaminophen (TYLENOL) 325 MG tablet Take 650 mg by mouth every 6 (six) hours as needed.    Historical Provider, MD  Halcinonide (HALOG) 0.1 % CREA Apply 1 Tube topically 2 (two) times  daily.    Historical Provider, MD  loratadine (CLARITIN) 10 MG tablet Take 1 tablet by mouth daily.    Historical Provider, MD  traMADol (ULTRAM) 50 MG tablet TK 1 T PO NIGHTLY PRN FOR UP TO 5 DAYS 12/20/14   Historical Provider, MD  valACYclovir (VALTREX) 1000 MG tablet Take by mouth. 12/22/14 12/22/15  Historical Provider, MD    Allergies as of 12/30/2014 - Review Complete 11/16/2014  Allergen Reaction Noted  . Other Rash and Swelling 04/11/2014    History reviewed. No pertinent family history.  Social History   Social History  . Marital Status: Widowed    Spouse Name: N/A  . Number of Children: N/A  . Years of Education: N/A   Occupational History  . Not on file.   Social History Main Topics  . Smoking status: Never Smoker   . Smokeless tobacco: Never Used  . Alcohol Use: No  . Drug Use: No  . Sexual Activity: Not on file   Other Topics Concern  . Not on file   Social History Narrative    Review of Systems: See HPI, otherwise negative ROS  Physical Exam: BP 165/89 mmHg  Pulse 85  Temp(Src) 97.8 F (36.6 C) (Tympanic)  Resp 16  Ht 5\' 2"  (1.575 m)  Wt 112.038 kg (247 lb)  BMI 45.17 kg/m2  SpO2 100% General:   Alert,  pleasant and cooperative in NAD Head:  Normocephalic and atraumatic. Neck:  Supple; no masses or thyromegaly. Lungs:  Clear  throughout to auscultation.    Heart:  Regular rate and rhythm. Abdomen:  Soft, nontender and nondistended. Normal bowel sounds, without guarding, and without rebound.   Neurologic:  Alert and  oriented x4;  grossly normal neurologically.  Impression/Plan: Felicia Kidd is here for an colonoscopy to be performed for SCREENING COLONOSCOPY  Risks, benefits, limitations, and alternatives regarding  colonoscopy have been reviewed with the patient.  Questions have been answered.  All parties agreeable.   Gaylyn Cheers, MD  02/23/2015, 7:59 AM

## 2015-02-23 NOTE — Op Note (Signed)
[facility] Gastroenterology Patient Name: Felicia Kidd Procedure Date: 02/23/2015 8:00 AM MRN: [id number] Account #: [id number] Date of Birth: [birth year] Admit Type: Outpatient Age: 54 Room: [facility] ENDO ROOM 1 Gender: Female Note Status: Finalized Procedure:         Colonoscopy Indications:       Screening for colorectal malignant neoplasm Providers:         Manya Silvas, MD Referring MD:      Glendon Axe (Referring MD) Medicines:         Propofol per Anesthesia Complications:     No immediate complications. Procedure:         Pre-Anesthesia Assessment:                    - After reviewing the risks and benefits, the patient was                     deemed in satisfactory condition to undergo the procedure.                    After obtaining informed consent, the colonoscope was                     passed under direct vision. Throughout the procedure, the                     patient's blood pressure, pulse, and oxygen saturations                     were monitored continuously. The Colonoscope was                     introduced through the anus and advanced to the the cecum,                     identified by appendiceal orifice and ileocecal valve. The                     colonoscopy was performed without difficulty. The patient                     tolerated the procedure well. The quality of the bowel                     preparation was good. Findings:      Many medium-mouthed diverticula were found in the sigmoid colon.      Internal hemorrhoids were found during endoscopy. The hemorrhoids were       small and Grade I (internal hemorrhoids that do not prolapse).      The exam was otherwise without abnormality. Impression:        - Diverticulosis in the sigmoid colon.                    - Internal hemorrhoids.                    - The examination was otherwise normal.                    - No specimens collected. Recommendation:    - Repeat colonoscopy  in 10 years for screening purposes. Manya Silvas, MD 02/23/2015 8:37:44 AM This report has been signed electronically. Number of Addenda: 0 Note Initiated On: 02/23/2015 8:00 AM Scope Withdrawal Time: 0 hours 8 minutes  37 seconds  Total Procedure Duration: 0 hours 17 minutes 6 seconds       [facility]

## 2015-02-23 NOTE — Anesthesia Postprocedure Evaluation (Signed)
Anesthesia Post Note  Patient: Felicia Kidd  Procedure(s) Performed: Procedure(s) (LRB): COLONOSCOPY (N/A)  Patient location during evaluation: PACU Anesthesia Type: General Level of consciousness: awake Pain management: satisfactory to patient Vital Signs Assessment: post-procedure vital signs reviewed and stable Cardiovascular status: stable Anesthetic complications: no    Last Vitals:  Filed Vitals:   02/23/15 0849 02/23/15 0859  BP: 122/73 138/81  Pulse: 82 78  Temp:    Resp: 24 14    Last Pain: There were no vitals filed for this visit.               VAN STAVEREN,Eleesha Purkey

## 2015-02-23 NOTE — Anesthesia Preprocedure Evaluation (Signed)
Anesthesia Evaluation  Patient identified by MRN, date of birth, ID band Patient awake    Reviewed: Allergy & Precautions, NPO status , Patient's Chart, lab work & pertinent test results  Airway Mallampati: II       Dental  (+) Teeth Intact   Pulmonary neg pulmonary ROS,    breath sounds clear to auscultation       Cardiovascular Exercise Tolerance: Good hypertension, Pt. on medications  Rhythm:Regular     Neuro/Psych negative neurological ROS     GI/Hepatic Neg liver ROS, PUD,   Endo/Other  diabetes, Well Controlled, Type 2, Oral Hypoglycemic AgentsMorbid obesity  Renal/GU negative Renal ROS     Musculoskeletal negative musculoskeletal ROS (+)   Abdominal (+) + obese,   Peds  Hematology negative hematology ROS (+)   Anesthesia Other Findings   Reproductive/Obstetrics                             Anesthesia Physical Anesthesia Plan  ASA: II  Anesthesia Plan: General   Post-op Pain Management:    Induction: Intravenous  Airway Management Planned: Nasal Cannula  Additional Equipment:   Intra-op Plan:   Post-operative Plan:   Informed Consent: I have reviewed the patients History and Physical, chart, labs and discussed the procedure including the risks, benefits and alternatives for the proposed anesthesia with the patient or authorized representative who has indicated his/her understanding and acceptance.     Plan Discussed with: CRNA  Anesthesia Plan Comments:         Anesthesia Quick Evaluation

## 2015-02-25 ENCOUNTER — Encounter: Payer: Self-pay | Admitting: Unknown Physician Specialty

## 2015-04-07 ENCOUNTER — Encounter: Payer: Self-pay | Admitting: General Surgery

## 2015-04-09 IMAGING — NM NUCLEAR MEDICINE CARDIAC MULTIPLE UPTAKE GATED ACQUISITION SCAN
4 series · 24 of 24 positions shown · non-contrast
Comparison: None.

CLINICAL DATA: High risk chemotherapy.  Evaluate cardiac function.

EXAM:
NUCLEAR MEDICINE CARDIAC BLOOD POOL IMAGING (MUGA)
TECHNIQUE: Cardiac multi-gated acquisition was performed at rest following
intravenous injection of Bc-00m labeled red blood cells.
RADIOPHARMACEUTICALS:  22.7 8Gi0c-33m in-vitro labeled red blood
cells.

[Series 1000: lao 70-gated · 3.30mm/px · 6 of 24 frames shown]
[frame 3/24]
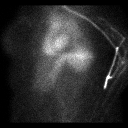
[frame 7/24]
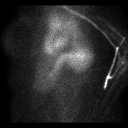
[frame 11/24]
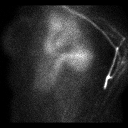
[frame 15/24]
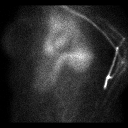
[frame 19/24]
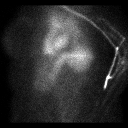
[frame 23/24]
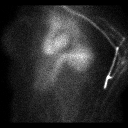

[Series 1000: lao 45-gated (results) · 3.30mm/px · 6 of 24 frames shown]
[frame 3/24]
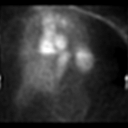
[frame 7/24]
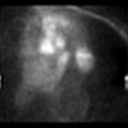
[frame 11/24]
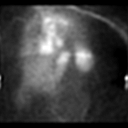
[frame 15/24]
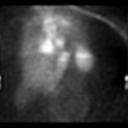
[frame 19/24]
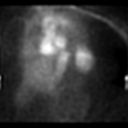
[frame 23/24]
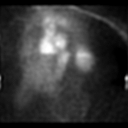

[Series 1000: lao 45-gated · 3.30mm/px · 6 of 24 frames shown]
[frame 3/24]
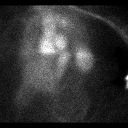
[frame 7/24]
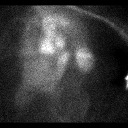
[frame 11/24]
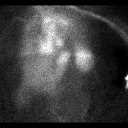
[frame 15/24]
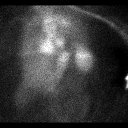
[frame 19/24]
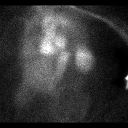
[frame 23/24]
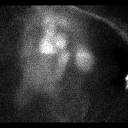

[Series 1000: ant-gated · 3.30mm/px · 6 of 24 frames shown]
[frame 3/24]
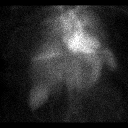
[frame 7/24]
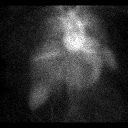
[frame 11/24]
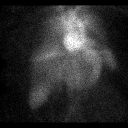
[frame 15/24]
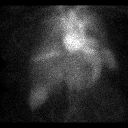
[frame 19/24]
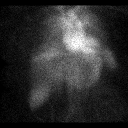
[frame 23/24]
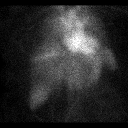

[24 of 24 positions shown; findings below may reference images not displayed]

FINDINGS: No wall motion abnormality of the left ventricle.

Calculated left ventricular ejection fraction equals 66%
IMPRESSION: Left ventricular ejection fraction 66%

## 2015-04-11 ENCOUNTER — Ambulatory Visit: Payer: 59 | Admitting: General Surgery

## 2015-04-13 ENCOUNTER — Encounter: Payer: Self-pay | Admitting: General Surgery

## 2015-04-13 ENCOUNTER — Ambulatory Visit (INDEPENDENT_AMBULATORY_CARE_PROVIDER_SITE_OTHER): Payer: BLUE CROSS/BLUE SHIELD | Admitting: General Surgery

## 2015-04-13 VITALS — BP 142/80 | HR 76 | Resp 12 | Ht 60.0 in | Wt 249.0 lb

## 2015-04-13 DIAGNOSIS — C50311 Malignant neoplasm of lower-inner quadrant of right female breast: Secondary | ICD-10-CM

## 2015-04-13 NOTE — Progress Notes (Signed)
Patient ID: Felicia Kidd, female   DOB: 11-15-1960, 55 y.o.   MRN: [id number]  Chief Complaint  Patient presents with  . Follow-up    mammogram    HPI Felicia Kidd is a 55 y.o. female who presents for a breast evaluation. The most recent right breast mammogram was done on 03/31/15.  Patient does perform regular self breast checks and gets regular mammograms done.   The patient did get her colonoscopy completed last year under the care of Dr. Vira Agar.  I personally reviewed the patient's history.     HPI  Past Medical History  Diagnosis Date  . Diabetes mellitus without complication ([place])   . Hyperlipidemia   . Hypertension   . Thyroid disease   . Breast cancer of lower-inner quadrant of right female breast [facility]) June 2015    Triple negative, T1c, N2a.Treated with Cytoxan and Adriamycin in a dose dense fashion followed by carbotaxol.    . Breast cancer [facility])     Past Surgical History  Procedure Laterality Date  . Dilation and curettage of uterus      Dr Vernie Ammons  . Portacath placement  11-02-13  . Breast surgery Right 09-27-13    Wide excision of Right breast lesion with attempted sentinel node biopsy followed by axillary dissection  . Colonoscopy N/A 02/23/2015    Procedure: COLONOSCOPY;  Surgeon: Manya Silvas, MD;  Location: [facility] ENDOSCOPY;  Service: Endoscopy;  Laterality: N/A;   Needs early AM - IDDM    No family history on file.  Social History Social History  Substance Use Topics  . Smoking status: Never Smoker   . Smokeless tobacco: Never Used  . Alcohol Use: No    Allergies  Allergen Reactions  . Other Rash and Swelling    PNEUMOVAX.    Current Outpatient Prescriptions  Medication Sig Dispense Refill  . acetaminophen (TYLENOL) 325 MG tablet Take 650 mg by mouth every 6 (six) hours as needed.    Marland Kitchen atorvastatin (LIPITOR) 40 MG tablet Take 40 mg by mouth daily.    . fluticasone (FLONASE) 50 MCG/ACT nasal spray Place 2 sprays into both  nostrils daily.    Marland Kitchen gabapentin (NEURONTIN) 100 MG capsule Take by mouth.    . Halcinonide (HALOG) 0.1 % CREA Apply 1 Tube topically 2 (two) times daily.    Marland Kitchen loratadine (CLARITIN) 10 MG tablet Take 1 tablet by mouth daily.    . metFORMIN (GLUCOPHAGE) 500 MG tablet Take 1 tablet by mouth daily at 6 (six) AM.    . omeprazole (PRILOSEC) 20 MG capsule Take 20 mg by mouth daily.    . polyethylene glycol powder (GLYCOLAX/MIRALAX) powder Take as directed for colonoscopy prep.    Marland Kitchen telmisartan-hydrochlorothiazide (MICARDIS HCT) 40-12.5 MG per tablet Take 1 tablet by mouth daily.  11  . traMADol (ULTRAM) 50 MG tablet TK 1 T PO NIGHTLY PRN FOR UP TO 5 DAYS  0  . valACYclovir (VALTREX) 1000 MG tablet Take by mouth.     No current facility-administered medications for this visit.   Facility-Administered Medications Ordered in Other Visits  Medication Dose Route Frequency Provider Last Rate Last Dose  . heparin lock flush 100 unit/mL  500 Units Intravenous Once Forest Gleason, MD      . sodium chloride 0.9 % injection 10 mL  10 mL Intravenous PRN Forest Gleason, MD      . sodium chloride 0.9 % injection 10 mL  10 mL Intravenous PRN Forest Gleason, MD  10 mL at 10/26/14 0940    Review of Systems Review of Systems  Constitutional: Negative.   Respiratory: Negative.   Cardiovascular: Negative.     Blood pressure 142/80, pulse 76, resp. rate 12, height 5' (1.524 m), weight 249 lb (112.946 kg).  Physical Exam Physical Exam  Constitutional: She is oriented to person, place, and time. She appears well-developed.  Cardiovascular: Normal rate, regular rhythm and normal heart sounds.   Pulmonary/Chest: Effort normal and breath sounds normal. Right breast exhibits no inverted nipple, no mass, no nipple discharge, no skin change and no tenderness. Left breast exhibits no inverted nipple, no mass, no nipple discharge, no skin change and no tenderness.    Left side well healed incision where port was  removed. Right breast incision at 6 o'clock. Moderate ridgelike thickening below the incision with gentle nodularity. Consistent with fat necrosis.  Mild faint erythema veins status post whole breast radiation.  Neurological: She is alert and oriented to person, place, and time.  Skin: Skin is warm and dry.    Data Reviewed Right breast mammogram dated 03/31/2015 completed at [facility] twice a day shows calcifications at the wide excision site. No evidence of malignancy. BI-RADS-2.  Assessment    Doing well now 18 months status post right breast conservation.    Plan       Patient to return in 6 months bilateral diagnotic mammogram. PCP:  Candiss Norse, This information has been scribed by Gaspar Cola CMA.    Felicia Kidd 04/14/2015, 6:35 AM

## 2015-04-13 NOTE — Patient Instructions (Signed)
Patient to return in 6 months bilateral diagnotic mammogram 

## 2015-05-23 ENCOUNTER — Ambulatory Visit: Payer: BLUE CROSS/BLUE SHIELD | Attending: Orthopedic Surgery

## 2015-05-23 DIAGNOSIS — M25561 Pain in right knee: Secondary | ICD-10-CM | POA: Diagnosis present

## 2015-05-23 DIAGNOSIS — M25562 Pain in left knee: Secondary | ICD-10-CM | POA: Diagnosis present

## 2015-05-23 DIAGNOSIS — R531 Weakness: Secondary | ICD-10-CM | POA: Diagnosis present

## 2015-05-23 DIAGNOSIS — M170 Bilateral primary osteoarthritis of knee: Secondary | ICD-10-CM | POA: Diagnosis not present

## 2015-05-23 NOTE — Therapy (Signed)
[facility] [address] [place], [address] Phone: [phone]   Fax:  [phone]  Physical Therapy Evaluation  Patient Details  Name: Felicia Kidd MRN: [id number] Date of Birth: 06-21-1960 Referring Provider: Thornton Park, MD  Encounter Date: 05/23/2015      PT End of Session - 05/23/15 1716    Visit Number 1   Number of Visits 17   Date for PT Re-Evaluation 07/20/15   PT Start Time [id number]   PT Stop Time 1836   PT Time Calculation (min) 80 min   Activity Tolerance Patient tolerated treatment well   Behavior During Therapy [facility] for tasks assessed/performed      Past Medical History  Diagnosis Date  . Diabetes mellitus without complication ([place])   . Hyperlipidemia   . Hypertension   . Thyroid disease   . Breast cancer of lower-inner quadrant of right female breast [facility]) June 2015    Triple negative, T1c, N2a.Treated with Cytoxan and Adriamycin in a dose dense fashion followed by carbotaxol.    . Breast cancer ([place])   . Bronchitis   . Allergy   . Asthma     Past Surgical History  Procedure Laterality Date  . Dilation and curettage of uterus      Dr Vernie Ammons  . Portacath placement  11-02-13  . Breast surgery Right 09-27-13    Wide excision of Right breast lesion with attempted sentinel node biopsy followed by axillary dissection  . Colonoscopy N/A 02/23/2015    Procedure: COLONOSCOPY;  Surgeon: Manya Silvas, MD;  Location: [facility] ENDOSCOPY;  Service: Endoscopy;  Laterality: N/A;   Needs early AM - IDDM    There were no vitals filed for this visit.  Visit Diagnosis:  Primary osteoarthritis of both knees - Plan: PT plan of care cert/re-cert  Arthralgia of both knees - Plan: PT plan of care cert/re-cert  Weakness - Plan: PT plan of care cert/re-cert      Subjective Assessment - 05/23/15 1721    Subjective L knee pain: 0/10 currently (pt sitting), 10/10 at worst (when on her feet  taking care of her grandchildren). R knee pain: 0/10 (pt sitting) currently, 10/10 at worst (when standing and taking care of grandchildren)   Pertinent History Bilateral knee pain.  Finished last chemotherapy for breast cancer January 2016. Following chemo and radiation, pt knees started bothering her. Feels like a tendon in the back of her L knee bothered her. Had x-rays which revealed thinning of cartilage of both knees. L knee bothers her more than the R. Weather bothers her knees.  Pt also feels pain in her L lower glute.  Pt also takes care of  her grandchildren (57 year old, and 57 months old). L glute pain began about a week ago, pt thinks she might have aggravated it by bending over (aggravating factors include bending over and picking up a 23 lb baby).  Has hx of L breast cancer, S/P lumpectomy. Per pt she had 2 mamograms which revealed no cancer afterwards.    Patient Stated Goals Learn how to perform her body better so that she does not damage it more.    Currently in Pain? No/denies   Pain Score 0-No pain   Pain Location Knee   Pain Orientation Left;Right   Pain Descriptors / Indicators Aching  tender   Aggravating Factors  standing and walking, taking care of granchildren, weather, stair negotiation   Pain Relieving Factors Meloxicam,  water aerobics (pt currently a water aerobics instructor)   Multiple Pain Sites Yes  bilateral knees, L glute            [facility] PT Assessment - 05/23/15 1736    Assessment   Medical Diagnosis bilateral primary osteoarthritis of knee   Referring Provider Thornton Park, MD   Onset Date/Surgical Date 04/26/15  Date physical therapy order signed   Prior Therapy None   Precautions   Precaution Comments No known precautions   Restrictions   Other Position/Activity Restrictions No known restrictions   Balance Screen   Has the patient fallen in the past 6 months No   Has the patient had a decrease in activity level because of a fear of falling?   No   Is the patient reluctant to leave their home because of a fear of falling?  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; Midwife PLOF: no problems walking, taking care of grandchildren, stair negotiation   Observation/Other Assessments   Observations L knee valgus when descending stairs, bilateral rail assist    Lower Extremity Functional Scale  41/80   Posture/Postural Control   Posture Comments bilateral foot pronation, R hip in ER, bilateral knee recurvatum, R weight shifting, bilaterally protracted shoulder   AROM   Right Knee Extension --  full   Left Knee Extension --  full   Strength   Right Hip Flexion 4/5   Right Hip Extension 4-/5  with R hamstring cramps   Right Hip External Rotation  4/5   Right Hip Internal Rotation 4/5   Right Hip ABduction 4/5  R clam shell 4+/5   Left Hip Flexion 4/5   Left Hip Extension 4/5   Left Hip External Rotation 4-/5  with L glute pain   Left Hip Internal Rotation 4/5   Left Hip ABduction 4/5  4+/5 L clam shell   Right Knee Flexion 4+/5   Right Knee Extension 5/5   Left Knee Flexion 4/5  with L glute pain. No pain with eccentric contraction   Left Knee Extension 5/5   Palpation   Palpation comment TTP L medial hamstings, proximal insertion with reproduction of symptoms. Decreased medial glide R patella. Good L patella mobility. No knee TTP bilaterally.    Ambulation/Gait   Gait Comments L lateral lean, slight antalgic R LE, bilateral foot pronation during foot flat        Objectives  There-ex  Directed patient with prone glute max/quad set 10x2 with 5 second holds  Reviewed and given as part of her HEP. Pt demonstrated and verbalized understanding. S/L hip abduction 10x2 each LE  Improved exercise technique, movement at target joints, use of target muscles after mod verbal, visual, tactile cues.     Pt tolerated session well without aggravation of  bilateral knee or L posterior hip symptoms.                    PT Education - 05/23/15 1904    Education provided Yes   Education Details ther-ex, HEP, plan of care   Person(s) Educated Patient   Methods Explanation;Demonstration;Tactile cues;Verbal cues;Handout   Comprehension Returned demonstration;Verbalized understanding             PT Long Term Goals - 05/23/15 1857    PT LONG TERM GOAL #1   Title Patient will have a decrease in bilateral knee pain to 4/10 or less at worst  to promote ability to stand and take care of her grandchildren.    Baseline 10/10 bilateral knee pain at worst.   Time 8   Period Weeks   Status New   PT LONG TERM GOAL #2   Title Patient will improve her LEFS score by at least 9 points as a demonstration of improved function.    Baseline 41/80   Time 8   Period Weeks   Status New   PT LONG TERM GOAL #3   Title Patient will improve bilateral hip strength by at least 1/2 MMT grade to promote ability to perform standing tasks with less knee pain.    Time 8   Period Weeks   Status New               Plan - 05/23/15 1837    Clinical Impression Statement Pt is a 55 year old female who came to physical therapy secondary to bilateral knee pain and recent onset of L posterior hip pain. She also presents with bilateral hip weakness, bilateral foot pronation, bilateral genu recurvatum, altered gait pattern, decreased femoral control with stair negotiation, and difficulty performing functional tasks such as lifting her grandson, negotiating stairs, and walking. Patient will benefit from skilled physical therapy services to address the aforementioned deficits.    Pt will benefit from skilled therapeutic intervention in order to improve on the following deficits Pain;Decreased strength;Abnormal gait;Improper body mechanics;Postural dysfunction;Difficulty walking   Rehab Potential Good   Clinical Impairments Affecting Rehab Potential no known  clinical impairements affecting rehab potential   PT Frequency 2x / week   PT Duration 8 weeks   PT Treatment/Interventions Therapeutic activities;Therapeutic exercise;Manual techniques;Aquatic Therapy;Gait training;Electrical Stimulation;Iontophoresis 4mg /ml Dexamethasone;Ultrasound;Patient/family education;Neuromuscular re-education   PT Next Visit Plan hip strengthening, femoral control   Consulted and Agree with Plan of Care Patient         Problem List Patient Active Problem List   Diagnosis Date Noted  . Benign essential HTN 08/03/2014  . Diabetes mellitus, type 2 ([place]) 08/03/2014  . Combined fat and carbohydrate induced hyperlipemia 08/03/2014  . Gastro-esophageal reflux disease without esophagitis 08/03/2014  . Cellulitis of other specified site 11/02/2013  . Breast cancer of lower-inner quadrant of right female breast ([place]) 08/09/2013   Thank you for your referral.  [name] PT, DPT   05/23/2015, 7:38 PM  [facility] [address] [place], [address] Phone: [phone]   Fax:  [phone]  Name: FAYETTE MONZINGO MRN: [id number] Date of Birth: 03-25-1960

## 2015-05-23 NOTE — Patient Instructions (Signed)
Quadriceps Set (Prone)    With toes supporting lower legs, tighten glute and thigh muscles to straighten knees. Hold _5___ seconds. Relax. Repeat __10__ times per set. Do __3__ sets per session. Do _1___ sessions per day.  http://orth.exer.us/727   Copyright  VHI. All rights reserved.

## 2015-05-25 ENCOUNTER — Ambulatory Visit: Payer: BLUE CROSS/BLUE SHIELD

## 2015-05-25 DIAGNOSIS — M170 Bilateral primary osteoarthritis of knee: Secondary | ICD-10-CM

## 2015-05-25 DIAGNOSIS — R531 Weakness: Secondary | ICD-10-CM

## 2015-05-25 DIAGNOSIS — M25562 Pain in left knee: Secondary | ICD-10-CM

## 2015-05-25 DIAGNOSIS — M25561 Pain in right knee: Secondary | ICD-10-CM

## 2015-05-25 NOTE — Therapy (Signed)
[place] [facility] [address] [place], [address] Phone: [phone]   Fax:  [phone]  Physical Therapy Treatment  Patient Details  Name: Felicia Kidd MRN: [id number] Date of Birth: 08-Jan-1961 Referring Provider: Thornton Park, MD  Encounter Date: 05/25/2015      PT End of Session - 05/25/15 1735    Visit Number 2   Number of Visits 17   Date for PT Re-Evaluation 07/20/15   PT Start Time [id number]   PT Stop Time 1822   PT Time Calculation (min) 47 min   Activity Tolerance Patient tolerated treatment well   Behavior During Therapy [facility] for tasks assessed/performed      Past Medical History  Diagnosis Date  . Diabetes mellitus without complication ([place])   . Hyperlipidemia   . Hypertension   . Thyroid disease   . Breast cancer of lower-inner quadrant of right female breast [facility]) June 2015    Triple negative, T1c, N2a.Treated with Cytoxan and Adriamycin in a dose dense fashion followed by carbotaxol.    . Breast cancer ([place])   . Bronchitis   . Allergy   . Asthma     Past Surgical History  Procedure Laterality Date  . Dilation and curettage of uterus      Dr Vernie Ammons  . Portacath placement  11-02-13  . Breast surgery Right 09-27-13    Wide excision of Right breast lesion with attempted sentinel node biopsy followed by axillary dissection  . Colonoscopy N/A 02/23/2015    Procedure: COLONOSCOPY;  Surgeon: Manya Silvas, MD;  Location: [facility] ENDOSCOPY;  Service: Endoscopy;  Laterality: N/A;   Needs early AM - IDDM    There were no vitals filed for this visit.  Visit Diagnosis:  Primary osteoarthritis of both knees  Arthralgia of both knees  Weakness      Subjective Assessment - 05/25/15 1735    Subjective Knees seem to better than they were before. Hamstring feels a little better. No pain or discomfort bilateral knees currently.    Pertinent History Bilateral knee pain.  Finished last  chemotherapy for breast cancer January 2016. Following chemo and radiation, pt knees started bothering her. Feels like a tendon in the back of her L knee bothered her. Had x-rays which revealed thinning of cartilage of both knees. L knee bothers her more than the R. Weather bothers her knees.  Pt also feels pain in her L lower glute.  Pt also takes care of  her grandchildren (107.52 year old, and 9 months old). L glute pain began about a week ago, pt thinks she might have aggravated it by bending over (aggravating factors include bending over and picking up a 23 lb baby).  Has hx of L breast cancer, S/P lumpectomy. Per pt she had 2 mamograms which revealed no cancer afterwards.    Patient Stated Goals Learn how to perform her body better so that she does not damage it more.    Currently in Pain? No/denies   Pain Score 0-No pain   Multiple Pain Sites No           Objectives  There-ex  Directed patient with supine bridge 10x3  S/L hip abduction 10x3 each LE  Side stepping 32 ft to the R and to the L holding 5 lbs each hand 3x  Forward wedding march 32 ft x 4 holding onto 5 lb weights   Standing mini squats with bilateral UE assist 10x2  Standing  ankle DF/PF on wobble board 2 min  Static backward mini lunge with L LE 10x, then 7x (slight L knee discomfort)     Forward walk resisting single black theratube 5x  SLS with opposite toe taps onto treadmill for glute med use 10x2 each LE with bilateral UE assist.    Improved exercise technique, movement at target joints, use of target muscles after mod verbal, visual, tactile cues.      Pt tolerated session well without aggravation of symptoms. Some L knee discomfort with static backward mini lunge and some R knee discomfort with SLS and opposite toe tap on treadmill which decreased with rest. Pt also demonstrates lateral lean during gait at ipsilateral stance LE R > L.                   PT Education - 05/25/15 1752     Education provided Yes   Education Details ther-ex   [employer]) Educated Patient   Methods Explanation;Demonstration;Tactile cues;Verbal cues   Comprehension Verbalized understanding;Returned demonstration             PT Long Term Goals - 05/23/15 1857    PT LONG TERM GOAL #1   Title Patient will have a decrease in bilateral knee pain to 4/10 or less at worst to promote ability to stand and take care of her grandchildren.    Baseline 10/10 bilateral knee pain at worst.   Time 8   Period Weeks   Status New   PT LONG TERM GOAL #2   Title Patient will improve her LEFS score by at least 9 points as a demonstration of improved function.    Baseline 41/80   Time 8   Period Weeks   Status New   PT LONG TERM GOAL #3   Title Patient will improve bilateral hip strength by at least 1/2 MMT grade to promote ability to perform standing tasks with less knee pain.    Time 8   Period Weeks   Status New               Plan - 05/25/15 1753    Clinical Impression Statement Pt tolerated session well without aggravation of symptoms. Some L knee discomfort with static backward mini lunge and some R knee discomfort with SLS and opposite toe tap on treadmill which decreased with rest. Pt also demonstrates lateral lean during gait at ipsilateral stance LE R > L.    Pt will benefit from skilled therapeutic intervention in order to improve on the following deficits Pain;Decreased strength;Abnormal gait;Improper body mechanics;Postural dysfunction;Difficulty walking   Rehab Potential Good   Clinical Impairments Affecting Rehab Potential no known clinical impairements affecting rehab potential   PT Frequency 2x / week   PT Duration 8 weeks   PT Treatment/Interventions Therapeutic activities;Therapeutic exercise;Manual techniques;Aquatic Therapy;Gait training;Electrical Stimulation;Iontophoresis 4mg /ml Dexamethasone;Ultrasound;Patient/family education;Neuromuscular re-education   PT Next Visit Plan  hip strengthening, femoral control   Consulted and Agree with Plan of Care Patient        Problem List Patient Active Problem List   Diagnosis Date Noted  . Benign essential HTN 08/03/2014  . Diabetes mellitus, type 2 ([place]) 08/03/2014  . Combined fat and carbohydrate induced hyperlipemia 08/03/2014  . Gastro-esophageal reflux disease without esophagitis 08/03/2014  . Cellulitis of other specified site 11/02/2013  . Breast cancer of lower-inner quadrant of right female breast [facility]) 08/09/2013    [name] PT, DPT   05/25/2015, 6:29 PM  [place] [facility] [address] [place], [address] Phone: [phone]   Fax:  [phone]  Name: PYPER DAGES MRN: [id number] Date of Birth: 03/30/1960

## 2015-05-29 ENCOUNTER — Ambulatory Visit: Payer: BLUE CROSS/BLUE SHIELD

## 2015-05-29 DIAGNOSIS — M25562 Pain in left knee: Secondary | ICD-10-CM

## 2015-05-29 DIAGNOSIS — M170 Bilateral primary osteoarthritis of knee: Secondary | ICD-10-CM

## 2015-05-29 DIAGNOSIS — R531 Weakness: Secondary | ICD-10-CM

## 2015-05-29 DIAGNOSIS — M25561 Pain in right knee: Secondary | ICD-10-CM

## 2015-05-29 NOTE — Therapy (Signed)
[place] [facility] [address], [place], [address] Phone: [phone]   Fax:  [phone]  Physical Therapy Treatment  Patient Details  Name: Felicia Kidd MRN: [id number] Date of Birth: [birth year] Referring Provider: Thornton Park, MD  Encounter Date: 05/29/2015      PT End of Session - 05/29/15 1751    Visit Number 3   Number of Visits 17   Date for PT Re-Evaluation 07/20/15   PT Start Time [id number]   PT Stop Time 1835   PT Time Calculation (min) 44 min   Activity Tolerance Patient tolerated treatment well   Behavior During Therapy [facility] for tasks assessed/performed      Past Medical History  Diagnosis Date  . Diabetes mellitus without complication ([place])   . Hyperlipidemia   . Hypertension   . Thyroid disease   . Breast cancer of lower-inner quadrant of right female breast [facility]) June 2015    Triple negative, T1c, N2a.Treated with Cytoxan and Adriamycin in a dose dense fashion followed by carbotaxol.    . Breast cancer ([place])   . Bronchitis   . Allergy   . Asthma     Past Surgical History  Procedure Laterality Date  . Dilation and curettage of uterus      Dr Vernie Ammons  . Portacath placement  11-02-13  . Breast surgery Right 09-27-13    Wide excision of Right breast lesion with attempted sentinel node biopsy followed by axillary dissection  . Colonoscopy N/A 02/23/2015    Procedure: COLONOSCOPY;  Surgeon: Manya Silvas, MD;  Location: [facility] ENDOSCOPY;  Service: Endoscopy;  Laterality: N/A;   Needs early AM - IDDM    There were no vitals filed for this visit.  Visit Diagnosis:  Primary osteoarthritis of both knees  Arthralgia of both knees  Weakness      Subjective Assessment - 05/29/15 1753    Subjective Knees feel good. Last Thursday pt bent over to pick something up from the ground, lost her balance and hurt her L posterior hip.   Pertinent History Bilateral knee pain.  Finished last  chemotherapy for breast cancer January 2016. Following chemo and radiation, pt knees started bothering her. Feels like a tendon in the back of her L knee bothered her. Had x-rays which revealed thinning of cartilage of both knees. L knee bothers her more than the R. Weather bothers her knees.  Pt also feels pain in her L lower glute.  Pt also takes care of  her grandchildren (56.22 year old, and 78 months old). L glute pain began about a week ago, pt thinks she might have aggravated it by bending over (aggravating factors include bending over and picking up a 23 lb baby).  Has hx of L breast cancer, S/P lumpectomy. Per pt she had 2 mamograms which revealed no cancer afterwards.    Patient Stated Goals Learn how to perform her body better so that she does not damage it more.    Currently in Pain? Yes   Pain Score 5   L posterior hip pain currently. No knee pain bilaterally current   Multiple Pain Sites No           Objectives  There-ex  Directed patient with prone glute max/quad set 10x10 second holds each LE  supine bridge 10x3 with 5 second holds   S/L hip abduction 10x3 each LE  Side stepping 32 ft to the R and to the L holding 5  lbs each hand 3x  Forward wedding march 32 ft x 4 holding onto 5 lb weights   Standing ankle DF/PF 10x3 each way with bilateral UE assist  SLS with opposite toe taps onto treadmill for glute med use 10x2 each LE with bilateral UE assist.  Forward walk resisting double black theratube 10x     Improved exercise technique, movement at target joints, use of target muscles after mod verbal, visual, tactile cues.       No L posterior hip pain after session. Pt tolerated session well without complain of bilateral knee pain. Pt states feeling good glute muscle use with exercises.                PT Education - 05/29/15 1758    Education provided Yes   Education Details ther-ex   [employer]) Educated Patient   Methods  Explanation;Demonstration;Tactile cues;Verbal cues   Comprehension Verbalized understanding;Returned demonstration             PT Long Term Goals - 05/23/15 1857    PT LONG TERM GOAL #1   Title Patient will have a decrease in bilateral knee pain to 4/10 or less at worst to promote ability to stand and take care of her grandchildren.    Baseline 10/10 bilateral knee pain at worst.   Time 8   Period Weeks   Status New   PT LONG TERM GOAL #2   Title Patient will improve her LEFS score by at least 9 points as a demonstration of improved function.    Baseline 41/80   Time 8   Period Weeks   Status New   PT LONG TERM GOAL #3   Title Patient will improve bilateral hip strength by at least 1/2 MMT grade to promote ability to perform standing tasks with less knee pain.    Time 8   Period Weeks   Status New               Plan - 05/29/15 1759    Clinical Impression Statement No L posterior hip pain after session. Pt tolerated session well without complain of bilateral knee pain. Pt states feeling good glute muscle use with exercises.    Pt will benefit from skilled therapeutic intervention in order to improve on the following deficits Pain;Decreased strength;Abnormal gait;Improper body mechanics;Postural dysfunction;Difficulty walking   Rehab Potential Good   Clinical Impairments Affecting Rehab Potential no known clinical impairements affecting rehab potential   PT Frequency 2x / week   PT Duration 8 weeks   PT Treatment/Interventions Therapeutic activities;Therapeutic exercise;Manual techniques;Aquatic Therapy;Gait training;Electrical Stimulation;Iontophoresis 4mg /ml Dexamethasone;Ultrasound;Patient/family education;Neuromuscular re-education   PT Next Visit Plan hip strengthening, femoral control   Consulted and Agree with Plan of Care Patient        Problem List Patient Active Problem List   Diagnosis Date Noted  . Benign essential HTN 08/03/2014  . Diabetes  mellitus, type 2 ([place]) 08/03/2014  . Combined fat and carbohydrate induced hyperlipemia 08/03/2014  . Gastro-esophageal reflux disease without esophagitis 08/03/2014  . Cellulitis of other specified site 11/02/2013  . Breast cancer of lower-inner quadrant of right female breast [facility]) 08/09/2013    [name] PT, DPT   05/29/2015, 6:47 PM  [place] [facility] [address], [place], [address] Phone: [phone]   Fax:  [phone]  Name: Felicia Kidd MRN: [id number] Date of Birth: 08-11-1960

## 2015-05-31 ENCOUNTER — Ambulatory Visit: Payer: BLUE CROSS/BLUE SHIELD

## 2015-05-31 VITALS — BP 141/87 | HR 84

## 2015-05-31 DIAGNOSIS — M25561 Pain in right knee: Secondary | ICD-10-CM

## 2015-05-31 DIAGNOSIS — M170 Bilateral primary osteoarthritis of knee: Secondary | ICD-10-CM | POA: Diagnosis not present

## 2015-05-31 DIAGNOSIS — M25562 Pain in left knee: Secondary | ICD-10-CM

## 2015-05-31 DIAGNOSIS — R531 Weakness: Secondary | ICD-10-CM

## 2015-05-31 NOTE — Therapy (Signed)
[place] [facility] [address] [place], [address] Phone: [phone]   Fax:  [phone]  Physical Therapy Treatment  Patient Details  Name: Felicia Kidd MRN: [id number] Date of Birth: 1960-06-16 Referring Provider: Thornton Park, MD  Encounter Date: 05/31/2015      PT End of Session - 05/31/15 [id number]    Visit Number 4   Number of Visits 17   Date for PT Re-Evaluation 07/20/15   PT Start Time [id number]   PT Stop Time 1834   PT Time Calculation (min) 45 min   Activity Tolerance Patient tolerated treatment well   Behavior During Therapy [facility] for tasks assessed/performed      Past Medical History  Diagnosis Date  . Diabetes mellitus without complication ([place])   . Hyperlipidemia   . Hypertension   . Thyroid disease   . Breast cancer of lower-inner quadrant of right female breast [facility]) June 2015    Triple negative, T1c, N2a.Treated with Cytoxan and Adriamycin in a dose dense fashion followed by carbotaxol.    . Breast cancer ([place])   . Bronchitis   . Allergy   . Asthma     Past Surgical History  Procedure Laterality Date  . Dilation and curettage of uterus      Dr Vernie Ammons  . Portacath placement  11-02-13  . Breast surgery Right 09-27-13    Wide excision of Right breast lesion with attempted sentinel node biopsy followed by axillary dissection  . Colonoscopy N/A 02/23/2015    Procedure: COLONOSCOPY;  Surgeon: Manya Silvas, MD;  Location: [facility] ENDOSCOPY;  Service: Endoscopy;  Laterality: N/A;   Needs early AM - IDDM    Filed Vitals:   05/31/15 1750  BP: 141/87  Pulse: 84    Visit Diagnosis:  Primary osteoarthritis of both knees  Arthralgia of both knees  Weakness      Subjective Assessment - 05/31/15 1750    Subjective Knees seem to be doing good. I'm real pleased. L posterior hip is 4/10 currently.  Felt a cramp R posterior hip to R ankle yesterday. Fine today for R LE.     Pertinent  History Bilateral knee pain.  Finished last chemotherapy for breast cancer January 2016. Following chemo and radiation, pt knees started bothering her. Feels like a tendon in the back of her L knee bothered her. Had x-rays which revealed thinning of cartilage of both knees. L knee bothers her more than the R. Weather bothers her knees.  Pt also feels pain in her L lower glute.  Pt also takes care of  her grandchildren (16.69 year old, and 40 months old). L glute pain began about a week ago, pt thinks she might have aggravated it by bending over (aggravating factors include bending over and picking up a 23 lb baby).  Has hx of L breast cancer, S/P lumpectomy. Per pt she had 2 mamograms which revealed no cancer afterwards.    Patient Stated Goals Learn how to perform her body better so that she does not damage it more.    Currently in Pain? Yes   Pain Score 4   L posterior hip.    Multiple Pain Sites No           Objectives  There-ex  Directed patient with prone L hip extension SLR 10x3 concentric contraction,   then eccentric contraction 10x2 L hip extension  S/L hip abduction 10x3 each LE  supine bridge 10x3 with 5  second holds   SLS with bilateral UE assist 10x 5 second holds alternating LE for 2 sets  Forward wedding march 32 ft x 4 holding onto 6 lb weights   Side stepping 32 ft to the R and to the L holding 6 lbs each hand 3x     Improved exercise technique, movement at target joints, use of target muscles after min to mod verbal, visual, tactile cues.                  PT Education - 05/31/15 1755    Education provided Yes   Education Details ther-ex   [employer]) Educated Patient   Methods Explanation;Demonstration;Tactile cues;Verbal cues   Comprehension Verbalized understanding;Returned demonstration             PT Long Term Goals - 05/23/15 1857    PT LONG TERM GOAL #1   Title Patient will have a decrease in bilateral knee pain to 4/10 or less at  worst to promote ability to stand and take care of her grandchildren.    Baseline 10/10 bilateral knee pain at worst.   Time 8   Period Weeks   Status New   PT LONG TERM GOAL #2   Title Patient will improve her LEFS score by at least 9 points as a demonstration of improved function.    Baseline 41/80   Time 8   Period Weeks   Status New   PT LONG TERM GOAL #3   Title Patient will improve bilateral hip strength by at least 1/2 MMT grade to promote ability to perform standing tasks with less knee pain.    Time 8   Period Weeks   Status New               Plan - 05/31/15 1756    Clinical Impression Statement No knee pain throughout session bilaterally. No L posterior hip pain after session. Patient making good progress towards goals.    Pt will benefit from skilled therapeutic intervention in order to improve on the following deficits Pain;Decreased strength;Abnormal gait;Improper body mechanics;Postural dysfunction;Difficulty walking   Rehab Potential Good   Clinical Impairments Affecting Rehab Potential no known clinical impairements affecting rehab potential   PT Frequency 2x / week   PT Duration 8 weeks   PT Treatment/Interventions Therapeutic activities;Therapeutic exercise;Manual techniques;Aquatic Therapy;Gait training;Electrical Stimulation;Iontophoresis 4mg /ml Dexamethasone;Ultrasound;Patient/family education;Neuromuscular re-education   PT Next Visit Plan hip strengthening, femoral control   Consulted and Agree with Plan of Care Patient        Problem List Patient Active Problem List   Diagnosis Date Noted  . Benign essential HTN 08/03/2014  . Diabetes mellitus, type 2 ([place]) 08/03/2014  . Combined fat and carbohydrate induced hyperlipemia 08/03/2014  . Gastro-esophageal reflux disease without esophagitis 08/03/2014  . Cellulitis of other specified site 11/02/2013  . Breast cancer of lower-inner quadrant of right female breast [facility]) 08/09/2013    [name]  PT, DPT   05/31/2015, 6:37 PM  [place] [facility] [address] [place], [address] Phone: [phone]   Fax:  [phone]  Name: Felicia Kidd MRN: [id number] Date of Birth: 1961/01/25

## 2015-06-06 ENCOUNTER — Ambulatory Visit: Payer: BLUE CROSS/BLUE SHIELD

## 2015-06-06 DIAGNOSIS — M170 Bilateral primary osteoarthritis of knee: Secondary | ICD-10-CM

## 2015-06-06 DIAGNOSIS — M25562 Pain in left knee: Secondary | ICD-10-CM

## 2015-06-06 DIAGNOSIS — M25561 Pain in right knee: Secondary | ICD-10-CM

## 2015-06-06 DIAGNOSIS — R531 Weakness: Secondary | ICD-10-CM

## 2015-06-06 NOTE — Patient Instructions (Signed)
Forward wedding march holding onto 6 lbs weight each hand given as part of her HEP. Pt demonstrated and verbalized understanding.

## 2015-06-06 NOTE — Therapy (Signed)
[facility] [address] [place], [address] Phone: [phone]   Fax:  [phone]  Physical Therapy Treatment  Patient Details  Name: Felicia Kidd MRN: [id number] Date of Birth: Jul 29, 1960 Referring Provider: Thornton Park, MD  Encounter Date: 06/06/2015      PT End of Session - 06/06/15 1736    Visit Number 5   Number of Visits 17   Date for PT Re-Evaluation 07/20/15   PT Start Time [id number]   PT Stop Time 1826   PT Time Calculation (min) 49 min   Activity Tolerance Patient tolerated treatment well   Behavior During Therapy [facility] for tasks assessed/performed      Past Medical History  Diagnosis Date  . Diabetes mellitus without complication ([place])   . Hyperlipidemia   . Hypertension   . Thyroid disease   . Breast cancer of lower-inner quadrant of right female breast [facility]) June 2015    Triple negative, T1c, N2a.Treated with Cytoxan and Adriamycin in a dose dense fashion followed by carbotaxol.    . Breast cancer ([place])   . Bronchitis   . Allergy   . Asthma     Past Surgical History  Procedure Laterality Date  . Dilation and curettage of uterus      Dr Vernie Ammons  . Portacath placement  11-02-13  . Breast surgery Right 09-27-13    Wide excision of Right breast lesion with attempted sentinel node biopsy followed by axillary dissection  . Colonoscopy N/A 02/23/2015    Procedure: COLONOSCOPY;  Surgeon: Manya Silvas, MD;  Location: [facility] ENDOSCOPY;  Service: Endoscopy;  Laterality: N/A;   Needs early AM - IDDM    There were no vitals filed for this visit.  Visit Diagnosis:  Primary osteoarthritis of both knees  Arthralgia of both knees  Weakness      Subjective Assessment - 06/06/15 1739    Subjective L hip was doing super good the day before yesterday until doing some exercises at the pool for her class. Also got arch supports. It helps. 5/10 L rear end currenlty. The knees are doing  good. Just gets some discomfort once in a blue moon. 0/10 bilateral knee pain currently.    Pertinent History Bilateral knee pain.  Finished last chemotherapy for breast cancer January 2016. Following chemo and radiation, pt knees started bothering her. Feels like a tendon in the back of her L knee bothered her. Had x-rays which revealed thinning of cartilage of both knees. L knee bothers her more than the R. Weather bothers her knees.  Pt also feels pain in her L lower glute.  Pt also takes care of  her grandchildren (65.24 year old, and 62 months old). L glute pain began about a week ago, pt thinks she might have aggravated it by bending over (aggravating factors include bending over and picking up a 23 lb baby).  Has hx of L breast cancer, S/P lumpectomy. Per pt she had 2 mamograms which revealed no cancer afterwards.    Patient Stated Goals Learn how to perform her body better so that she does not damage it more.    Currently in Pain? Yes   Pain Score 5   L glute   Multiple Pain Sites No          Objectives  There-ex  Directed patient with prone L hip extension SLR 10x3 concentric contraction,  then eccentric contraction 10x3 L hip extension supine bridge 10x3 with 5  second holds  Supine bridge with feet further in front for hamstrings use 5x Supine hamstring stretch 10 seconds x 5 each LE  S/L hip abduction 10x3 each LE Standing hip flexion with occasional ipsilateral shoulder flexion with emphasis on level pelvis 10x3 each LE for glute med use Forward wedding march 32 ft x 4 holding onto 6 lb weights (decreased L hip discomfort). Reviewed and given as part of her HEP. Pt demonstrated and verbalized understanding.    Improved exercise technique, movement at target joints, use of target muscles after mod verbal, visual, tactile cues.      Decreased L posterior hip discomfort with forward wedding march. Pt tolerated session well without complain of knee pain bilaterally.  Good carry over of progress with decreased knee pain from one session to the next.            PT Education - 06/06/15 1745    Education provided Yes   Education Details ther-ex, HEP   Person(s) Educated Patient   Methods Explanation;Demonstration;Tactile cues;Verbal cues   Comprehension Verbalized understanding;Returned demonstration             PT Long Term Goals - 05/23/15 1857    PT LONG TERM GOAL #1   Title Patient will have a decrease in bilateral knee pain to 4/10 or less at worst to promote ability to stand and take care of her grandchildren.    Baseline 10/10 bilateral knee pain at worst.   Time 8   Period Weeks   Status New   PT LONG TERM GOAL #2   Title Patient will improve her LEFS score by at least 9 points as a demonstration of improved function.    Baseline 41/80   Time 8   Period Weeks   Status New   PT LONG TERM GOAL #3   Title Patient will improve bilateral hip strength by at least 1/2 MMT grade to promote ability to perform standing tasks with less knee pain.    Time 8   Period Weeks   Status New               Plan - 06/06/15 1745    Clinical Impression Statement Decreased L posterior hip discomfort with forward wedding march. Pt tolerated session well without complain of knee pain bilaterally. Good carry over of progress with decreased knee pain from one session to the next.   Pt will benefit from skilled therapeutic intervention in order to improve on the following deficits Pain;Decreased strength;Abnormal gait;Improper body mechanics;Postural dysfunction;Difficulty walking   Rehab Potential Good   Clinical Impairments Affecting Rehab Potential no known clinical impairements affecting rehab potential   PT Frequency 2x / week   PT Duration 8 weeks   PT Treatment/Interventions Therapeutic activities;Therapeutic exercise;Manual techniques;Aquatic Therapy;Gait training;Electrical Stimulation;Iontophoresis 4mg /ml  Dexamethasone;Ultrasound;Patient/family education;Neuromuscular re-education   PT Next Visit Plan hip strengthening, femoral control   Consulted and Agree with Plan of Care Patient        Problem List Patient Active Problem List   Diagnosis Date Noted  . Benign essential HTN 08/03/2014  . Diabetes mellitus, type 2 ([place]) 08/03/2014  . Combined fat and carbohydrate induced hyperlipemia 08/03/2014  . Gastro-esophageal reflux disease without esophagitis 08/03/2014  . Cellulitis of other specified site 11/02/2013  . Breast cancer of lower-inner quadrant of right female breast [facility]) 08/09/2013    [name] PT, DPT   06/06/2015, 6:45 PM  [facility] [address] [place],  [place], [address] Phone: [phone]   Fax:  [phone]  Name: Felicia Kidd MRN: [id number] Date of Birth: 09-27-1960

## 2015-06-08 ENCOUNTER — Ambulatory Visit: Payer: BLUE CROSS/BLUE SHIELD

## 2015-06-08 DIAGNOSIS — M170 Bilateral primary osteoarthritis of knee: Secondary | ICD-10-CM | POA: Diagnosis not present

## 2015-06-08 DIAGNOSIS — R531 Weakness: Secondary | ICD-10-CM

## 2015-06-08 DIAGNOSIS — M25562 Pain in left knee: Secondary | ICD-10-CM

## 2015-06-08 DIAGNOSIS — M25561 Pain in right knee: Secondary | ICD-10-CM

## 2015-06-08 NOTE — Therapy (Signed)
[facility] [address] [place], [address] Phone: [phone]   Fax:  [phone]  Physical Therapy Treatment  Patient Details  Name: Felicia Kidd MRN: [id number] Date of Birth: Apr 27, 1960 Referring Provider: Thornton Park, MD  Encounter Date: 06/08/2015      PT End of Session - 06/08/15 1738    Visit Number 6   Number of Visits 17   Date for PT Re-Evaluation 07/20/15   PT Start Time [id number]   PT Stop Time 1824   PT Time Calculation (min) 46 min   Activity Tolerance Patient tolerated treatment well   Behavior During Therapy [facility] for tasks assessed/performed      Past Medical History  Diagnosis Date  . Diabetes mellitus without complication ([place])   . Hyperlipidemia   . Hypertension   . Thyroid disease   . Breast cancer of lower-inner quadrant of right female breast [facility]) June 2015    Triple negative, T1c, N2a.Treated with Cytoxan and Adriamycin in a dose dense fashion followed by carbotaxol.    . Breast cancer ([place])   . Bronchitis   . Allergy   . Asthma     Past Surgical History  Procedure Laterality Date  . Dilation and curettage of uterus      Dr Vernie Ammons  . Portacath placement  11-02-13  . Breast surgery Right 09-27-13    Wide excision of Right breast lesion with attempted sentinel node biopsy followed by axillary dissection  . Colonoscopy N/A 02/23/2015    Procedure: COLONOSCOPY;  Surgeon: Manya Silvas, MD;  Location: [facility] ENDOSCOPY;  Service: Endoscopy;  Laterality: N/A;   Needs early AM - IDDM    There were no vitals filed for this visit.  Visit Diagnosis:  Primary osteoarthritis of both knees  Arthralgia of both knees  Weakness      Subjective Assessment - 06/08/15 1740    Subjective L hip is doing better. Both knees are doing good.    Pertinent History Bilateral knee pain.  Finished last chemotherapy for breast cancer January 2016. Following chemo and radiation, pt  knees started bothering her. Feels like a tendon in the back of her L knee bothered her. Had x-rays which revealed thinning of cartilage of both knees. L knee bothers her more than the R. Weather bothers her knees.  Pt also feels pain in her L lower glute.  Pt also takes care of  her grandchildren (61.69 year old, and 62 months old). L glute pain began about a week ago, pt thinks she might have aggravated it by bending over (aggravating factors include bending over and picking up a 23 lb baby).  Has hx of L breast cancer, S/P lumpectomy. Per pt she had 2 mamograms which revealed no cancer afterwards.    Patient Stated Goals Learn how to perform her body better so that she does not damage it more.    Currently in Pain? No/denies   Pain Score 0-No pain   Multiple Pain Sites No                      Objectives  There-ex  Directed patient with prone L hip extension SLR 10x3 concentric contraction,  then eccentric contraction 10x3 L hip extension supine bridge 10x3 with 5 second holds   Standing heel toe raises 10x3 SLS with opposite tip toe assist 10x3 with 5 second holds  Forward wedding march holding 6 lbs each UE 32 ft  x 4 Side stepping with 6 lbs each UE 32 ft to the L , 22 ft to the R (knee discomfort )  Improved exercise technique, movement at target joints, use of target muscles after mod verbal, visual, tactile cues.              PT Education - 06/08/15 1838    Education provided Yes   Education Details ther-ex   [employer]) Educated Patient   Methods Explanation;Demonstration;Verbal cues   Comprehension Verbalized understanding;Returned demonstration             PT Long Term Goals - 05/23/15 1857    PT LONG TERM GOAL #1   Title Patient will have a decrease in bilateral knee pain to 4/10 or less at worst to promote ability to stand and take care of her grandchildren.    Baseline 10/10 bilateral knee pain at worst.   Time 8   Period Weeks    Status New   PT LONG TERM GOAL #2   Title Patient will improve her LEFS score by at least 9 points as a demonstration of improved function.    Baseline 41/80   Time 8   Period Weeks   Status New   PT LONG TERM GOAL #3   Title Patient will improve bilateral hip strength by at least 1/2 MMT grade to promote ability to perform standing tasks with less knee pain.    Time 8   Period Weeks   Status New               Plan - 06/08/15 1840    Clinical Impression Statement Pt tolerated session well without aggravation of symptoms. Some knee discomfort with side stepping exercise which decreased with rest. Good hip muscle use felt with exercises. Patient making good progress towards goal of decreased knee and L hip pain.    Pt will benefit from skilled therapeutic intervention in order to improve on the following deficits Pain;Decreased strength;Abnormal gait;Improper body mechanics;Postural dysfunction;Difficulty walking   Rehab Potential Good   Clinical Impairments Affecting Rehab Potential no known clinical impairements affecting rehab potential   PT Frequency 2x / week   PT Duration 8 weeks   PT Treatment/Interventions Therapeutic activities;Therapeutic exercise;Manual techniques;Aquatic Therapy;Gait training;Electrical Stimulation;Iontophoresis 4mg /ml Dexamethasone;Ultrasound;Patient/family education;Neuromuscular re-education   PT Next Visit Plan hip strengthening, femoral control   Consulted and Agree with Plan of Care Patient        Problem List Patient Active Problem List   Diagnosis Date Noted  . Benign essential HTN 08/03/2014  . Diabetes mellitus, type 2 ([place]) 08/03/2014  . Combined fat and carbohydrate induced hyperlipemia 08/03/2014  . Gastro-esophageal reflux disease without esophagitis 08/03/2014  . Cellulitis of other specified site 11/02/2013  . Breast cancer of lower-inner quadrant of right female breast [facility]) 08/09/2013    Felicia Kidd PT, DPT   06/08/2015,  6:51 PM  [place] [facility] [address] [place], [address] Phone: [phone]   Fax:  [phone]  Name: Felicia Kidd MRN: [id number] Date of Birth: 1960-07-15

## 2015-06-12 ENCOUNTER — Ambulatory Visit: Payer: BLUE CROSS/BLUE SHIELD | Attending: Orthopedic Surgery

## 2015-06-12 DIAGNOSIS — R531 Weakness: Secondary | ICD-10-CM | POA: Diagnosis present

## 2015-06-12 DIAGNOSIS — M170 Bilateral primary osteoarthritis of knee: Secondary | ICD-10-CM | POA: Insufficient documentation

## 2015-06-12 DIAGNOSIS — M25561 Pain in right knee: Secondary | ICD-10-CM | POA: Diagnosis present

## 2015-06-12 DIAGNOSIS — M25562 Pain in left knee: Secondary | ICD-10-CM | POA: Diagnosis present

## 2015-06-12 DIAGNOSIS — M25552 Pain in left hip: Secondary | ICD-10-CM | POA: Insufficient documentation

## 2015-06-12 NOTE — Therapy (Signed)
[facility] [address] [place], [address] Phone: [phone]   Fax:  [phone]  Physical Therapy Treatment  Patient Details  Name: Felicia Kidd MRN: [id number] Date of Birth: 01-16-1961 Referring Provider: Thornton Park, MD  Encounter Date: 06/12/2015      PT End of Session - 06/12/15 1619    Visit Number 7   Number of Visits 17   Date for PT Re-Evaluation 07/20/15   PT Start Time 1619   PT Stop Time 1702   PT Time Calculation (min) 43 min   Activity Tolerance Patient tolerated treatment well   Behavior During Therapy [facility] for tasks assessed/performed      Past Medical History  Diagnosis Date  . Diabetes mellitus without complication ([place])   . Hyperlipidemia   . Hypertension   . Thyroid disease   . Breast cancer of lower-inner quadrant of right female breast [facility]) June 2015    Triple negative, T1c, N2a.Treated with Cytoxan and Adriamycin in a dose dense fashion followed by carbotaxol.    . Breast cancer ([place])   . Bronchitis   . Allergy   . Asthma     Past Surgical History  Procedure Laterality Date  . Dilation and curettage of uterus      Dr Vernie Ammons  . Portacath placement  11-02-13  . Breast surgery Right 09-27-13    Wide excision of Right breast lesion with attempted sentinel node biopsy followed by axillary dissection  . Colonoscopy N/A 02/23/2015    Procedure: COLONOSCOPY;  Surgeon: Manya Silvas, MD;  Location: [facility] ENDOSCOPY;  Service: Endoscopy;  Laterality: N/A;   Needs early AM - IDDM    There were no vitals filed for this visit.  Visit Diagnosis:  Primary osteoarthritis of both knees  Arthralgia of both knees  Weakness      Subjective Assessment - 06/12/15 1620    Subjective Yesterday both knees, and L hip botherd her. Much better today. No knee pain bilaterally currently. Just feels tiredness L hip.    Pertinent History Bilateral knee pain.  Finished last  chemotherapy for breast cancer January 2016. Following chemo and radiation, pt knees started bothering her. Feels like a tendon in the back of her L knee bothered her. Had x-rays which revealed thinning of cartilage of both knees. L knee bothers her more than the R. Weather bothers her knees.  Pt also feels pain in her L lower glute.  Pt also takes care of  her grandchildren (27.62 year old, and 21 months old). L glute pain began about a week ago, pt thinks she might have aggravated it by bending over (aggravating factors include bending over and picking up a 23 lb baby).  Has hx of L breast cancer, S/P lumpectomy. Per pt she had 2 mamograms which revealed no cancer afterwards.    Patient Stated Goals Learn how to perform her body better so that she does not damage it more.    Currently in Pain? No/denies   Pain Score 0-No pain   Multiple Pain Sites No         Objectives  There-ex  Directed patient with SLS with opposite tip toe assist 10x3 with 5 second holds for glute med use  prone L hip extension SLR 10x3 concentric contraction,  then eccentric contraction 10x3 L hip extension  supine bridge 10x3 with 5 second holds   Side stepping resisting double black band 5x each side Forward walk resisting  double black band 5x Backward walk resisting double black band 5x  Improved exercise technique, movement at target joints, use of target muscles after mod verbal, visual, tactile cues.    No knee or hip discomfort after session. Some posterior L hip discomfort with eccentric prone L hip extension which disappeared after supine bridge exercise. Patient making progress towards goals.                         PT Education - 06/12/15 1623    Education provided Yes   Education Details ther-ex   [employer]) Educated Patient   Methods Explanation;Demonstration;Tactile cues;Verbal cues   Comprehension Verbalized understanding;Returned demonstration              PT Long Term Goals - 05/23/15 1857    PT LONG TERM GOAL #1   Title Patient will have a decrease in bilateral knee pain to 4/10 or less at worst to promote ability to stand and take care of her grandchildren.    Baseline 10/10 bilateral knee pain at worst.   Time 8   Period Weeks   Status New   PT LONG TERM GOAL #2   Title Patient will improve her LEFS score by at least 9 points as a demonstration of improved function.    Baseline 41/80   Time 8   Period Weeks   Status New   PT LONG TERM GOAL #3   Title Patient will improve bilateral hip strength by at least 1/2 MMT grade to promote ability to perform standing tasks with less knee pain.    Time 8   Period Weeks   Status New               Plan - 06/12/15 1624    Clinical Impression Statement No knee or hip discomfort after session. Some posterior L hip discomfort with eccentric prone L hip extension which disappeared after supine bridge exercise. Patient making progress towards goals.   Pt will benefit from skilled therapeutic intervention in order to improve on the following deficits Pain;Decreased strength;Abnormal gait;Improper body mechanics;Postural dysfunction;Difficulty walking   Rehab Potential Good   Clinical Impairments Affecting Rehab Potential no known clinical impairements affecting rehab potential   PT Frequency 2x / week   PT Duration 8 weeks   PT Treatment/Interventions Therapeutic activities;Therapeutic exercise;Manual techniques;Aquatic Therapy;Gait training;Electrical Stimulation;Iontophoresis 4mg /ml Dexamethasone;Ultrasound;Patient/family education;Neuromuscular re-education   PT Next Visit Plan hip strengthening, femoral control   Consulted and Agree with Plan of Care Patient        Problem List Patient Active Problem List   Diagnosis Date Noted  . Benign essential HTN 08/03/2014  . Diabetes mellitus, type 2 ([place]) 08/03/2014  . Combined fat and carbohydrate induced hyperlipemia 08/03/2014  .  Gastro-esophageal reflux disease without esophagitis 08/03/2014  . Cellulitis of other specified site 11/02/2013  . Breast cancer of lower-inner quadrant of right female breast [facility]) 08/09/2013     [name] PT, DPT  06/12/2015, 7:07 PM  [place] [facility] [address] [place], [address] Phone: [phone]   Fax:  [phone]  Name: Felicia Kidd MRN: [id number] Date of Birth: 1960/07/03

## 2015-06-14 ENCOUNTER — Ambulatory Visit: Payer: BLUE CROSS/BLUE SHIELD

## 2015-06-14 DIAGNOSIS — M170 Bilateral primary osteoarthritis of knee: Secondary | ICD-10-CM

## 2015-06-14 DIAGNOSIS — M25561 Pain in right knee: Secondary | ICD-10-CM

## 2015-06-14 DIAGNOSIS — R531 Weakness: Secondary | ICD-10-CM

## 2015-06-14 DIAGNOSIS — M25562 Pain in left knee: Secondary | ICD-10-CM

## 2015-06-14 NOTE — Therapy (Signed)
[place] [facility] [address] [place], [address] Phone: [phone]   Fax:  [phone]  Physical Therapy Treatment  Patient Details  Name: Felicia Kidd MRN: [id number] Date of Birth: 1960-03-20 Referring Provider: Thornton Park, MD  Encounter Date: 06/14/2015      PT End of Session - 06/14/15 1616    Visit Number 8   Number of Visits 17   Date for PT Re-Evaluation 07/20/15   PT Start Time [id number]   PT Stop Time 1657   PT Time Calculation (min) 41 min   Activity Tolerance Patient tolerated treatment well   Behavior During Therapy [facility] for tasks assessed/performed      Past Medical History  Diagnosis Date  . Diabetes mellitus without complication ([place])   . Hyperlipidemia   . Hypertension   . Thyroid disease   . Breast cancer of lower-inner quadrant of right female breast [facility]) June 2015    Triple negative, T1c, N2a.Treated with Cytoxan and Adriamycin in a dose dense fashion followed by carbotaxol.    . Breast cancer ([place])   . Bronchitis   . Allergy   . Asthma     Past Surgical History  Procedure Laterality Date  . Dilation and curettage of uterus      Dr Vernie Ammons  . Portacath placement  11-02-13  . Breast surgery Right 09-27-13    Wide excision of Right breast lesion with attempted sentinel node biopsy followed by axillary dissection  . Colonoscopy N/A 02/23/2015    Procedure: COLONOSCOPY;  Surgeon: Manya Silvas, MD;  Location: [facility] ENDOSCOPY;  Service: Endoscopy;  Laterality: N/A;   Needs early AM - IDDM    There were no vitals filed for this visit.  Visit Diagnosis:  Primary osteoarthritis of both knees  Arthralgia of both knees  Weakness      Subjective Assessment - 06/14/15 1618    Subjective Knees are good. L hip is good.    Pertinent History Bilateral knee pain.  Finished last chemotherapy for breast cancer January 2016. Following chemo and radiation, pt knees started bothering  her. Feels like a tendon in the back of her L knee bothered her. Had x-rays which revealed thinning of cartilage of both knees. L knee bothers her more than the R. Weather bothers her knees.  Pt also feels pain in her L lower glute.  Pt also takes care of  her grandchildren (8.52 year old, and 94 months old). L glute pain began about a week ago, pt thinks she might have aggravated it by bending over (aggravating factors include bending over and picking up a 23 lb baby).  Has hx of L breast cancer, S/P lumpectomy. Per pt she had 2 mamograms which revealed no cancer afterwards.    Patient Stated Goals Learn how to perform her body better so that she does not damage it more.    Currently in Pain? No/denies   Pain Score 0-No pain   Multiple Pain Sites No      Objectives  There-ex  Directed patient with forward step up onto Air Ex pad 10x each LE  Alternating toe taps onto treadmill standing on Air Ex pad with bilateral UE assist 10x each LE, then 10x each LE with finger touch assist   SLS on Air Ex pad with opposite tip toe assist 10x3 with 5 second holds for glute med use  Forward walk resisting double black band 8x Backward walk resisting double black band  5x (some knee discomfort which decreased with rest)  Supine bridge with walk out (one step) 5x2 to promote glute max and hamstring use  Seated hamstring stretch 10 seconds x 5 each LE  Standing heel toe raises 15x2 with bilateral UE assist   Improved exercise technique, movement at target joints, use of target muscles after min to mod verbal, visual, tactile cues.      Pt tolerated session well without aggravation of knee symptoms. Some discomfort with backward walk against double black theratube which disappeared with rest. Mild crepitus L knee with forward step up onto Air Ex pad but symptom free.                            PT Education - 06/14/15 1619    Education provided Yes   Education Details ther-ex    [employer]) Educated Patient   Methods Explanation;Demonstration;Tactile cues;Verbal cues   Comprehension Verbalized understanding;Returned demonstration             PT Long Term Goals - 05/23/15 1857    PT LONG TERM GOAL #1   Title Patient will have a decrease in bilateral knee pain to 4/10 or less at worst to promote ability to stand and take care of her grandchildren.    Baseline 10/10 bilateral knee pain at worst.   Time 8   Period Weeks   Status New   PT LONG TERM GOAL #2   Title Patient will improve her LEFS score by at least 9 points as a demonstration of improved function.    Baseline 41/80   Time 8   Period Weeks   Status New   PT LONG TERM GOAL #3   Title Patient will improve bilateral hip strength by at least 1/2 MMT grade to promote ability to perform standing tasks with less knee pain.    Time 8   Period Weeks   Status New               Plan - 06/14/15 1657    Clinical Impression Statement Pt tolerated session well without aggravation of knee symptoms. Some discomfort with backward walk against double black theratube which disappeared with rest. Mild crepitus L knee with forward step up onto Air Ex pad but symptom free.    Pt will benefit from skilled therapeutic intervention in order to improve on the following deficits Pain;Decreased strength;Abnormal gait;Improper body mechanics;Postural dysfunction;Difficulty walking   Rehab Potential Good   Clinical Impairments Affecting Rehab Potential no known clinical impairements affecting rehab potential   PT Frequency 2x / week   PT Duration 8 weeks   PT Treatment/Interventions Therapeutic activities;Therapeutic exercise;Manual techniques;Aquatic Therapy;Gait training;Electrical Stimulation;Iontophoresis 4mg /ml Dexamethasone;Ultrasound;Patient/family education;Neuromuscular re-education   PT Next Visit Plan hip strengthening, femoral control   Consulted and Agree with Plan of Care Patient        Problem  List Patient Active Problem List   Diagnosis Date Noted  . Benign essential HTN 08/03/2014  . Diabetes mellitus, type 2 ([place]) 08/03/2014  . Combined fat and carbohydrate induced hyperlipemia 08/03/2014  . Gastro-esophageal reflux disease without esophagitis 08/03/2014  . Cellulitis of other specified site 11/02/2013  . Breast cancer of lower-inner quadrant of right female breast [facility]) 08/09/2013    [name] PT, DPT   06/14/2015, 7:03 PM  [place] [facility] [address], [place], [address] Phone: [phone]   Fax:  [phone]  Name: Felicia  PERSEPHANY Kidd MRN: [id number] Date of Birth: 09-15-1960

## 2015-06-19 ENCOUNTER — Ambulatory Visit: Payer: BLUE CROSS/BLUE SHIELD

## 2015-06-19 DIAGNOSIS — M25562 Pain in left knee: Secondary | ICD-10-CM

## 2015-06-19 DIAGNOSIS — M170 Bilateral primary osteoarthritis of knee: Secondary | ICD-10-CM

## 2015-06-19 DIAGNOSIS — R531 Weakness: Secondary | ICD-10-CM

## 2015-06-19 DIAGNOSIS — M25561 Pain in right knee: Secondary | ICD-10-CM

## 2015-06-19 NOTE — Therapy (Signed)
[place] [facility] [address], [place], [address] Phone: [phone]   Fax:  [phone]  Physical Therapy Treatment  Patient Details  Name: Felicia Kidd MRN: [id number] Date of Birth: [birth year] Referring Provider: Thornton Park, MD  Encounter Date: 06/19/2015      PT End of Session - 06/19/15 1620    Visit Number 9   Number of Visits 17   Date for PT Re-Evaluation 07/20/15   PT Start Time 1620   PT Stop Time 1702   PT Time Calculation (min) 42 min   Activity Tolerance Patient tolerated treatment well   Behavior During Therapy [facility] for tasks assessed/performed      Past Medical History  Diagnosis Date  . Diabetes mellitus without complication ([place])   . Hyperlipidemia   . Hypertension   . Thyroid disease   . Breast cancer of lower-inner quadrant of right female breast [facility]) June 2015    Triple negative, T1c, N2a.Treated with Cytoxan and Adriamycin in a dose dense fashion followed by carbotaxol.    . Breast cancer ([place])   . Bronchitis   . Allergy   . Asthma     Past Surgical History  Procedure Laterality Date  . Dilation and curettage of uterus      Dr Vernie Ammons  . Portacath placement  11-02-13  . Breast surgery Right 09-27-13    Wide excision of Right breast lesion with attempted sentinel node biopsy followed by axillary dissection  . Colonoscopy N/A 02/23/2015    Procedure: COLONOSCOPY;  Surgeon: Manya Silvas, MD;  Location: [facility] ENDOSCOPY;  Service: Endoscopy;  Laterality: N/A;   Needs early AM - IDDM    There were no vitals filed for this visit.      Subjective Assessment - 06/19/15 1621    Subjective Both knees were a little rough this morning but better now. L hip is fine   Pertinent History Bilateral knee pain.  Finished last chemotherapy for breast cancer January 2016. Following chemo and radiation, pt knees started bothering her. Feels like a tendon in the back of her L knee  bothered her. Had x-rays which revealed thinning of cartilage of both knees. L knee bothers her more than the R. Weather bothers her knees.  Pt also feels pain in her L lower glute.  Pt also takes care of  her grandchildren (80.57 year old, and 5 months old). L glute pain began about a week ago, pt thinks she might have aggravated it by bending over (aggravating factors include bending over and picking up a 23 lb baby).  Has hx of L breast cancer, S/P lumpectomy. Per pt she had 2 mamograms which revealed no cancer afterwards.    Patient Stated Goals Learn how to perform her body better so that she does not damage it more.    Currently in Pain? No/denies   Pain Score 0-No pain   Multiple Pain Sites No            [facility] PT Assessment - 06/19/15 1641    Observation/Other Assessments   Lower Extremity Functional Scale  53/80   Strength   Right Hip Flexion 4/5   Right Hip Extension 4/5   Right Hip External Rotation  4/5   Right Hip Internal Rotation 4+/5   Right Hip ABduction 4+/5  S/L hip abduction   Left Hip Flexion 4/5   Left Hip Extension 4/5   Left Hip External Rotation 4/5   Left  Hip Internal Rotation 4+/5   Left Hip ABduction 5/5          Objectives  There-ex   Directed patient with standing pall off press resisting double black band 5x5 second holds for 2 sets each side to promote glute med muscle use  SLS on Air Ex pad with opposite tip toe assist 10x3 with 5 second holds for glute med use  Standing heel toe raises 15x2 with bilateral UE assist  Seated hamstring stretch 10 seconds x 6 each LE  Seated manually resisted hip flexion, ER, IR, S/L hip abduction, prone glute max extension 1-2x each way for each LE  Reviewed progress/current status with LE strength with pt  Alternating toe taps onto treadmill standing on Air Ex pad 10x2 each LE with finger touch assist for glute med muscle use  Improved exercise technique, movement at target joints, use of target muscles  after min to mod verbal, visual, tactile cues.         Pt tolerated session well without aggravation of symptoms. Improved LEFS score since initial evaluation suggesting improved function. Patient also demonstrates overall improved pain level since start of physical therapy. Pt making very good progress towards goals.                         PT Education - 06/19/15 1626    Education provided Yes   Education Details ther-ex   [employer]) Educated Patient   Methods Explanation;Demonstration;Tactile cues;Verbal cues   Comprehension Verbalized understanding             PT Long Term Goals - 06/19/15 1652    PT LONG TERM GOAL #1   Title Patient will have a decrease in bilateral knee pain to 4/10 or less at worst to promote ability to stand and take care of her grandchildren.    Baseline 10/10 bilateral knee pain at worst at time of evaluation. Overall, 4/10 at most for the pst 5 days.    Time 8   Period Weeks   Status Achieved   PT LONG TERM GOAL #2   Title Patient will improve her LEFS score by at least 9 points as a demonstration of improved function.    Baseline 41/80; current score: 53/80   Time 8   Period Weeks   Status Achieved   PT LONG TERM GOAL #3   Title Patient will improve bilateral hip strength by at least 1/2 MMT grade to promote ability to perform standing tasks with less knee pain.    Time 8   Period Weeks   Status Partially Met               Plan - 06/19/15 1619    Clinical Impression Statement Pt tolerated session well without aggravation of symptoms. Improved LEFS score since initial evaluation suggesting improved function. Patient also demonstrates overall improved pain level since start of physical therapy. Pt making very good progress towards goals.    Rehab Potential Good   Clinical Impairments Affecting Rehab Potential no known clinical impairements affecting rehab potential   PT Frequency 2x / week   PT Duration 8 weeks   PT  Treatment/Interventions Therapeutic activities;Therapeutic exercise;Manual techniques;Aquatic Therapy;Gait training;Electrical Stimulation;Iontophoresis 70m/ml Dexamethasone;Ultrasound;Patient/family education;Neuromuscular re-education   PT Next Visit Plan hip strengthening, femoral control   Consulted and Agree with Plan of Care Patient      Patient will benefit from skilled therapeutic intervention in order to improve the following deficits and impairments:  Pain, Decreased strength, Abnormal gait, Improper body mechanics, Postural dysfunction, Difficulty walking  Visit Diagnosis: Primary osteoarthritis of both knees  Arthralgia of both knees  Weakness     Problem List Patient Active Problem List   Diagnosis Date Noted  . Benign essential HTN 08/03/2014  . Diabetes mellitus, type 2 ([place]) 08/03/2014  . Combined fat and carbohydrate induced hyperlipemia 08/03/2014  . Gastro-esophageal reflux disease without esophagitis 08/03/2014  . Cellulitis of other specified site 11/02/2013  . Breast cancer of lower-inner quadrant of right female breast [facility]) 08/09/2013    [name] PT, DPT   06/19/2015, 7:25 PM  [place] [facility] [address], [place], [address] Phone: [phone]   Fax:  [phone]  Name: Felicia Kidd MRN: [id number] Date of Birth: 1960/05/22

## 2015-06-21 ENCOUNTER — Ambulatory Visit: Payer: BLUE CROSS/BLUE SHIELD

## 2015-06-21 DIAGNOSIS — M25562 Pain in left knee: Secondary | ICD-10-CM

## 2015-06-21 DIAGNOSIS — M170 Bilateral primary osteoarthritis of knee: Secondary | ICD-10-CM | POA: Diagnosis not present

## 2015-06-21 DIAGNOSIS — M25561 Pain in right knee: Secondary | ICD-10-CM

## 2015-06-21 DIAGNOSIS — M25552 Pain in left hip: Secondary | ICD-10-CM

## 2015-06-21 NOTE — Therapy (Signed)
[place] [facility] [address] [place], [address] Phone: [phone]   Fax:  [phone]  Physical Therapy Treatment And Progress Report  Patient Details  Name: Felicia Kidd MRN: [id number] Date of Birth: February 09, 1961 Referring Provider: Thornton Park, MD  Encounter Date: 06/21/2015      PT End of Session - 06/21/15 1619    Visit Number 10   Number of Visits 17   Date for PT Re-Evaluation 07/20/15   PT Start Time 1619   PT Stop Time 1705   PT Time Calculation (min) 46 min   Activity Tolerance Patient tolerated treatment well   Behavior During Therapy [facility] for tasks assessed/performed      Past Medical History  Diagnosis Date  . Diabetes mellitus without complication ([place])   . Hyperlipidemia   . Hypertension   . Thyroid disease   . Breast cancer of lower-inner quadrant of right female breast [facility]) June 2015    Triple negative, T1c, N2a.Treated with Cytoxan and Adriamycin in a dose dense fashion followed by carbotaxol.    . Breast cancer ([place])   . Bronchitis   . Allergy   . Asthma     Past Surgical History  Procedure Laterality Date  . Dilation and curettage of uterus      Dr Vernie Ammons  . Portacath placement  11-02-13  . Breast surgery Right 09-27-13    Wide excision of Right breast lesion with attempted sentinel node biopsy followed by axillary dissection  . Colonoscopy N/A 02/23/2015    Procedure: COLONOSCOPY;  Surgeon: Manya Silvas, MD;  Location: [facility] ENDOSCOPY;  Service: Endoscopy;  Laterality: N/A;   Needs early AM - IDDM    There were no vitals filed for this visit.      Subjective Assessment - 06/21/15 1621    Subjective Knees are good. No pain or discomfort. L hip feels really good. The only time she has discomfort is when she sits in the car or on her recliner but its much greatly improved. Pt states having 4 steps in the front of her house with bilateral rail, 2 steps on the  back. Steps are not too bad for her knees. Pt also adds that the prone glute sets help her L hip a lot.   Pertinent History Bilateral knee pain.  Finished last chemotherapy for breast cancer January 2016. Following chemo and radiation, pt knees started bothering her. Feels like a tendon in the back of her L knee bothered her. Had x-rays which revealed thinning of cartilage of both knees. L knee bothers her more than the R. Weather bothers her knees.  Pt also feels pain in her L lower glute.  Pt also takes care of  her grandchildren (58.90 year old, and 80 months old). L glute pain began about a week ago, pt thinks she might have aggravated it by bending over (aggravating factors include bending over and picking up a 23 lb baby).  Has hx of L breast cancer, S/P lumpectomy. Per pt she had 2 mamograms which revealed no cancer afterwards.    Patient Stated Goals Learn how to perform her body better so that she does not damage it more.    Currently in Pain? No/denies   Pain Score 0-No pain   Multiple Pain Sites No          Objectives  There-ex   Directed patient with standing pallof press resisting double black band 10x5 second holds for 2  sets each side to promote glute med muscle use  Alternating toe taps onto treadmill standing on Air Ex pad 10x3 each LE with finger touch assist for glute med muscle use  Forward step up and over Air Ex pad 10x3 each LE with one UE assist  T-band side step red band 32 ft 2x each direction  Seated L hamstring stretch 30 seconds x 3  Prone L hip extension 10x3 concentric  Then 10x2 eccentric  Standing heel toe raises 15x2 with bilateral UE assist   Reviewed progress with LEFS with pt.   Improved exercise technique, movement at target joints, use of target muscles after mod verbal, visual, tactile cues.      Patient has demonstrated overall improved hip strength, decreased bilateral knee pain and L hip pain, and improved function since her initial  evaluation. Pt still demonstrates some difficulty with functional tasks such as lifting an object such as a bag of groceries from the floor, walking between rooms or walking about 2 blocks, and going up and down stairs. Patient will benefit from continued skilled physical therapy services continue decreasing knee and hip pain as well as to continue to improve function.                PT Education - 06/21/15 1625    Education provided Yes   Education Details ther-ex   [employer]) Educated Patient   Methods Explanation;Demonstration;Tactile cues;Verbal cues   Comprehension Verbalized understanding;Returned demonstration             PT Long Term Goals - 06/21/15 1706    PT LONG TERM GOAL #1   Title Patient will have a decrease in bilateral knee pain to 4/10 or less at worst to promote ability to stand and take care of her grandchildren.    Baseline 10/10 bilateral knee pain at worst at time of evaluation. Overall, 4/10 at most for the pst 5 days.    Time 8   Period Weeks   Status Achieved   PT LONG TERM GOAL #2   Title Patient will improve her LEFS score by at least 9 points as a demonstration of improved function.    Baseline 41/80; current score: 53/80   Time 8   Period Weeks   Status Achieved   PT LONG TERM GOAL #3   Title Patient will improve bilateral hip strength by at least 1/2 MMT grade to promote ability to perform standing tasks with less knee pain.    Time 8   Period Weeks   Status Partially Met   PT LONG TERM GOAL #4   Title Patient will improve her LEFS score to at least 58/80 as a demonstration of improved function.    Baseline 53/80   Time 4   Period Weeks   Status New               Plan - 06/21/15 1625    Clinical Impression Statement Patient has demonstrated overall improved hip strength, decreased bilateral knee pain and L hip pain, and improved function since her initial evaluation. Pt still demonstrates some difficulty with functional tasks  such as lifting an object such as a bag of groceries from the floor, walking between rooms or walking about 2 blocks, and going up and down stairs. Patient will benefit from continued skilled physical therapy services continue decreasing knee and hip pain as well as to continue to improve function.    Rehab Potential Good   Clinical Impairments Affecting Rehab Potential no  known clinical impairements affecting rehab potential   PT Frequency 2x / week   PT Duration 8 weeks   PT Treatment/Interventions Therapeutic activities;Therapeutic exercise;Manual techniques;Aquatic Therapy;Gait training;Electrical Stimulation;Iontophoresis 72m/ml Dexamethasone;Ultrasound;Patient/family education;Neuromuscular re-education   PT Next Visit Plan hip strengthening, femoral control   Consulted and Agree with Plan of Care Patient      Patient will benefit from skilled therapeutic intervention in order to improve the following deficits and impairments:  Pain, Decreased strength, Abnormal gait, Improper body mechanics, Postural dysfunction, Difficulty walking  Visit Diagnosis: Pain in right knee - Plan: PT plan of care cert/re-cert  Pain in left knee - Plan: PT plan of care cert/re-cert  Pain in left hip - Plan: PT plan of care cert/re-cert     Problem List Patient Active Problem List   Diagnosis Date Noted  . Benign essential HTN 08/03/2014  . Diabetes [place], type 2 ([place] 08/03/2014  . Combined fat and carbohydrate induced hyperlipemia 08/03/2014  . Gastro-esophageal reflux disease without esophagitis 08/03/2014  . Cellulitis of other specified site 11/02/2013  . Breast [place] of [place]inner [place] of right [place] breast ([place] 08/09/2013    Thank you for your referral.  MJoneen BoersPT, DPT   06/21/2015, 5:26 PM  [facility] [address] [place] [address]Phone: [phone]  Fax:  [phone] Name: Felicia ROHMRN: [id number] of Birth: [birth year]

## 2015-06-26 ENCOUNTER — Ambulatory Visit: Payer: BLUE CROSS/BLUE SHIELD

## 2015-06-26 DIAGNOSIS — M25562 Pain in left knee: Secondary | ICD-10-CM

## 2015-06-26 DIAGNOSIS — M25561 Pain in right knee: Secondary | ICD-10-CM

## 2015-06-26 DIAGNOSIS — M170 Bilateral primary osteoarthritis of knee: Secondary | ICD-10-CM | POA: Diagnosis not present

## 2015-06-26 DIAGNOSIS — M25552 Pain in left hip: Secondary | ICD-10-CM

## 2015-06-26 NOTE — Therapy (Signed)
[facility] [address], [place], [address] Phone: [phone]   Fax:  [phone]  Physical Therapy Treatment  Patient Details  Name: Felicia Kidd MRN: [id number] Date of Birth: 12-Jan-1961 Referring Provider: Thornton Park, MD  Encounter Date: 06/26/2015      PT End of Session - 06/26/15 1620    Visit Number 11   Number of Visits 17   Date for PT Re-Evaluation 07/20/15   PT Start Time 1620   PT Stop Time 1702   PT Time Calculation (min) 42 min   Activity Tolerance Patient tolerated treatment well   Behavior During Therapy [facility] for tasks assessed/performed      Past Medical History  Diagnosis Date  . Diabetes mellitus without complication ([place])   . Hyperlipidemia   . Hypertension   . Thyroid disease   . Breast cancer of lower-inner quadrant of right female breast [facility]) June 2015    Triple negative, T1c, N2a.Treated with Cytoxan and Adriamycin in a dose dense fashion followed by carbotaxol.    . Breast cancer ([place])   . Bronchitis   . Allergy   . Asthma     Past Surgical History  Procedure Laterality Date  . Dilation and curettage of uterus      Dr Vernie Ammons  . Portacath placement  11-02-13  . Breast surgery Right 09-27-13    Wide excision of Right breast lesion with attempted sentinel node biopsy followed by axillary dissection  . Colonoscopy N/A 02/23/2015    Procedure: COLONOSCOPY;  Surgeon: Manya Silvas, MD;  Location: [facility] ENDOSCOPY;  Service: Endoscopy;  Laterality: N/A;   Needs early AM - IDDM    There were no vitals filed for this visit.      Subjective Assessment - 06/26/15 1622    Subjective Pt states the T-band exercise bothered her knees the other day. Knees are much better today. 3/10 this morning. 2/10 currently. Much better.    Pertinent History Bilateral knee pain.  Finished last chemotherapy for breast cancer January 2016. Following chemo and radiation, pt knees  started bothering her. Feels like a tendon in the back of her L knee bothered her. Had x-rays which revealed thinning of cartilage of both knees. L knee bothers her more than the R. Weather bothers her knees.  Pt also feels pain in her L lower glute.  Pt also takes care of  her grandchildren (69.35 year old, and 36 months old). L glute pain began about a week ago, pt thinks she might have aggravated it by bending over (aggravating factors include bending over and picking up a 23 lb baby).  Has hx of L breast cancer, S/P lumpectomy. Per pt she had 2 mamograms which revealed no cancer afterwards.    Patient Stated Goals Learn how to perform her body better so that she does not damage it more.    Currently in Pain? Yes   Pain Score 2    Multiple Pain Sites No        Objectives  Held off t-band side step   There-ex  Directed patient with supine bridge 10x3 with 5 seconds holds  S/L hip abduction 10x3 each LE  Prone glute max extension (2 pillows under abdomen and hip) 10x3 each LE  SLS on Air Ex pad with opposite tip toe assist 10x3 with 5 second holds for 3 sets each LE for glute med use  Forward step up onto Air Ex Pad 10x  each LE  Standing heel-toe raises 20x each way   Improved exercise technique, movement at target joints, use of target muscles after min to mod verbal, visual, tactile cues.     Pt tolerated session well without aggravation of symptoms. Good glute med and max muscle use felt with exercises. Slight R knee discomfort with forward step ups onto Air-ex pad which decreased with rest.                         PT Education - 06/26/15 1624    Education provided Yes   Education Details ther-ex   Person(s) Educated Patient   Methods Explanation;Demonstration;Verbal cues   Comprehension Verbalized understanding;Returned demonstration             PT Long Term Goals - 06/21/15 1706    PT LONG TERM GOAL #1   Title Patient will have a decrease in  bilateral knee pain to 4/10 or less at worst to promote ability to stand and take care of her grandchildren.    Baseline 10/10 bilateral knee pain at worst at time of evaluation. Overall, 4/10 at most for the pst 5 days.    Time 8   Period Weeks   Status Achieved   PT LONG TERM GOAL #2   Title Patient will improve her LEFS score by at least 9 points as a demonstration of improved function.    Baseline 41/80; current score: 53/80   Time 8   Period Weeks   Status Achieved   PT LONG TERM GOAL #3   Title Patient will improve bilateral hip strength by at least 1/2 MMT grade to promote ability to perform standing tasks with less knee pain.    Time 8   Period Weeks   Status Partially Met   PT LONG TERM GOAL #4   Title Patient will improve her LEFS score to at least 58/80 as a demonstration of improved function.    Baseline 53/80   Time 4   Period Weeks   Status New               Plan - 06/26/15 1625    Clinical Impression Statement Pt tolerated session well without aggravation of symptoms. Good glute med and max muscle use felt with exercises. Slight R knee discomfort with forward step ups onto Air-ex pad which decreased with rest.   Rehab Potential Good   Clinical Impairments Affecting Rehab Potential no known clinical impairements affecting rehab potential   PT Frequency 2x / week   PT Duration 8 weeks   PT Treatment/Interventions Therapeutic activities;Therapeutic exercise;Manual techniques;Aquatic Therapy;Gait training;Electrical Stimulation;Iontophoresis 72m/ml Dexamethasone;Ultrasound;Patient/family education;Neuromuscular re-education   PT Next Visit Plan hip strengthening, femoral control   Consulted and Agree with Plan of Care Patient      Patient will benefit from skilled therapeutic intervention in order to improve the following deficits and impairments:  Pain, Decreased strength, Abnormal gait, Improper body mechanics, Postural dysfunction, Difficulty walking  Visit  Diagnosis: Pain in right knee  Pain in left knee  Pain in left hip     Problem List Patient Active Problem List   Diagnosis Date Noted  . Benign essential HTN 08/03/2014  . Diabetes mellitus, type 2 ([place] 08/03/2014  . Combined fat and carbohydrate induced hyperlipemia 08/03/2014  . Gastro-esophageal reflux disease without esophagitis 08/03/2014  . Cellulitis of other specified site 11/02/2013  . Breast cancer of lower-inner quadrant of right female breast ([place] 08/09/2013    [facility]  Tama Grosz PT, DPT   06/26/2015, 5:09 PM  [facility] [address] [place], [address] Phone: [phone]   Fax:  [phone]  Name: Felicia Kidd MRN: [id number] Date of Birth: 1960/06/07

## 2015-06-28 ENCOUNTER — Ambulatory Visit: Payer: BLUE CROSS/BLUE SHIELD

## 2015-06-28 DIAGNOSIS — M170 Bilateral primary osteoarthritis of knee: Secondary | ICD-10-CM | POA: Diagnosis not present

## 2015-06-28 DIAGNOSIS — M25562 Pain in left knee: Secondary | ICD-10-CM

## 2015-06-28 DIAGNOSIS — M25561 Pain in right knee: Secondary | ICD-10-CM

## 2015-06-28 DIAGNOSIS — M25552 Pain in left hip: Secondary | ICD-10-CM

## 2015-06-28 NOTE — Therapy (Signed)
[facility] [address] [place], [address] Phone: [phone]   Fax:  [phone]  Physical Therapy Treatment  Patient Details  Name: Felicia Kidd MRN: [id number] Date of Birth: [birth year] Referring Provider: Thornton Park, MD  Encounter Date: 06/28/2015      PT End of Session - 06/28/15 1614    Visit Number 12   Number of Visits 17   Date for PT Re-Evaluation 07/20/15   PT Start Time 6962   PT Stop Time 1700   PT Time Calculation (min) 46 min   Activity Tolerance Patient tolerated treatment well   Behavior During Therapy [facility] for tasks assessed/performed      Past Medical History  Diagnosis Date  . Diabetes mellitus without complication ([place])   . Hyperlipidemia   . Hypertension   . Thyroid disease   . Breast cancer of lower-inner quadrant of right female breast [facility]) June 2015    Triple negative, T1c, N2a.Treated with Cytoxan and Adriamycin in a dose dense fashion followed by carbotaxol.    . Breast cancer ([place])   . Bronchitis   . Allergy   . Asthma     Past Surgical History  Procedure Laterality Date  . Dilation and curettage of uterus      Dr Vernie Ammons  . Portacath placement  11-02-13  . Breast surgery Right 09-27-13    Wide excision of Right breast lesion with attempted sentinel node biopsy followed by axillary dissection  . Colonoscopy N/A 02/23/2015    Procedure: COLONOSCOPY;  Surgeon: Manya Silvas, MD;  [facility] END[facility]COPY;  Service: Endoscopy;  Laterality: N/A;   Needs early AM - IDDM    There were no vitals filed for this visit.      Subjective Assessment - 06/28/15 1616    Subjective Knees are doing good and hamstrings are doing good too.    Pertinent History Bilateral knee pain.  Finished last chemotherapy for breast cancer January 2016. Following chemo and radiation, pt knees started bothering her. Feels like a tendon in the back of her L knee bothered her. Had  x-rays which revealed thinning of cartilage of both knees. L knee bothers her more than the R. Weather bothers her knees.  Pt also feels pain in her L lower glute.  Pt also takes care of  her grandchildren (31.74 year old, and 36 months old). L glute pain began about a week ago, pt thinks she might have aggravated it by bending over (aggravating factors include bending over and picking up a 23 lb baby).  Has hx of L breast cancer, S/P lumpectomy. Per pt she had 2 mamograms which revealed no cancer afterwards.    Patient Stated Goals Learn how to perform her body better so that she does not damage it more.    Currently in Pain? No/denies   Pain Score 0-No pain   Multiple Pain Sites No         Objectives  There-ex  Directed patient with supine bridge 10x3 with 5 seconds holds  S/L hip abduction 10x3 each LE  Prone glute max extension (2 pillows under abdomen and hip) 10x3 each LE  Standing pallof press resisting double black band 10x5 second holds for 2 sets each side to promote glute med muscle use   Lateral step up/down on Air Ex pad 10x2 each LE  Side step at Air Ex pad 5 ft x 5 each direction     Improved exercise  technique, movement at target joints, use of target muscles after min to mod verbal, visual, tactile cues.     Pt tolerated session very well without complain of increased knee or hip pain. Pt making good progress towards goals.             PT Education - 06/28/15 1617    Education provided Yes   Education Details ther-ex   Person(s) Educated Patient   Methods Explanation;Demonstration;Verbal cues   Comprehension Verbalized understanding;Returned demonstration             PT Long Term Goals - 06/21/15 1706    PT LONG TERM GOAL #1   Title Patient will have a decrease in bilateral knee pain to 4/10 or less at worst to promote ability to stand and take care of her grandchildren.    Baseline 10/10 bilateral knee pain at worst at time of evaluation.  Overall, 4/10 at most for the pst 5 days.    Time 8   Period Weeks   Status Achieved   PT LONG TERM GOAL #2   Title Patient will improve her LEFS score by at least 9 points as a demonstration of improved function.    Baseline 41/80; current score: 53/80   Time 8   Period Weeks   Status Achieved   PT LONG TERM GOAL #3   Title Patient will improve bilateral hip strength by at least 1/2 MMT grade to promote ability to perform standing tasks with less knee pain.    Time 8   Period Weeks   Status Partially Met   PT LONG TERM GOAL #4   Title Patient will improve her LEFS score to at least 58/80 as a demonstration of improved function.    Baseline 53/80   Time 4   Period Weeks   Status New               Plan - 06/28/15 1611    Clinical Impression Statement Pt tolerated session very well without complain of increased knee or hip pain. Pt making good progress towards goals.    Rehab Potential Good   Clinical Impairments Affecting Rehab Potential no known clinical impairements affecting rehab potential   PT Frequency 2x / week   PT Duration 8 weeks   PT Treatment/Interventions Therapeutic activities;Therapeutic exercise;Manual techniques;Aquatic Therapy;Gait training;Electrical Stimulation;Iontophoresis 56m/ml Dexamethasone;Ultrasound;Patient/family education;Neuromuscular re-education   PT Next Visit Plan hip strengthening, femoral control   Consulted and Agree with Plan of Care Patient      Patient will benefit from skilled therapeutic intervention in order to improve the following deficits and impairments:  Pain, Decreased strength, Abnormal gait, Improper body mechanics, Postural dysfunction, Difficulty walking  Visit Diagnosis: Pain in right knee  Pain in left knee  Pain in left hip     Problem List Patient Active Problem List   Diagnosis Date Noted  . Benign essential HTN 08/03/2014  . Diabetes mellitus, type 2 ([place] 08/03/2014  . Combined fat and carbohydrate  induced hyperlipemia 08/03/2014  . Gastro-esophageal reflux disease without esophagitis 08/03/2014  . Cellulitis of other specified site 11/02/2013  . Breast cancer of lower-inner quadrant of right female breast ([facility] 08/09/2013    MJoneen BoersPT, DPT   06/28/2015, 7:22 PM  [facility] [address] [place] [address]Phone: [phone]  Fax:  [phone] Name: Felicia LIZANAMRN: [id number] of Birth: 3July 09, 1962

## 2015-07-04 ENCOUNTER — Ambulatory Visit: Payer: BLUE CROSS/BLUE SHIELD

## 2015-07-04 DIAGNOSIS — M25562 Pain in left knee: Secondary | ICD-10-CM

## 2015-07-04 DIAGNOSIS — M25561 Pain in right knee: Secondary | ICD-10-CM

## 2015-07-04 DIAGNOSIS — M25552 Pain in left hip: Secondary | ICD-10-CM

## 2015-07-04 DIAGNOSIS — M170 Bilateral primary osteoarthritis of knee: Secondary | ICD-10-CM | POA: Diagnosis not present

## 2015-07-04 NOTE — Therapy (Signed)
[facility] [address], [place], [address] Phone: [phone]   Fax:  [phone]  Physical Therapy Treatment  Patient Details  Name: Felicia Kidd MRN: [id number] Date of Birth: 10-Feb-1961 Referring Provider: Thornton Park, MD  Encounter Date: 07/04/2015      PT End of Session - 07/04/15 1752    Visit Number 13   Number of Visits 17   Date for PT Re-Evaluation 07/20/15   PT Start Time 1025   PT Stop Time 1831   PT Time Calculation (min) 42 min   Activity Tolerance Patient tolerated treatment well   Behavior During Therapy [facility] for tasks assessed/performed      Past Medical History  Diagnosis Date  . Diabetes mellitus without complication ([place])   . Hyperlipidemia   . Hypertension   . Thyroid disease   . Breast cancer of lower-inner quadrant of right female breast [facility]) June 2015    Triple negative, T1c, N2a.Treated with Cytoxan and Adriamycin in a dose dense fashion followed by carbotaxol.    . Breast cancer ([place])   . Bronchitis   . Allergy   . Asthma     Past Surgical History  Procedure Laterality Date  . Dilation and curettage of uterus      Dr Vernie Ammons  . Portacath placement  11-02-13  . Breast surgery Right 09-27-13    Wide excision of Right breast lesion with attempted sentinel node biopsy followed by axillary dissection  . Colonoscopy N/A 02/23/2015    Procedure: COLONOSCOPY;  Surgeon: Manya Silvas, MD;  Location: [facility] ENDOSCOPY;  Service: Endoscopy;  Laterality: N/A;   Needs early AM - IDDM    There were no vitals filed for this visit.      Subjective Assessment - 07/04/15 1751    Subjective Pt states both knees and L hip are good.    Pertinent History Bilateral knee pain.  Finished last chemotherapy for breast cancer January 2016. Following chemo and radiation, pt knees started bothering her. Feels like a tendon in the back of her L knee bothered her. Had x-rays which  revealed thinning of cartilage of both knees. L knee bothers her more than the R. Weather bothers her knees.  Pt also feels pain in her L lower glute.  Pt also takes care of  her grandchildren (82.45 year old, and 76 months old). L glute pain began about a week ago, pt thinks she might have aggravated it by bending over (aggravating factors include bending over and picking up a 23 lb baby).  Has hx of L breast cancer, S/P lumpectomy. Per pt she had 2 mamograms which revealed no cancer afterwards.    Patient Stated Goals Learn how to perform her body better so that she does not damage it more.    Currently in Pain? No/denies   Pain Score 0-No pain   Multiple Pain Sites No        Objectives  There-ex  Directed patient with supine bridge 10x3 with 5 seconds holds  S/L hip abduction 15x3 each LE  Bridge with walk outs 5x2  Prone glute max extension (2 pillows under abdomen and hip) 10x3 each LE  Side step at [employer] Ex pad 5 ft x 10 each direction  Lateral step up/down on 3 inch step each LE  Standing pallof press resisting double black band 10x5 second holds for 2 sets each side to promote glute med muscle use   Standing forward  wedding march resisting double black theratube 5x  Improved exercise technique, movement at target joints, use of target muscles after min to mod verbal, visual, tactile cues.      Pt tolerated session well without complain of increased pain. Able to perform lateral step ups/down using 3 inch step without symptoms. Pt progressing very well with PT towards goals.                               PT Education - 07/04/15 1752    Education provided Yes   Education Details ther-ex   Person(s) Educated Patient   Methods Explanation;Demonstration;Verbal cues   Comprehension Verbalized understanding;Returned demonstration             PT Long Term Goals - 06/21/15 1706    PT LONG TERM GOAL #1   Title Patient will have a decrease in  bilateral knee pain to 4/10 or less at worst to promote ability to stand and take care of her grandchildren.    Baseline 10/10 bilateral knee pain at worst at time of evaluation. Overall, 4/10 at most for the pst 5 days.    Time 8   Period Weeks   Status Achieved   PT LONG TERM GOAL #2   Title Patient will improve her LEFS score by at least 9 points as a demonstration of improved function.    Baseline 41/80; current score: 53/80   Time 8   Period Weeks   Status Achieved   PT LONG TERM GOAL #3   Title Patient will improve bilateral hip strength by at least 1/2 MMT grade to promote ability to perform standing tasks with less knee pain.    Time 8   Period Weeks   Status Partially Met   PT LONG TERM GOAL #4   Title Patient will improve her LEFS score to at least 58/80 as a demonstration of improved function.    Baseline 53/80   Time 4   Period Weeks   Status New               Plan - 07/04/15 1753    Clinical Impression Statement Pt tolerated session well without complain of increased pain. Able to perform lateral step ups/down using 3 inch step without symptoms. Pt progressing very well with PT towards goals.   Rehab Potential Good   Clinical Impairments Affecting Rehab Potential no known clinical impairements affecting rehab potential   PT Frequency 2x / week   PT Duration 8 weeks   PT Treatment/Interventions Therapeutic activities;Therapeutic exercise;Manual techniques;Aquatic Therapy;Gait training;Electrical Stimulation;Iontophoresis 20m/ml Dexamethasone;Ultrasound;Patient/family education;Neuromuscular re-education   PT Next Visit Plan hip strengthening, femoral control   Consulted and Agree with Plan of Care Patient      Patient will benefit from skilled therapeutic intervention in order to improve the following deficits and impairments:  Pain, Decreased strength, Abnormal gait, Improper body mechanics, Postural dysfunction, Difficulty walking  Visit Diagnosis: Pain in  right knee  Pain in left knee  Pain in left hip     Problem List Patient Active Problem List   Diagnosis Date Noted  . Benign essential HTN 08/03/2014  . Diabetes mellitus, type 2 ([place] 08/03/2014  . Combined fat and carbohydrate induced hyperlipemia 08/03/2014  . Gastro-esophageal reflux disease without esophagitis 08/03/2014  . Cellulitis of other specified site 11/02/2013  . Breast cancer of [place] of right [place] breast [place] 08/09/2013    MJoneen BoersPT, DPT  07/04/2015, 6:45 PM  [facility] [address] [place], [address] Phone: [phone]   Fax:  [phone]  Name: Felicia Kidd MRN: [id number] Date of Birth: April 29, 1960

## 2015-07-06 ENCOUNTER — Ambulatory Visit: Payer: BLUE CROSS/BLUE SHIELD

## 2015-07-06 DIAGNOSIS — M170 Bilateral primary osteoarthritis of knee: Secondary | ICD-10-CM | POA: Diagnosis not present

## 2015-07-06 DIAGNOSIS — M25561 Pain in right knee: Secondary | ICD-10-CM

## 2015-07-06 DIAGNOSIS — M25552 Pain in left hip: Secondary | ICD-10-CM

## 2015-07-06 DIAGNOSIS — M25562 Pain in left knee: Secondary | ICD-10-CM

## 2015-07-06 NOTE — Therapy (Signed)
[place] [facility] [address] [place], [address] Phone: [phone]   Fax:  [phone]  Physical Therapy Treatment And Discharge Summary  Patient Details  Name: Felicia Kidd MRN: [id number] Date of Birth: [birth year] Referring Provider: Thornton Park, MD  Encounter Date: 07/06/2015      PT End of Session - 07/06/15 1750    Visit Number 14   Number of Visits 17   Date for PT Re-Evaluation 07/20/15   PT Start Time 1750   PT Stop Time 1822   PT Time Calculation (min) 32 min   Activity Tolerance Patient tolerated treatment well   Behavior During Therapy [facility] for tasks assessed/performed      Past Medical History  Diagnosis Date  . Diabetes mellitus without complication ([place])   . Hyperlipidemia   . Hypertension   . Thyroid disease   . Breast cancer of lower-inner quadrant of right female breast [facility]) June 2015    Triple negative, T1c, N2a.Treated with Cytoxan and Adriamycin in a dose dense fashion followed by carbotaxol.    . Breast cancer ([place])   . Bronchitis   . Allergy   . Asthma     Past Surgical History  Procedure Laterality Date  . Dilation and curettage of uterus      Dr Vernie Ammons  . Portacath placement  11-02-13  . Breast surgery Right 09-27-13    Wide excision of Right breast lesion with attempted sentinel node biopsy followed by axillary dissection  . Colonoscopy N/A 02/23/2015    Procedure: COLONOSCOPY;  Surgeon: Manya Silvas, MD;  Location: [facility] ENDOSCOPY;  Service: Endoscopy;  Laterality: N/A;   Needs early AM - IDDM    There were no vitals filed for this visit.      Subjective Assessment - 07/06/15 1752    Subjective Pt states that she's good to go with her HEP after PT. L hip is 0/10, bilateral knees 3/10 currently. Feels like it might be due to the weather (also feels achiness in her L arm and R shoulder). Pt states no chest pain (states that she's fine). Has been carrying  her grandchildren.    Pertinent History Bilateral knee pain.  Finished last chemotherapy for breast cancer January 2016. Following chemo and radiation, pt knees started bothering her. Feels like a tendon in the back of her L knee bothered her. Had x-rays which revealed thinning of cartilage of both knees. L knee bothers her more than the R. Weather bothers her knees.  Pt also feels pain in her L lower glute.  Pt also takes care of  her grandchildren (65.37 year old, and 7 months old). L glute pain began about a week ago, pt thinks she might have aggravated it by bending over (aggravating factors include bending over and picking up a 23 lb baby).  Has hx of L breast cancer, S/P lumpectomy. Per pt she had 2 mamograms which revealed no cancer afterwards.    Patient Stated Goals Learn how to perform her body better so that she does not damage it more.    Currently in Pain? Yes   Pain Score 3   bilateral knees   Multiple Pain Sites No            [facility] PT Assessment - 07/06/15 1807    Observation/Other Assessments   Lower Extremity Functional Scale  53/80   Strength   Right Hip Flexion 4/5   Right Hip Extension 4+/5  Right Hip External Rotation  4/5   Right Hip Internal Rotation 4+/5   Right Hip ABduction 5/5   Left Hip Flexion 4/5   Left Hip Extension 5/5   Left Hip External Rotation 4+/5   Left Hip Internal Rotation 4+/5   Left Hip ABduction 5/5       Objectives  There-ex  Directed patient with standing pallof press resisting double black band 10x5 second holds for 2 sets each side to promote glute med muscle use  Seated manually resisted hip flexion, ER, IR, S/L hip abduction, prone glute max extension 1x each way for each LE  Reviewed progress/current status with hip strength with pt.   Side step at Air Ex pad 5 ft x 9 each direction   Improved exercise technique, movement at target joints, use of target muscles after min verbal, visual, tactile cues.    2/10 knee symptoms  after session.    Pt demonstrates overall improved hip strength, especially her glute med and max muscles, decreased bilateral knee and L hip pain, and improved function as demonstrated by her improved LEFS score since her initial evaluation. Patient has made good progress with physical therapy towards goals and demonstrates independence and consistency with her exercises at home. Skilled physical therapy services discharged with patient continuing progress with her home exercises.                         PT Education - 07/06/15 1756    Education provided Yes   Education Details ther-ex   [employer]) Educated Patient   Methods Explanation;Demonstration;Tactile cues;Verbal cues   Comprehension Verbalized understanding;Returned demonstration             PT Long Term Goals - 07/06/15 1840    PT LONG TERM GOAL #1   Title Patient will have a decrease in bilateral knee pain to 4/10 or less at worst to promote ability to stand and take care of her grandchildren.    Baseline 10/10 bilateral knee pain at worst at time of evaluation. Overall, 4/10 at most for the pst 5 days.    Time 8   Period Weeks   Status Achieved   PT LONG TERM GOAL #2   Title Patient will improve her LEFS score by at least 9 points as a demonstration of improved function.    Baseline 41/80; current score: 53/80   Time 8   Period Weeks   Status Achieved   PT LONG TERM GOAL #3   Title Patient will improve bilateral hip strength by at least 1/2 MMT grade to promote ability to perform standing tasks with less knee pain.    Time 8   Period Weeks   Status Partially Met   PT LONG TERM GOAL #4   Title Patient will improve her LEFS score to at least 58/80 as a demonstration of improved function.    Baseline 53/80; current score 53/80 (07/06/2015)   Time 4   Period Weeks   Status On-going               Plan - 07/06/15 1757    Clinical Impression Statement Pt demonstrates overall improved hip  strength, especially her glute med and max muscles, decreased bilateral knee and L hip pain, and improved function as demonstrated by her improved LEFS score since her initial evaluation. Patient has made good progress with physical therapy towards goals and demonstrates independence and consistency with her exercises at home. Skilled physical therapy services  discharged with patient continuing progress with her home exercises.    Rehab Potential Good   Clinical Impairments Affecting Rehab Potential no known clinical impairements affecting rehab potential   PT Frequency 2x / week   PT Duration 8 weeks   PT Treatment/Interventions Therapeutic activities;Therapeutic exercise;Manual techniques;Aquatic Therapy;Gait training;Electrical Stimulation;Iontophoresis 69m/ml Dexamethasone;Ultrasound;Patient/family education;Neuromuscular re-education   PT Next Visit Plan hip strengthening, femoral control   Consulted and Agree with Plan of Care Patient      Patient will benefit from skilled therapeutic intervention in order to improve the following deficits and impairments:  Pain, Decreased strength, Abnormal gait, Improper body mechanics, Postural dysfunction, Difficulty walking  Visit Diagnosis: Pain in right knee  Pain in left knee  Pain in left hip     Problem List Patient Active Problem List   Diagnosis Date Noted  . Benign essential HTN 08/03/2014  . Diabetes mellitus, type 2 ([place] 08/03/2014  . Combined fat and carbohydrate induced hyperlipemia 08/03/2014  . Gastro-esophageal reflux disease without esophagitis 08/03/2014  . Cellulitis of other specified site 11/02/2013  . Breast cancer of lower-inner quadrant of right female breast ([place] 08/09/2013   Thank you for your referral.  MJoneen BoersPT, DPT   07/06/2015, 6:49 PM  [facility] [address] [place] [address]Phone: [phone]  Fax:   [phone] Name: Felicia CAPISTRANMRN: [id number] of Birth: 301/02/1961

## 2015-10-03 ENCOUNTER — Encounter: Payer: Self-pay | Admitting: Obstetrics and Gynecology

## 2015-10-03 ENCOUNTER — Ambulatory Visit (INDEPENDENT_AMBULATORY_CARE_PROVIDER_SITE_OTHER): Payer: BLUE CROSS/BLUE SHIELD | Admitting: Obstetrics and Gynecology

## 2015-10-03 VITALS — BP 140/80 | HR 83 | Ht 60.0 in | Wt 255.7 lb

## 2015-10-03 DIAGNOSIS — I10 Essential (primary) hypertension: Secondary | ICD-10-CM | POA: Diagnosis not present

## 2015-10-03 DIAGNOSIS — C50311 Malignant neoplasm of lower-inner quadrant of right female breast: Secondary | ICD-10-CM | POA: Diagnosis not present

## 2015-10-03 DIAGNOSIS — Z780 Asymptomatic menopausal state: Secondary | ICD-10-CM | POA: Diagnosis not present

## 2015-10-03 NOTE — Patient Instructions (Addendum)
1. Return in January 2018 for annual exam 2. Recommend calcium with vitamin D supplementation to help prevent osteoporosis 3. Recommend weight bearing exercise 30 minutes a day 5 days a week 4. Recommend monthly self breast exam

## 2015-10-03 NOTE — Progress Notes (Signed)
GYN ENCOUNTER NOTE  Subjective:       Felicia Kidd is a 55 y.o. G1P1 female is here for gynecologic evaluation of the following issues:  1. Establishment of care.    Patient reports no history of HRT use. Patient went through menopause without vasomotor symptoms. Bowel and bladder function are normal DEXA scan demonstrated normal bone density. Patient is status post lumpectomy, chemotherapy, and x-ray therapy for breast cancer with currently no evidence of disease She is due for annual physical in December 2018   Gynecologic History No LMP recorded. Patient is postmenopausal. Contraception: post menopausal status Last Pap: Results were: normal Last mammogram:   Obstetric History OB History  Gravida Para Term Preterm AB Living  1 1          SAB TAB Ectopic Multiple Live Births               # Outcome Date GA Lbr Len/2nd Weight Sex Delivery Anes PTL Lv  1 Para             Obstetric Comments  1st Menstrual Cycle:  15  1st Pregnancy:  19    Past Medical History:  Diagnosis Date  . Allergy   . Asthma   . Breast cancer ([place])   . Breast cancer of lower-inner quadrant of right female breast [facility]) June 2015   Triple negative, T1c, N2a.Treated with Cytoxan and Adriamycin in a dose dense fashion followed by carbotaxol.    . Bronchitis   . Diabetes mellitus without complication ([place])   . Hyperlipidemia   . Hypertension   . Thyroid disease     Past Surgical History:  Procedure Laterality Date  . BREAST SURGERY Right 09-27-13   Wide excision of Right breast lesion with attempted sentinel node biopsy followed by axillary dissection  . COLONOSCOPY N/A 02/23/2015   Procedure: COLONOSCOPY;  Surgeon: Manya Silvas, MD;  Location: [facility] ENDOSCOPY;  Service: Endoscopy;  Laterality: N/A;   Needs early AM - IDDM  . DILATION AND CURETTAGE OF UTERUS     Dr Vernie Ammons  . PORTACATH PLACEMENT  11-02-13    Current Outpatient Prescriptions on File Prior to Visit  Medication Sig  Dispense Refill  . acetaminophen (TYLENOL) 325 MG tablet Take 650 mg by mouth every 6 (six) hours as needed.    Marland Kitchen atorvastatin (LIPITOR) 40 MG tablet Take 40 mg by mouth daily.    . fluticasone (FLONASE) 50 MCG/ACT nasal spray Place 2 sprays into both nostrils daily.    . LUTEIN-ZEAXANTHIN PO Take by mouth.    . Magnesium 250 MG TABS Take by mouth.    . Multiple Vitamins-Minerals (MULTIVITAMIN WITH MINERALS) tablet Take 1 tablet by mouth daily.    . Naproxen Sodium (ALEVE PO) Take by mouth.    . Omega-3 Fatty Acids (FISH OIL PO) Take by mouth.    Marland Kitchen omeprazole (PRILOSEC) 20 MG capsule Take 20 mg by mouth daily.    . traMADol (ULTRAM) 50 MG tablet TK 1 T PO NIGHTLY PRN FOR UP TO 5 DAYS  0   Current Facility-Administered Medications on File Prior to Visit  Medication Dose Route Frequency Provider Last Rate Last Dose  . heparin lock flush 100 unit/mL  500 Units Intravenous Once Forest Gleason, MD      . sodium chloride 0.9 % injection 10 mL  10 mL Intravenous PRN Forest Gleason, MD      . sodium chloride 0.9 % injection 10 mL  10 mL Intravenous PRN  Forest Gleason, MD   10 mL at 10/26/14 0940    Allergies  Allergen Reactions  . Other Rash and Swelling    PNEUMOVAX.    Social History   Social History  . Marital status: Widowed    Spouse name: N/A  . Number of children: N/A  . Years of education: N/A   Occupational History  . Not on file.   Social History Main Topics  . Smoking status: Never Smoker  . Smokeless tobacco: Never Used  . Alcohol use No  . Drug use: No  . Sexual activity: No   Other Topics Concern  . Not on file   Social History Narrative  . No narrative on file    No family history on file.  The following portions of the patient's history were reviewed and updated as appropriate: allergies, current medications, past family history, past medical history, past social history, past surgical history and problem list.  Review of Systems Review of Systems - General  ROS: negative for - chills, fatigue, fever, hot flashes, malaise or night sweats Hematological and Lymphatic ROS: negative for - bleeding problems or swollen lymph nodes Gastrointestinal ROS: negative for - abdominal pain, blood in stools, change in bowel habits and nausea/vomiting Musculoskeletal ROS: negative for - joint pain, muscle pain or muscular weakness Genito-Urinary ROS: negative for - change in menstrual cycle, dysmenorrhea, dyspareunia, dysuria, genital discharge, genital ulcers, hematuria, incontinence, irregular/heavy menses, nocturia or pelvic pain  Objective:   BP 140/80   Pulse 83   Ht 5' (1.524 m)   Wt 255 lb 11.2 oz (116 kg)   BMI 49.94 kg/m  CONSTITUTIONAL: Well-developed, well-nourished female in no acute distress.  Physical exam-deferred   Assessment:   1. Menopause, asymptomatic 2. History of breast cancer 3. Obesity   Plan:   1. Return in January 2018 for annual exam 2. Recommend calcium with vitamin D supplementation to help Prevent osteoporosis 3. Recommend weight bearing exercise 30 minutes a day 5 days a week

## 2015-10-05 ENCOUNTER — Encounter: Payer: Self-pay | Admitting: *Deleted

## 2015-10-09 ENCOUNTER — Encounter: Payer: Self-pay | Admitting: Unknown Physician Specialty

## 2015-10-09 ENCOUNTER — Encounter: Payer: Self-pay | Admitting: General Surgery

## 2015-10-11 ENCOUNTER — Ambulatory Visit (INDEPENDENT_AMBULATORY_CARE_PROVIDER_SITE_OTHER): Payer: BLUE CROSS/BLUE SHIELD | Admitting: General Surgery

## 2015-10-11 ENCOUNTER — Encounter: Payer: Self-pay | Admitting: General Surgery

## 2015-10-11 VITALS — BP 122/78 | HR 80 | Resp 14 | Ht 64.0 in | Wt 255.0 lb

## 2015-10-11 DIAGNOSIS — C50311 Malignant neoplasm of lower-inner quadrant of right female breast: Secondary | ICD-10-CM

## 2015-10-11 NOTE — Progress Notes (Signed)
Patient ID: Felicia Kidd, female   DOB: 1960-11-21, 55 y.o.   MRN: [id number]  Chief Complaint  Patient presents with  . Follow-up    mammogram    HPI Felicia Kidd is a 55 y.o. female who presents for a breast evaluation. The most recent mammogram was done on 10/06/15. Marland Kitchen  Patient does perform regular self breast checks and gets regular mammograms done.    HPI  Past Medical History:  Diagnosis Date  . Allergy   . Asthma   . Breast cancer ([place])   . Breast cancer of lower-inner quadrant of right female breast ([place]) 08/2013   Triple negative, T1c, N2a.(6/14 nodes +) Treated with Cytoxan and Adriamycin in a dose dense fashion followed by carbotaxol.    . Bronchitis   . Diabetes mellitus without complication ([place])   . Hyperlipidemia   . Hypertension   . Thyroid disease     Past Surgical History:  Procedure Laterality Date  . BREAST SURGERY Right 09-27-13   Wide excision of Right breast lesion with attempted sentinel node biopsy followed by axillary dissection  . COLONOSCOPY N/A 02/23/2015   Procedure: COLONOSCOPY;  Surgeon: Manya Silvas, MD;  Location: [facility] ENDOSCOPY;  Service: Endoscopy;  Laterality: N/A;   Needs early AM - IDDM  . DILATION AND CURETTAGE OF UTERUS     Dr Vernie Ammons  . PORTACATH PLACEMENT  11-02-13    No family history on file.  Social History Social History  Substance Use Topics  . Smoking status: Never Smoker  . Smokeless tobacco: Never Used  . Alcohol use No    Allergies  Allergen Reactions  . Other Rash and Swelling    PNEUMOVAX.    Current Outpatient Prescriptions  Medication Sig Dispense Refill  . acetaminophen (TYLENOL) 325 MG tablet Take 650 mg by mouth every 6 (six) hours as needed.    Marland Kitchen atorvastatin (LIPITOR) 40 MG tablet Take 40 mg by mouth daily.    . celecoxib (CELEBREX) 200 MG capsule TK 1 C PO QD  2  . fluticasone (FLONASE) 50 MCG/ACT nasal spray Place 2 sprays into both nostrils daily.    . LUTEIN-ZEAXANTHIN PO Take by  mouth.    . Magnesium 250 MG TABS Take by mouth.    . meloxicam (MOBIC) 7.5 MG tablet TAKE 1 TO 2 TABLETS BY MOUTH DAILY AS NEEDED    . metFORMIN (GLUCOPHAGE) 500 MG tablet TAKE 1 TABLET(500 MG) BY MOUTH TWICE DAILY    . Multiple Vitamins-Minerals (MULTIVITAMIN WITH MINERALS) tablet Take 1 tablet by mouth daily.    . Naproxen Sodium (ALEVE PO) Take by mouth.    . Omega-3 Fatty Acids (FISH OIL PO) Take by mouth.    Marland Kitchen omeprazole (PRILOSEC) 20 MG capsule Take 20 mg by mouth daily.    Marland Kitchen telmisartan-hydrochlorothiazide (MICARDIS HCT) 40-12.5 MG tablet TAKE 1 TABLET BY MOUTH ONCE DAILY    . traMADol (ULTRAM) 50 MG tablet TK 1 T PO NIGHTLY PRN FOR UP TO 5 DAYS  0   No current facility-administered medications for this visit.    Facility-Administered Medications Ordered in Other Visits  Medication Dose Route Frequency Provider Last Rate Last Dose  . heparin lock flush 100 unit/mL  500 Units Intravenous Once Forest Gleason, MD      . sodium chloride 0.9 % injection 10 mL  10 mL Intravenous PRN Forest Gleason, MD      . sodium chloride 0.9 % injection 10 mL  10 mL Intravenous PRN Delorise Shiner  Choksi, MD   10 mL at 10/26/14 0940    Review of Systems Review of Systems  Constitutional: Negative.   Respiratory: Negative.   Cardiovascular: Negative.     Blood pressure 122/78, pulse 80, resp. rate 14, height 5\' 4"  (1.626 m), weight 255 lb (115.7 kg).  Physical Exam Physical Exam  Constitutional: She is oriented to person, place, and time. She appears well-developed and well-nourished.  Eyes: Conjunctivae are normal. No scleral icterus.  Neck: Neck supple.  Cardiovascular: Normal rate, regular rhythm and normal heart sounds.   Pulmonary/Chest: Effort normal and breath sounds normal. Right breast exhibits no inverted nipple, no mass, no nipple discharge, no skin change and no tenderness. Left breast exhibits no inverted nipple, no mass, no nipple discharge, no skin change and no tenderness.  Right breast  thickening at 6 o'clock .  Abdominal: Soft. Bowel sounds are normal. There is no tenderness.  Lymphadenopathy:    She has no cervical adenopathy.    She has no axillary adenopathy.  Neurological: She is alert and oriented to person, place, and time.  Skin: Skin is warm and dry.    Data Reviewed Bilateral diagnostic mammograms dated 10/06/2015 completed at [facility] were reviewed. No interval change. [place] indications the site of previous resection of the right breast are noted at the 6:00 position. BI-RADS-2.  Assessment    Benign breast exam.    Plan        Patient will be asked to return to the office in one year with a bilateral diagnotic mammogram.  This information has been scribed by Gaspar Cola CMA.   Robert Bellow 10/11/2015, 8:21 PM

## 2015-10-11 NOTE — Patient Instructions (Addendum)
Patient will be asked to return to the office in one year with a bilateral diagnotic  mammogram. 

## 2015-10-27 ENCOUNTER — Other Ambulatory Visit: Payer: Self-pay | Admitting: *Deleted

## 2015-10-27 DIAGNOSIS — Z853 Personal history of malignant neoplasm of breast: Secondary | ICD-10-CM

## 2015-10-30 ENCOUNTER — Inpatient Hospital Stay: Payer: BLUE CROSS/BLUE SHIELD

## 2015-10-30 ENCOUNTER — Inpatient Hospital Stay: Payer: BLUE CROSS/BLUE SHIELD | Attending: Internal Medicine | Admitting: Internal Medicine

## 2015-10-30 VITALS — BP 133/79 | HR 83 | Temp 96.5°F | Resp 16 | Ht 64.0 in | Wt 256.6 lb

## 2015-10-30 DIAGNOSIS — M25561 Pain in right knee: Secondary | ICD-10-CM | POA: Diagnosis not present

## 2015-10-30 DIAGNOSIS — Z171 Estrogen receptor negative status [ER-]: Secondary | ICD-10-CM

## 2015-10-30 DIAGNOSIS — I10 Essential (primary) hypertension: Secondary | ICD-10-CM | POA: Diagnosis not present

## 2015-10-30 DIAGNOSIS — E785 Hyperlipidemia, unspecified: Secondary | ICD-10-CM | POA: Diagnosis not present

## 2015-10-30 DIAGNOSIS — Z923 Personal history of irradiation: Secondary | ICD-10-CM | POA: Diagnosis not present

## 2015-10-30 DIAGNOSIS — Z79899 Other long term (current) drug therapy: Secondary | ICD-10-CM

## 2015-10-30 DIAGNOSIS — E079 Disorder of thyroid, unspecified: Secondary | ICD-10-CM | POA: Insufficient documentation

## 2015-10-30 DIAGNOSIS — C50311 Malignant neoplasm of lower-inner quadrant of right female breast: Secondary | ICD-10-CM | POA: Diagnosis not present

## 2015-10-30 DIAGNOSIS — Z853 Personal history of malignant neoplasm of breast: Secondary | ICD-10-CM

## 2015-10-30 DIAGNOSIS — Z7984 Long term (current) use of oral hypoglycemic drugs: Secondary | ICD-10-CM | POA: Insufficient documentation

## 2015-10-30 DIAGNOSIS — E119 Type 2 diabetes mellitus without complications: Secondary | ICD-10-CM | POA: Diagnosis not present

## 2015-10-30 DIAGNOSIS — M25562 Pain in left knee: Secondary | ICD-10-CM | POA: Diagnosis not present

## 2015-10-30 DIAGNOSIS — Z9221 Personal history of antineoplastic chemotherapy: Secondary | ICD-10-CM | POA: Diagnosis not present

## 2015-10-30 DIAGNOSIS — J45909 Unspecified asthma, uncomplicated: Secondary | ICD-10-CM | POA: Insufficient documentation

## 2015-10-30 LAB — COMPREHENSIVE METABOLIC PANEL
ALBUMIN: 4.3 g/dL (ref 3.5–5.0)
ALT: 20 U/L (ref 14–54)
AST: 20 U/L (ref 15–41)
Alkaline Phosphatase: 109 U/L (ref 38–126)
Anion gap: 10 (ref 5–15)
BUN: 11 mg/dL (ref 6–20)
CHLORIDE: 103 mmol/L (ref 101–111)
CO2: 24 mmol/L (ref 22–32)
CREATININE: 0.73 mg/dL (ref 0.44–1.00)
Calcium: 9.4 mg/dL (ref 8.9–10.3)
GFR calc non Af Amer: >60 mL/min (ref >60)
GLUCOSE: 133 mg/dL — AB (ref 65–99)
Potassium: 3.6 mmol/L (ref 3.5–5.1)
SODIUM: 137 mmol/L (ref 135–145)
Total Bilirubin: 0.5 mg/dL (ref 0.3–1.2)
Total Protein: 7.4 g/dL (ref 6.5–8.1)

## 2015-10-30 LAB — CBC WITH DIFFERENTIAL/PLATELET
BASOS ABS: 0.1 10*3/uL (ref 0–0.1)
BASOS PCT: 1 %
EOS ABS: 0.2 10*3/uL (ref 0–0.7)
EOS PCT: 2 %
HCT: 40.2 % (ref 35.0–47.0)
Hemoglobin: 14 g/dL (ref 12.0–16.0)
Lymphocytes Relative: 25 %
Lymphs Abs: 2.8 10*3/uL (ref 1.0–3.6)
MCH: 31.5 pg (ref 26.0–34.0)
MCHC: 34.8 g/dL (ref 32.0–36.0)
MCV: 90.6 fL (ref 80.0–100.0)
Monocytes Absolute: 0.6 10*3/uL (ref 0.2–0.9)
Monocytes Relative: 5 %
NEUTROS PCT: 67 %
Neutro Abs: 7.7 10*3/uL — ABNORMAL HIGH (ref 1.4–6.5)
PLATELETS: 349 10*3/uL (ref 150–440)
RBC: 4.43 MIL/uL (ref 3.80–5.20)
RDW: 13.1 % (ref 11.5–14.5)
WBC: 11.4 10*3/uL — AB (ref 3.6–11.0)

## 2015-10-30 LAB — MAGNESIUM: Magnesium: 2 mg/dL (ref 1.7–2.4)

## 2015-10-30 NOTE — Progress Notes (Signed)
RN Chaperoned provider with Breast Exam.   

## 2015-10-30 NOTE — Assessment & Plan Note (Addendum)
Breast cancer stage III triple negative. Clinical trial dose dense Adriamycin followed by Taxol with carboplatin. Clinically no evidence of recurrence. Recent mammogram July 2017 normal.  # CBC normal CMP pending.  # Bil knee pain- s/p cortisone shots.  # Follow-up in 6 months with CBC CMP and tumor markers.

## 2015-10-30 NOTE — Progress Notes (Signed)
[name] OFFICE PROGRESS NOTE  Patient Care Team: Glendon Axe, MD as PCP - General (Internal Medicine) Eusebio Me, MD (Unknown Physician Specialty) Robert Bellow, MD (General Surgery)  No matching staging information was found for the patient.   Oncology History   1. Carcinoma of breast pT1c oN2a Mo  stage  IIIa. extensive lymphovascular invasion 6/14 lymph nodes uninvolved with extra capsular  invasion.Marland Kitchen     2.NEGATIVE FOR BRCA 1  AND 2 MUTATION  estrogen and progesterone receptor negative HER-2/neu receptor negative (triple negative disease) diagnosis in July of 2015.  Status post lumpectomy and axillary node dissection, 2. Started on chemotherapy with NSABP protocol is dose dense Cytoxan Adriamycin August of 2015 3.as per protocol patient was switched  to  carboplatinum and Taxol fromOctober 28, 2015 4.Patient has finished carboplatinum and Taxol in March 25, 2014 Would start radiation therapy to the breast in February of 2016     Malignant neoplasm of lower-inner quadrant of right female breast ([place])   08/09/2013 Initial Diagnosis    Malignant neoplasm of lower-inner quadrant of right female breast [facility])       This is my first interaction with the patient as patient's primary oncologist has been Dr.Choksi. I reviewed the patient's prior charts/pertinent labs/imaging in detail; findings are summarized above.    INTERVAL HISTORY:  Felicia Kidd 55 y.o.  female pleasant patient above history of Stage III triple negative breast cancer currently on surveillance Is here for follow-up  Patient denies any unusual lumps or bumps. Denies any nausea vomiting. Denies any headaches. Denies any chest pain or shortness of breath or cough.   REVIEW OF SYSTEMS:  A complete 10 point review of system is done which is negative except mentioned above/history of present illness.   PAST MEDICAL HISTORY :  Past Medical History:  Diagnosis Date  . Allergy    . Asthma   . Breast cancer ([place])   . Breast cancer of lower-inner quadrant of right female breast ([place]) 08/2013   Triple negative, T1c, N2a.(6/14 nodes +) Treated with Cytoxan and Adriamycin in a dose dense fashion followed by carbotaxol.    . Bronchitis   . Diabetes mellitus without complication ([place])   . Hyperlipidemia   . Hypertension   . Thyroid disease     PAST SURGICAL HISTORY :   Past Surgical History:  Procedure Laterality Date  . BREAST SURGERY Right 09-27-13   Wide excision of Right breast lesion with attempted sentinel node biopsy followed by axillary dissection  . COLONOSCOPY N/A 02/23/2015   Procedure: COLONOSCOPY;  Surgeon: Manya Silvas, MD;  Location: [facility] ENDOSCOPY;  Service: Endoscopy;  Laterality: N/A;   Needs early AM - IDDM  . DILATION AND CURETTAGE OF UTERUS     Dr Vernie Ammons  . PORTACATH PLACEMENT  11-02-13    FAMILY HISTORY :  No family history on file.  SOCIAL HISTORY:   Social History  Substance Use Topics  . Smoking status: Never Smoker  . Smokeless tobacco: Never Used  . Alcohol use No    ALLERGIES:  is allergic to other.  MEDICATIONS:  Current Outpatient Prescriptions  Medication Sig Dispense Refill  . acetaminophen (TYLENOL) 325 MG tablet Take 650 mg by mouth every 6 (six) hours as needed.    Marland Kitchen atorvastatin (LIPITOR) 40 MG tablet Take 40 mg by mouth daily.    . celecoxib (CELEBREX) 200 MG capsule TK 1 C PO QD  2  . fluticasone (FLONASE) 50  MCG/ACT nasal spray Place 2 sprays into both nostrils daily.    . LUTEIN-ZEAXANTHIN PO Take by mouth.    . Magnesium 250 MG TABS Take by mouth.    . meloxicam (MOBIC) 7.5 MG tablet TAKE 1 TO 2 TABLETS BY MOUTH DAILY AS NEEDED    . metFORMIN (GLUCOPHAGE) 500 MG tablet TAKE 1 TABLET(500 MG) BY MOUTH TWICE DAILY    . Multiple Vitamins-Minerals (MULTIVITAMIN WITH MINERALS) tablet Take 1 tablet by mouth daily.    . Omega-3 Fatty Acids (FISH OIL PO) Take by mouth.    Marland Kitchen omeprazole (PRILOSEC) 20 MG capsule Take  20 mg by mouth daily.    Marland Kitchen telmisartan-hydrochlorothiazide (MICARDIS HCT) 40-12.5 MG tablet TAKE 1 TABLET BY MOUTH ONCE DAILY     No current facility-administered medications for this visit.    Facility-Administered Medications Ordered in Other Visits  Medication Dose Route Frequency Provider Last Rate Last Dose  . heparin lock flush 100 unit/mL  500 Units Intravenous Once Forest Gleason, MD      . sodium chloride 0.9 % injection 10 mL  10 mL Intravenous PRN Forest Gleason, MD      . sodium chloride 0.9 % injection 10 mL  10 mL Intravenous PRN Forest Gleason, MD   10 mL at 10/26/14 0940    PHYSICAL EXAMINATION: ECOG PERFORMANCE STATUS: 0 - Asymptomatic  BP 133/79 (BP Location: Left Arm, Patient Position: Sitting)   Pulse 83   Temp (!) 96.5 F (35.8 C) (Tympanic)   Resp 16   Ht 5' 4" (1.626 m)   Wt 256 lb 9.6 oz (116.4 kg)   BMI 44.05 kg/m   Filed Weights   10/30/15 1528  Weight: 256 lb 9.6 oz (116.4 kg)    GENERAL: Well-nourished well-developed; Alert, no distress and comfortable.   Alone EYES: no pallor or icterus OROPHARYNX: no thrush or ulceration; good dentition  NECK: supple, no masses felt LYMPH:  no palpable lymphadenopathy in the cervical, axillary or inguinal regions LUNGS: clear to auscultation and  No wheeze or crackles HEART/CVS: regular rate & rhythm and no murmurs; No lower extremity edema ABDOMEN:abdomen soft, non-tender and normal bowel sounds Musculoskeletal:no cyanosis of digits and no clubbing  PSYCH: alert & oriented x 3 with fluent speech NEURO: no focal motor/sensory deficits SKIN:  no rashes or significant lesions .Right and left BREAST exam [in the presence of nurse]- no unusual skin changes or dominant masses felt. Surgical scars noted.      LABORATORY DATA:  I have reviewed the data as listed    Component Value Date/Time   NA 137 10/30/2015 1457   NA 138 04/06/2014 1328   K 3.6 10/30/2015 1457   K 4.1 04/06/2014 1328   CL 103 10/30/2015 1457    CL 101 04/06/2014 1328   CO2 24 10/30/2015 1457   CO2 30 04/06/2014 1328   GLUCOSE 133 (H) 10/30/2015 1457   GLUCOSE 105 (H) 04/06/2014 1328   BUN 11 10/30/2015 1457   BUN 8 04/06/2014 1328   CREATININE 0.73 10/30/2015 1457   CREATININE 0.62 04/06/2014 1328   CALCIUM 9.4 10/30/2015 1457   CALCIUM 8.5 04/06/2014 1328   PROT 7.4 10/30/2015 1457   PROT 6.7 04/06/2014 1328   ALBUMIN 4.3 10/30/2015 1457   ALBUMIN 3.3 (L) 04/06/2014 1328   AST 20 10/30/2015 1457   AST 18 04/06/2014 1328   ALT 20 10/30/2015 1457   ALT 32 04/06/2014 1328   ALKPHOS 109 10/30/2015 1457   ALKPHOS 118 (H)  04/06/2014 1328   BILITOT 0.5 10/30/2015 1457   BILITOT 0.4 04/06/2014 1328   GFRNONAA >60 10/30/2015 1457   GFRNONAA >60 04/06/2014 1328   GFRNONAA >60 12/01/2013 0926   GFRAA >60 10/30/2015 1457   GFRAA >60 04/06/2014 1328   GFRAA >60 12/01/2013 0926    No results found for: SPEP, UPEP  Lab Results  Component Value Date   WBC 11.4 (H) 10/30/2015   NEUTROABS 7.7 (H) 10/30/2015   HGB 14.0 10/30/2015   HCT 40.2 10/30/2015   MCV 90.6 10/30/2015   PLT 349 10/30/2015      Chemistry      Component Value Date/Time   NA 137 10/30/2015 1457   NA 138 04/06/2014 1328   K 3.6 10/30/2015 1457   K 4.1 04/06/2014 1328   CL 103 10/30/2015 1457   CL 101 04/06/2014 1328   CO2 24 10/30/2015 1457   CO2 30 04/06/2014 1328   BUN 11 10/30/2015 1457   BUN 8 04/06/2014 1328   CREATININE 0.73 10/30/2015 1457   CREATININE 0.62 04/06/2014 1328      Component Value Date/Time   CALCIUM 9.4 10/30/2015 1457   CALCIUM 8.5 04/06/2014 1328   ALKPHOS 109 10/30/2015 1457   ALKPHOS 118 (H) 04/06/2014 1328   AST 20 10/30/2015 1457   AST 18 04/06/2014 1328   ALT 20 10/30/2015 1457   ALT 32 04/06/2014 1328   BILITOT 0.5 10/30/2015 1457   BILITOT 0.4 04/06/2014 1328       RADIOGRAPHIC STUDIES: I have personally reviewed the radiological images as listed and agreed with the findings in the report. No  results found.   ASSESSMENT & PLAN:  Malignant neoplasm of lower-inner quadrant of right female breast ([place]) Breast cancer stage III triple negative. Clinical trial dose dense Adriamycin followed by Taxol with carboplatin. Clinically no evidence of recurrence. Recent mammogram July 2017 normal.  # CBC normal CMP pending.  # Bil knee pain- s/p cortisone shots.  # Follow-up in 6 months with CBC CMP and tumor markers.    Orders Placed This Encounter  Procedures  . CBC with Differential    Standing Status:   Future    Standing Expiration Date:   10/29/2016  . Comprehensive metabolic panel    Standing Status:   Future    Standing Expiration Date:   10/29/2016  . Cancer antigen 27.29    Standing Status:   Future    Standing Expiration Date:   10/29/2016   All questions were answered. The patient knows to call the clinic with any problems, questions or concerns.      Cammie Sickle, MD 10/30/2015 4:08 PM

## 2015-10-30 NOTE — Progress Notes (Signed)
No concerns since last visit. Mammo was ordered by [employer]

## 2016-01-24 ENCOUNTER — Telehealth: Payer: Self-pay | Admitting: Obstetrics and Gynecology

## 2016-01-24 NOTE — Telephone Encounter (Signed)
PT WENT TO URGENT CARE FOR BLADDER INF  AND WAS GIVEN ANTIBIOTIC (NIPROFURANTOIN), THEN FELT LIKE SHE WAS GETTING A YEAST INF AND WAS GIVEN DIFLUCAN. SHE GOT HER FLU SHOT TOO. SHE IS FEELING ACHY AND STILL HAVING SYMPTOMS OF BLADDER INF

## 2016-01-24 NOTE — Telephone Encounter (Signed)
Pt states she recently was treated for a uti. U/c neg for infection. Pt feels very raw down there. She is burning at times. Does not know it there is any redness. Feels a little better today. Advised cranberry tablets for urinary health. A&d ointment or destin for her bottom. Pt preferred to be seen. Appt made for 11/16 at 8am.

## 2016-01-25 ENCOUNTER — Encounter: Payer: Self-pay | Admitting: Obstetrics and Gynecology

## 2016-01-25 ENCOUNTER — Ambulatory Visit (INDEPENDENT_AMBULATORY_CARE_PROVIDER_SITE_OTHER): Payer: BLUE CROSS/BLUE SHIELD | Admitting: Obstetrics and Gynecology

## 2016-01-25 VITALS — BP 138/79 | HR 91 | Ht 64.0 in | Wt 250.5 lb

## 2016-01-25 DIAGNOSIS — N9489 Other specified conditions associated with female genital organs and menstrual cycle: Secondary | ICD-10-CM

## 2016-01-25 DIAGNOSIS — N905 Atrophy of vulva: Secondary | ICD-10-CM | POA: Insufficient documentation

## 2016-01-25 DIAGNOSIS — Z780 Asymptomatic menopausal state: Secondary | ICD-10-CM

## 2016-01-25 MED ORDER — FLUCONAZOLE 150 MG PO TABS
150.0000 mg | ORAL_TABLET | Freq: Once | ORAL | 3 refills | Status: AC
Start: 2016-01-25 — End: 2016-01-25

## 2016-01-25 NOTE — Addendum Note (Signed)
Addended by: Elouise Munroe on: 01/25/2016 10:41 AM   Modules accepted: Orders

## 2016-01-25 NOTE — Progress Notes (Signed)
GYN ENCOUNTER NOTE  Subjective:       Felicia Kidd is a 55 y.o. G1P1 female is here for gynecologic evaluation of the following issues:  1.several days of vulvar burning. No significant discharge. Patient is not  No vaginal bleeding. Patient is not on any HRT because of history of breast cancer   Obstetric History OB History  Gravida Para Term Preterm AB Living  1 1          SAB TAB Ectopic Multiple Live Births               # Outcome Date GA Lbr Len/2nd Weight Sex Delivery Anes PTL Lv  1 Para             Obstetric Comments  1st Menstrual Cycle:  15  1st Pregnancy:  19    Past Medical History:  Diagnosis Date  . Allergy   . Asthma   . Breast cancer ([place])   . Breast cancer of lower-inner quadrant of right female breast ([place]) 08/2013   Triple negative, T1c, N2a.(6/14 nodes +) Treated with Cytoxan and Adriamycin in a dose dense fashion followed by carbotaxol.    . Bronchitis   . Diabetes mellitus without complication ([place])   . Hyperlipidemia   . Hypertension   . Thyroid disease     Past Surgical History:  Procedure Laterality Date  . BREAST SURGERY Right 09-27-13   Wide excision of Right breast lesion with attempted sentinel node biopsy followed by axillary dissection  . COLONOSCOPY N/A 02/23/2015   Procedure: COLONOSCOPY;  Surgeon: Manya Silvas, MD;  Location: [facility] ENDOSCOPY;  Service: Endoscopy;  Laterality: N/A;   Needs early AM - IDDM  . DILATION AND CURETTAGE OF UTERUS     Dr Vernie Ammons  . PORTACATH PLACEMENT  11-02-13    Current Outpatient Prescriptions on File Prior to Visit  Medication Sig Dispense Refill  . acetaminophen (TYLENOL) 325 MG tablet Take 650 mg by mouth every 6 (six) hours as needed.    Marland Kitchen atorvastatin (LIPITOR) 40 MG tablet Take 40 mg by mouth daily.    . celecoxib (CELEBREX) 200 MG capsule TK 1 C PO QD  2  . fluticasone (FLONASE) 50 MCG/ACT nasal spray Place 2 sprays into both nostrils daily.    . LUTEIN-ZEAXANTHIN PO Take by mouth.    .  Magnesium 250 MG TABS Take by mouth.    . metFORMIN (GLUCOPHAGE) 500 MG tablet TAKE 1 TABLET(500 MG) BY MOUTH TWICE DAILY    . Multiple Vitamins-Minerals (MULTIVITAMIN WITH MINERALS) tablet Take 1 tablet by mouth daily.    . Omega-3 Fatty Acids (FISH OIL PO) Take by mouth.    Marland Kitchen omeprazole (PRILOSEC) 20 MG capsule Take 20 mg by mouth daily.    Marland Kitchen telmisartan-hydrochlorothiazide (MICARDIS HCT) 40-12.5 MG tablet TAKE 1 TABLET BY MOUTH ONCE DAILY     Current Facility-Administered Medications on File Prior to Visit  Medication Dose Route Frequency Provider Last Rate Last Dose  . heparin lock flush 100 unit/mL  500 Units Intravenous Once Forest Gleason, MD      . sodium chloride 0.9 % injection 10 mL  10 mL Intravenous PRN Forest Gleason, MD      . sodium chloride 0.9 % injection 10 mL  10 mL Intravenous PRN Forest Gleason, MD   10 mL at 10/26/14 0940    Allergies  Allergen Reactions  . Other Rash and Swelling    PNEUMOVAX.    Social History   Social  History  . Marital status: Widowed    Spouse name: N/A  . Number of children: N/A  . Years of education: N/A   Occupational History  . Not on file.   Social History Main Topics  . Smoking status: Never Smoker  . Smokeless tobacco: Never Used  . Alcohol use No  . Drug use: No  . Sexual activity: No   Other Topics Concern  . Not on file   Social History Narrative  . No narrative on file    Family History  Problem Relation Age of Onset  . Diabetes Mother   . Breast cancer Neg Hx   . Ovarian cancer Neg Hx   . Colon cancer Neg Hx     The following portions of the patient's history were reviewed and updated as appropriate: allergies, current medications, past family history, past medical history, past social history, past surgical history and problem list.  Review of Systems Review of Systems - per history of present illness  Objective:   BP 138/79   Pulse 91   Ht 5\' 4"  (1.626 m)   Wt 250 lb 8 oz (113.6 kg)   BMI 43.00 kg/m   CONSTITUTIONAL: Well-developed, well-nourished female in no acute distress.  HENT:  Normocephalic, atraumatic.  NECK: not examined SKIN: Skin is warm and dry. No rash noted. Not diaphoretic. No erythema. No pallor. [place]: Alert and oriented to person, place, and time. PSYCHIATRIC: Normal mood and affect. Normal behavior. Normal judgment and thought content. CARDIOVASCULAR:Not Examined RESPIRATORY: Not Examined BREASTS: Not Examined ABDOMEN: Soft, non distended; Non tender.  No Organomegaly. PELVIC:  External Genitalia: Several Brownperineal macules on gluteus;atrophic changes at the posterior fourchette without erosion (area of tenderness)  BUS: Normal  Vagina: moderate to severe atrophy; minimal secretions and vaginal vault, white  Cervix: not fully visualized  Uterus: not examined  Adnexa:not examined  RV: Normal normal external exam  Bladder: Nontender MUSCULOSKELETAL: not examined  PROCEDURE: Wet prep Normal saline-negative KOH-negative   Assessment:   1. Menopause  2. Vulvar atrophy  3. Vulvar burning     Plan:   1. Wet prep-negative 2. Nu swab 3. Diflucan 150 mg orally times to see 4. Return in 2 weeks for follow-up and possible vulvar biopsy 5. Recommend Dove soap  A total of 15 minutes were spent face-to-face with the patient during this encounter and over half of that time dealt with counseling and coordination of care.  Brayton Mars, MD  Note: This dictation was prepared with Dragon dictation along with smaller phrase technology. Any transcriptional errors that result from this process are unintentional.

## 2016-01-25 NOTE — Patient Instructions (Addendum)
1. Take Diflucan 150 mg orally 1 dose 2.return in 2 weeks for follow-up and possible vulvar biopsy 3. Recommended DOVE soap

## 2016-01-29 ENCOUNTER — Telehealth: Payer: Self-pay | Admitting: Obstetrics and Gynecology

## 2016-01-29 NOTE — Telephone Encounter (Signed)
PT CALLED AND SHE WAS HERE LAST Thursday 11/16 AND DR DE IS TREATING HER FOR A YEAST INFECTION DUE TO HER RAW AND BURNING ON THE LABIA, HE GAVE HER A FLUCONAZOLE 150MG  TABLET, SHE TOOK IT ON Thursday, THERE IS A REFILL ON IT AND SHE IS STILL HAVING PAIN AND DISCOMFORT AND ACHS, SHE IS COMING IN FOR A F/U IN A WEEK, BUT SHE WANTED TO KNOW IF SHE NEEDS TO TAKE THE REFILL OR SHOULD SHE DO SOMETHING ELSE.

## 2016-01-30 LAB — NUSWAB BV AND CANDIDA, NAA
CANDIDA ALBICANS, NAA: NEGATIVE
Candida glabrata, NAA: NEGATIVE

## 2016-01-30 NOTE — Telephone Encounter (Signed)
Pt informed of results of nuswab: negative for BV and yeast. She states the Diflucan helped but continues with discomfort. I told her that she could take the Diflucan if it helped but yeast was not found on lab results. Also she is taking the cranberry pills and that was fine to continue. Pt. To use A&D on labia for discomfort and that a vulvar bx may be indicated on her next visit as he had explained.

## 2016-02-05 ENCOUNTER — Encounter: Payer: Self-pay | Admitting: Radiation Oncology

## 2016-02-05 ENCOUNTER — Ambulatory Visit
Admission: RE | Admit: 2016-02-05 | Discharge: 2016-02-05 | Disposition: A | Discharge status: Home / Self Care | Payer: BLUE CROSS/BLUE SHIELD | Source: Ambulatory Visit | Attending: Radiation Oncology | Admitting: Radiation Oncology

## 2016-02-05 VITALS — BP 157/89 | HR 91 | Temp 96.9°F | Wt 250.9 lb

## 2016-02-05 DIAGNOSIS — Z923 Personal history of irradiation: Secondary | ICD-10-CM | POA: Diagnosis not present

## 2016-02-05 DIAGNOSIS — Z9221 Personal history of antineoplastic chemotherapy: Secondary | ICD-10-CM | POA: Insufficient documentation

## 2016-02-05 DIAGNOSIS — C50311 Malignant neoplasm of lower-inner quadrant of right female breast: Secondary | ICD-10-CM

## 2016-02-05 DIAGNOSIS — Z853 Personal history of malignant neoplasm of breast: Secondary | ICD-10-CM | POA: Insufficient documentation

## 2016-02-05 NOTE — Progress Notes (Signed)
Radiation Oncology Follow up Note  Name: Felicia Kidd   Date:   02/05/2016 MRN:  [id number] DOB: [birth year]    This 55 y.o. female presents to the clinic today for 1-1/2 year follow-up for stage IIIa breast cancer.Marland Kitchen  REFERRING PROVIDER: Glendon Axe, MD  HPI: Patient is a 55 year old female now out over a year and a half having completed radiation therapy. To her right breast for a stage IIIa (T1 cN2 a M0) invasive mammary carcinoma status post dose dense neoadjuvant chemotherapy with 6 of 14 lymph nodes positive for metastatic disease with extracapsular extension and follow-up right breast and peripheral lymphatic radiation. She is seen today in routine follow-up and is doing well. Tumor was triple negative. She is seen today in routine follow-up and is doing well she specifically denies cough bone pain or any new areas of nodularity in the breast. She also has no evidence of lymphedema of her right upper extremity.  COMPLICATIONS OF TREATMENT: none  FOLLOW UP COMPLIANCE: keeps appointments   PHYSICAL EXAM:  BP (!) 157/89   Pulse 91   Temp (!) 96.9 F (36.1 C)   Wt 250 lb 14.1 oz (113.8 kg)   BMI 43.06 kg/m  Lungs are clear to A&P cardiac examination essentially unremarkable with regular rate and rhythm. No dominant mass or nodularity is noted in either breast in 2 positions examined. Incision is well-healed. No axillary or supraclavicular adenopathy is appreciated. Cosmetic result is excellent. No Well-developed well-nourished patient in NAD. HEENT reveals PERLA, EOMI, discs not visualized.  Oral cavity is clear. No oral mucosal lesions are identified. Neck is clear without evidence of cervical or supraclavicular adenopathy. Lungs are clear to A&P. Cardiac examination is essentially unremarkable with regular rate and rhythm without murmur rub or thrill. Abdomen is benign with no organomegaly or masses noted. Motor sensory and DTR levels are equal and symmetric in the upper and lower  extremities. Cranial nerves II through XII are grossly intact. Proprioception is intact. No peripheral adenopathy or edema is identified. No motor or sensory levels are noted. Crude visual fields are within normal range.  RADIOLOGY RESULTS: Most recent mammogram reviewed and compatible with the above-stated findings showing no evidence of disease  PLAN: Present time she continues to do well with no evidence of disease. I've asked to see her back in 1 year for follow-up. She or he has follow-up mammograms ordered. She is not on antiestrogen therapy based on the triple negative nature of her disease. Patient is to call with any concerns.  I would like to take this opportunity to thank you for allowing me to participate in the care of your patient.Armstead Peaks., MD

## 2016-02-06 ENCOUNTER — Encounter: Payer: Self-pay | Admitting: Obstetrics and Gynecology

## 2016-02-06 ENCOUNTER — Ambulatory Visit (INDEPENDENT_AMBULATORY_CARE_PROVIDER_SITE_OTHER): Payer: BLUE CROSS/BLUE SHIELD | Admitting: Obstetrics and Gynecology

## 2016-02-06 VITALS — BP 152/77 | HR 103 | Ht 63.0 in | Wt 250.8 lb

## 2016-02-06 DIAGNOSIS — N763 Subacute and chronic vulvitis: Secondary | ICD-10-CM

## 2016-02-06 DIAGNOSIS — N905 Atrophy of vulva: Secondary | ICD-10-CM | POA: Diagnosis not present

## 2016-02-06 DIAGNOSIS — N9489 Other specified conditions associated with female genital organs and menstrual cycle: Secondary | ICD-10-CM | POA: Diagnosis not present

## 2016-02-06 NOTE — Addendum Note (Signed)
Addended by: Elouise Munroe on: 02/06/2016 10:22 AM   Modules accepted: Orders

## 2016-02-06 NOTE — Progress Notes (Signed)
Chief complaint: 1.Vulvar burning  Patient presents for vulvar biopsy. She has been treated 1 week ago with Diflucan without change in symptomatology. Vulvar burning and ache persists.  OBJECTIVE: BP (!) 152/77   Pulse (!) 103   Ht 5\' 3"  (1.6 m)   Wt 250 lb 12.8 oz (113.8 kg)   BMI 44.43 kg/m  Pleasant female in no acute distress Pelvic exam: External genitalia-atrophic change without erosions   PROCEDURE:Vulvar biopsy Verbal consent is obtained. Patient is placed in dorsal lithotomy position. Perineum is prepped with Betadine. 3 cc of 1% lidocaine is  Injected for anesthesia. 3.5 mm punch biopsy is taken. Tissue specimen Is sent to pathology.3-0 Vicryl Rapide suture is used for hemostasis. Blood loss-minimal. Complications none.  ASSESSMENT: 1. Vulvar burning 2. Nu swab negative for Candida And BV  PLAN: 1. Vulvar biopsy 2. Return in 2 weeks for follow-up 3. Post procedure instructions given  Brayton Mars, MD  Note: This dictation was prepared with Dragon dictation along with smaller phrase technology. Any transcriptional errors that result from this process are unintentional.

## 2016-02-06 NOTE — Patient Instructions (Signed)

## 2016-02-08 ENCOUNTER — Encounter: Payer: BLUE CROSS/BLUE SHIELD | Admitting: Obstetrics and Gynecology

## 2016-02-08 LAB — PATHOLOGY

## 2016-02-15 ENCOUNTER — Encounter: Payer: BLUE CROSS/BLUE SHIELD | Admitting: Obstetrics and Gynecology

## 2016-02-20 ENCOUNTER — Ambulatory Visit (INDEPENDENT_AMBULATORY_CARE_PROVIDER_SITE_OTHER): Payer: BLUE CROSS/BLUE SHIELD | Admitting: Obstetrics and Gynecology

## 2016-02-20 ENCOUNTER — Encounter: Payer: Self-pay | Admitting: Obstetrics and Gynecology

## 2016-02-20 VITALS — BP 141/84 | HR 88 | Ht 63.0 in | Wt 250.6 lb

## 2016-02-20 DIAGNOSIS — L900 Lichen sclerosus et atrophicus: Secondary | ICD-10-CM | POA: Diagnosis not present

## 2016-02-20 MED ORDER — CLOBETASOL PROPIONATE 0.05 % EX OINT: 1.0000 "application " | TOPICAL_OINTMENT | Freq: Two times a day (BID) | CUTANEOUS | 0 refills | Status: DC

## 2016-02-20 NOTE — Progress Notes (Signed)
Chief complaint: 1. 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 Patient was treated with oral steroids for an orthopedic condition and has noted some improvement in her vulvar itching   Pathology: Lichen sclerosus  OBJECTIVE: BP (!) 141/84   Pulse 88   Ht 5\' 3"  (1.6 m)   Wt 250 lb 9.6 oz (113.7 kg)   BMI 44.39 kg/m  Physical exam: Pelvic: External genitalia-atrophic changes without erosions; vulvar biopsy site is well-healed; residual suture is removed.  ASSESSMENT: 1. Lichen sclerosus  PLAN: 1. Temovate ointment 0.05% topically to the vulva twice a day for 2 weeks, then once a day for 4 weeks 2. Return in 6 weeks for follow-up and annual exam  A total of 15 minutes were spent face-to-face with the patient during this encounter and over half of that time dealt with counseling and coordination of care.  Brayton Mars, MD  Note: This dictation was prepared with Dragon dictation along with smaller phrase technology. Any transcriptional errors that result from this process are unintentional.

## 2016-02-20 NOTE — Patient Instructions (Signed)
1. Apply Temovate ointment twice a day for 2 weeks, then once a day for 4 weeks 2. Return in 6 weeks for annual exam and follow-up on lichen sclerosus   Lichen Sclerosus Introduction Lichen sclerosus is a skin problem. It can happen on any part of the body, but it commonly involves the anal or genital areas. It can cause itching and discomfort in these areas. Treatment can help to control symptoms. When the genital area is affected, getting treatment is important because the condition can cause scarring that may lead to other problems. What are the causes? The cause of this condition is not known. It could be the result of an overactive immune system or a lack of certain hormones. Lichen sclerosus is not an infection or a fungus. It is not passed from one person to another (not contagious). What increases the risk? This condition is more likely to develop in women, usually after menopause. What are the signs or symptoms? Symptoms of this condition include:  Thin, wrinkled, white areas on the skin.  Thickened white areas on the skin.  Red and swollen patches (lesions) on the skin.  Tears or cracks in the skin.  Bruising.  Blood blisters.  Severe itching. You may also have pain, itching, or burning with urination. Constipation is also common in people with lichen sclerosus. How is this diagnosed? This condition may be diagnosed with a physical exam. In some cases, a tissue sample (biopsy sample) may be removed to be looked at under a microscope. How is this treated? This condition is usually treated with medicated creams or ointments (topical steroids) that are applied over the affected areas. Follow these instructions at home:  Take over-the-counter and prescription medicines only as told by your health care provider.  Use creams or ointments as told by your health care provider.  Do not scratch the affected areas of skin.  Women should keep the vaginal area as clean and dry as  possible.  Keep all follow-up visits as told by your health care provider. This is important. Contact a health care provider if:  You have increasing redness, swelling, or pain in the affected area.  You have fluid, blood, or pus coming from the affected area.  You have new lesions on your skin.  You have a fever.  You have pain during sex. This information is not intended to replace advice given to you by your health care provider. Make sure you discuss any questions you have with your health care provider. Document Released: 07/18/2010 Document Revised: 08/03/2015 Document Reviewed: 05/23/2014  2017 [employer]

## 2016-04-03 ENCOUNTER — Encounter: Payer: BLUE CROSS/BLUE SHIELD | Admitting: Obstetrics and Gynecology

## 2016-04-11 ENCOUNTER — Ambulatory Visit (INDEPENDENT_AMBULATORY_CARE_PROVIDER_SITE_OTHER): Payer: BLUE CROSS/BLUE SHIELD | Admitting: Obstetrics and Gynecology

## 2016-04-11 ENCOUNTER — Encounter: Payer: Self-pay | Admitting: Obstetrics and Gynecology

## 2016-04-11 VITALS — BP 152/83 | HR 93 | Ht 63.0 in | Wt 253.8 lb

## 2016-04-11 DIAGNOSIS — Z01419 Encounter for gynecological examination (general) (routine) without abnormal findings: Secondary | ICD-10-CM

## 2016-04-11 DIAGNOSIS — C50311 Malignant neoplasm of lower-inner quadrant of right female breast: Secondary | ICD-10-CM

## 2016-04-11 DIAGNOSIS — I10 Essential (primary) hypertension: Secondary | ICD-10-CM | POA: Diagnosis not present

## 2016-04-11 DIAGNOSIS — N905 Atrophy of vulva: Secondary | ICD-10-CM | POA: Diagnosis not present

## 2016-04-11 DIAGNOSIS — L900 Lichen sclerosus et atrophicus: Secondary | ICD-10-CM | POA: Diagnosis not present

## 2016-04-11 DIAGNOSIS — Z780 Asymptomatic menopausal state: Secondary | ICD-10-CM

## 2016-04-11 NOTE — Progress Notes (Signed)
ANNUAL PREVENTATIVE CARE GYN  ENCOUNTER NOTE  Subjective:       Felicia Kidd is a 56 y.o. G1P1 female here for a routine annual gynecologic exam.  Current complaints: 1.  None 2. Breast CA 2015 3. History of LS&A  No vulvar irritative symptoms No changes in bowel or bladder function Currently not sexually active; last Pap smear negative History of breast cancer; status post lumpectomy followed by chemotherapy and radiation-2000    Gynecologic History No LMP recorded. Patient is postmenopausal. Contraception: post menopausal status Last Pap: 02/2015. Results were: normal Last mammogram: 10/2015 . Results were: normal  Obstetric History OB History  Gravida Para Term Preterm AB Living  1 1          SAB TAB Ectopic Multiple Live Births               # Outcome Date GA Lbr Len/2nd Weight Sex Delivery Anes PTL Lv  1 Para             Obstetric Comments  1st Menstrual Cycle:  15  1st Pregnancy:  19    Past Medical History:  Diagnosis Date  . Allergy   . Asthma   . Breast cancer ([place])   . Breast cancer of lower-inner quadrant of right female breast ([place]) 08/2013   Triple negative, T1c, N2a.(6/14 nodes +) Treated with Cytoxan and Adriamycin in a dose dense fashion followed by carbotaxol.    . Bronchitis   . Diabetes mellitus without complication ([place])   . Hyperlipidemia   . Hypertension   . Thyroid disease     Past Surgical History:  Procedure Laterality Date  . BREAST SURGERY Right 09-27-13   Wide excision of Right breast lesion with attempted sentinel node biopsy followed by axillary dissection  . COLONOSCOPY N/A 02/23/2015   Procedure: COLONOSCOPY;  Surgeon: Manya Silvas, MD;  Location: [facility] ENDOSCOPY;  Service: Endoscopy;  Laterality: N/A;   Needs early AM - IDDM  . DILATION AND CURETTAGE OF UTERUS     Dr Vernie Ammons  . PORTACATH PLACEMENT  11-02-13    Current Outpatient Prescriptions on File Prior to Visit  Medication Sig Dispense Refill  . acetaminophen  (TYLENOL) 325 MG tablet Take 650 mg by mouth every 6 (six) hours as needed.    Marland Kitchen atorvastatin (LIPITOR) 40 MG tablet Take 40 mg by mouth daily.    . celecoxib (CELEBREX) 200 MG capsule TK 1 C PO QD  2  . clobetasol ointment (TEMOVATE) AB-[id number] % Apply 1 application topically 2 (two) times daily. 30 g 0  . fluticasone (FLONASE) 50 MCG/ACT nasal spray Place 2 sprays into both nostrils daily.    . LUTEIN-ZEAXANTHIN PO Take by mouth.    . Magnesium 250 MG TABS Take by mouth.    . metFORMIN (GLUCOPHAGE) 500 MG tablet TAKE 1 TABLET(500 MG) BY MOUTH TWICE DAILY    . Multiple Vitamins-Minerals (MULTIVITAMIN WITH MINERALS) tablet Take 1 tablet by mouth daily.    . Omega-3 Fatty Acids (FISH OIL PO) Take by mouth.    Marland Kitchen omeprazole (PRILOSEC) 20 MG capsule Take 20 mg by mouth daily.    Marland Kitchen telmisartan-hydrochlorothiazide (MICARDIS HCT) 40-12.5 MG tablet TAKE 1 TABLET BY MOUTH ONCE DAILY     Current Facility-Administered Medications on File Prior to Visit  Medication Dose Route Frequency Provider Last Rate Last Dose  . heparin lock flush 100 unit/mL  500 Units Intravenous Once Forest Gleason, MD      . sodium chloride 0.9 %  injection 10 mL  10 mL Intravenous PRN Forest Gleason, MD      . sodium chloride 0.9 % injection 10 mL  10 mL Intravenous PRN Forest Gleason, MD   10 mL at 10/26/14 0940    Allergies  Allergen Reactions  . Other Rash and Swelling    PNEUMOVAX.    Social History   Social History  . Marital status: Widowed    Spouse name: N/A  . Number of children: N/A  . Years of education: N/A   Occupational History  . Not on file.   Social History Main Topics  . Smoking status: Never Smoker  . Smokeless tobacco: Never Used  . Alcohol use No  . Drug use: No  . Sexual activity: No   Other Topics Concern  . Not on file   Social History Narrative  . No narrative on file    Family History  Problem Relation Age of Onset  . Diabetes Mother   . Breast cancer Neg Hx   . Ovarian cancer Neg Hx    . Colon cancer Neg Hx     The following portions of the patient's history were reviewed and updated as appropriate: allergies, current medications, past family history, past medical history, past social history, past surgical history and problem list.  Review of Systems ROS Review of Systems - General ROS: negative for - chills, fatigue, fever, hot flashes, night sweats, weight gain or weight loss Psychological ROS: negative for - anxiety, decreased libido, depression, mood swings, physical abuse or sexual abuse Ophthalmic ROS: negative for - blurry vision, eye pain or loss of vision ENT ROS: negative for - headaches, hearing change, visual changes or vocal changes Allergy and Immunology ROS: negative for - hives, itchy/watery eyes or seasonal allergies Hematological and Lymphatic ROS: negative for - bleeding problems, bruising, swollen lymph nodes or weight loss Endocrine ROS: negative for - galactorrhea, hair pattern changes, hot flashes, malaise/lethargy, mood swings, palpitations, polydipsia/polyuria, skin changes, temperature intolerance or unexpected weight changes Breast ROS: negative for - new or changing breast lumps or nipple discharge Respiratory ROS: negative for - cough or shortness of breath Cardiovascular ROS: negative for - chest pain, irregular heartbeat, palpitations or shortness of breath Gastrointestinal ROS: no abdominal pain, change in bowel habits, or black or bloody stools Genito-Urinary ROS: no dysuria, trouble voiding, or hematuria Musculoskeletal ROS: negative for - joint pain or joint stiffness Neurological ROS: negative for - bowel and bladder control changes Dermatological ROS: negative for rash and skin lesion changes   Objective:   BP (!) 152/83   Pulse 93   Ht 5\' 3"  (1.6 m)   Wt 253 lb 12.8 oz (115.1 kg)   BMI 44.96 kg/m  CONSTITUTIONAL: Well-developed, well-nourished female in no acute distress.  PSYCHIATRIC: Normal mood and affect. Normal behavior.  Normal judgment and thought content. [place]: Alert and oriented to person, place, and time. Normal muscle tone coordination. No cranial nerve deficit noted. HENT:  Normocephalic, atraumatic, External right and left ear normal. Oropharynx is clear and moist EYES: Conjunctivae and EOM are normal. No scleral icterus.  NECK: Normal range of motion, supple, no masses.  Normal thyroid.  SKIN: Skin is warm and dry. No rash noted. Not diaphoretic. No erythema. No pallor. CARDIOVASCULAR: Normal heart rate noted, regular rhythm, no murmur. RESPIRATORY: Clear to auscultation bilaterally. Effort and breath sounds normal, no problems with respiration noted. BREASTS: Not examined ABDOMEN: Soft, normal bowel sounds, no distention noted.  No tenderness, rebound or guarding. Moderate  pannus BLADDER: Normal PELVIC:  External Genitalia: Atrophic changes with the labia minora to the labia majora; no leukoplakia; no perineal skin breakdown  BUS: Normal  Vagina: Atrophic changes with decreased rugate and pale mucosa  Cervix: Normal; no lesions; no cervical motion tenderness  Uterus nonpalpable due to body habitus  Adnexa: Nonpalpable and nontender  RV: External Exam NormaI, No Rectal Masses and Normal Sphincter tone  MUSCULOSKELETAL: Normal range of motion. No tenderness.  No cyanosis, clubbing, or edema.  2+ distal pulses. LYMPHATIC: No Axillary, Supraclavicular, or Inguinal Adenopathy.    Assessment:   Annual gynecologic examination 56 y.o. Contraception: post menopausal status Obesity 2 History of breast [id number] History of lichen sclerosus, asymptomatic after 6 weeks of therapy Plan:  Pap: Pap Co Test Mammogram: thru dr brynett Stool Guaiac Testing:  09/2015- wnl Labs: thru pcp Routine preventative health maintenance measures emphasized: Exercise/Diet/Weight control, Tobacco Warnings and Alcohol/Substance use risks  Recommend continuing Temovate ointment 0.05% topically to the vulva once or  twice a week Return to [place], CMA  Brayton Mars, MD  Note: This dictation was prepared with Dragon dictation along with smaller phrase technology. Any transcriptional errors that result from this process are unintentional.

## 2016-04-11 NOTE — Patient Instructions (Signed)
1. Pap smear is done 2. Mammograms per Dr. Doristine Counter 3. No stool cards are given due to colonoscopy this past year 4. Screening labs are obtained through primary care 5. Recommend healthy eating with exercise and low steady weight loss-1 pound per month 6. Return in 1 year for annual exam   Health Maintenance for Postmenopausal Women Introduction Menopause is a normal process in which your reproductive ability comes to an end. This process happens gradually over a span of months to years, usually between the ages of 34 and 76. Menopause is complete when you have missed 12 consecutive menstrual periods. It is important to talk with your health care provider about some of the most common conditions that affect postmenopausal women, such as heart disease, cancer, and bone loss (osteoporosis). Adopting a healthy lifestyle and getting preventive care can help to promote your health and wellness. Those actions can also lower your chances of developing some of these common conditions. What should I know about menopause? During menopause, you may experience a number of symptoms, such as:  Moderate-to-severe hot flashes.  Night sweats.  Decrease in sex drive.  Mood swings.  Headaches.  Tiredness.  Irritability.  Memory problems.  Insomnia. Choosing to treat or not to treat menopausal changes is an individual decision that you make with your health care provider. What should I know about hormone replacement therapy and supplements? Hormone therapy products are effective for treating symptoms that are associated with menopause, such as hot flashes and night sweats. Hormone replacement carries certain risks, especially as you become older. If you are thinking about using estrogen or estrogen with progestin treatments, discuss the benefits and risks with your health care provider. What should I know about heart disease and stroke? Heart disease, heart attack, and stroke become more likely as you  age. This may be due, in part, to the hormonal changes that your body experiences during menopause. These can affect how your body processes dietary fats, triglycerides, and cholesterol. Heart attack and stroke are both medical emergencies. There are many things that you can do to help prevent heart disease and stroke:  Have your blood pressure checked at least every 1-2 years. High blood pressure causes heart disease and increases the risk of stroke.  If you are 91-55 years old, ask your health care provider if you should take aspirin to prevent a heart attack or a stroke.  Do not use any tobacco products, including cigarettes, chewing tobacco, or electronic cigarettes. If you need help quitting, ask your health care provider.  It is important to eat a healthy diet and maintain a healthy weight.  Be sure to include plenty of vegetables, fruits, low-fat dairy products, and lean protein.  Avoid eating foods that are high in solid fats, added sugars, or salt (sodium).  Get regular exercise. This is one of the most important things that you can do for your health.  Try to exercise for at least 150 minutes each week. The type of exercise that you do should increase your heart rate and make you sweat. This is known as moderate-intensity exercise.  Try to do strengthening exercises at least twice each week. Do these in addition to the moderate-intensity exercise.  Know your numbers.Ask your health care provider to check your cholesterol and your blood glucose. Continue to have your blood tested as directed by your health care provider. What should I know about cancer screening? There are several types of cancer. Take the following steps to reduce your risk  and to catch any cancer development as early as possible. Breast Cancer  Practice breast self-awareness.  This means understanding how your breasts normally appear and feel.  It also means doing regular breast self-exams. Let your health  care provider know about any changes, no matter how small.  If you are 8 or older, have a clinician do a breast exam (clinical breast exam or CBE) every year. Depending on your age, family history, and medical history, it may be recommended that you also have a yearly breast X-ray (mammogram).  If you have a family history of breast cancer, talk with your health care provider about genetic screening.  If you are at high risk for breast cancer, talk with your health care provider about having an MRI and a mammogram every year.  Breast cancer (BRCA) gene test is recommended for women who have family members with BRCA-related cancers. Results of the assessment will determine the need for genetic counseling and BRCA1 and for BRCA2 testing. BRCA-related cancers include these types:  Breast. This occurs in males or females.  Ovarian.  Tubal. This may also be called fallopian tube cancer.  Cancer of the abdominal or pelvic lining (peritoneal cancer).  Prostate.  Pancreatic. Cervical, Uterine, and Ovarian Cancer  Your health care provider may recommend that you be screened regularly for cancer of the pelvic organs. These include your ovaries, uterus, and vagina. This screening involves a pelvic exam, which includes checking for microscopic changes to the surface of your cervix (Pap test).  For women ages 21-65, health care providers may recommend a pelvic exam and a Pap test every three years. For women ages 48-65, they may recommend the Pap test and pelvic exam, combined with testing for human papilloma virus (HPV), every five years. Some types of HPV increase your risk of cervical cancer. Testing for HPV may also be done on women of any age who have unclear Pap test results.  Other health care providers may not recommend any screening for nonpregnant women who are considered low risk for pelvic cancer and have no symptoms. Ask your health care provider if a screening pelvic exam is right for  you.  If you have had past treatment for cervical cancer or a condition that could lead to cancer, you need Pap tests and screening for cancer for at least 20 years after your treatment. If Pap tests have been discontinued for you, your risk factors (such as having a new sexual partner) need to be reassessed to determine if you should start having screenings again. Some women have medical problems that increase the chance of getting cervical cancer. In these cases, your health care provider may recommend that you have screening and Pap tests more often.  If you have a family history of uterine cancer or ovarian cancer, talk with your health care provider about genetic screening.  If you have vaginal bleeding after reaching menopause, tell your health care provider.  There are currently no reliable tests available to screen for ovarian cancer. Lung Cancer  Lung cancer screening is recommended for adults 105-65 years old who are at high risk for lung cancer because of a history of smoking. A yearly low-dose CT scan of the lungs is recommended if you:  Currently smoke.  Have a history of at least 30 pack-years of smoking and you currently smoke or have quit within the past 15 years. A pack-year is smoking an average of one pack of cigarettes per day for one year. Yearly screening should:  Continue until it has been 15 years since you quit.  Stop if you develop a health problem that would prevent you from having lung cancer treatment. Colorectal Cancer  This type of cancer can be detected and can often be prevented.  Routine colorectal cancer screening usually begins at age 49 and continues through age 25.  If you have risk factors for colon cancer, your health care provider may recommend that you be screened at an earlier age.  If you have a family history of colorectal cancer, talk with your health care provider about genetic screening.  Your health care provider may also recommend using  home test kits to check for hidden blood in your stool.  A small camera at the end of a tube can be used to examine your colon directly (sigmoidoscopy or colonoscopy). This is done to check for the earliest forms of colorectal cancer.  Direct examination of the colon should be repeated every 5-10 years until age 86. However, if early forms of precancerous polyps or small growths are found or if you have a family history or genetic risk for colorectal cancer, you may need to be screened more often. Skin Cancer  Check your skin from head to toe regularly.  Monitor any moles. Be sure to tell your health care provider:  About any new moles or changes in moles, especially if there is a change in a mole's shape or color.  If you have a mole that is larger than the size of a pencil eraser.  If any of your family members has a history of skin cancer, especially at a young age, talk with your health care provider about genetic screening.  Always use sunscreen. Apply sunscreen liberally and repeatedly throughout the day.  Whenever you are outside, protect yourself by wearing long sleeves, pants, a wide-brimmed hat, and sunglasses. What should I know about osteoporosis? Osteoporosis is a condition in which bone destruction happens more quickly than new bone creation. After menopause, you may be at an increased risk for osteoporosis. To help prevent osteoporosis or the bone fractures that can happen because of osteoporosis, the following is recommended:  If you are 36-82 years old, get at least 1,000 mg of calcium and at least 600 mg of vitamin D per day.  If you are older than age 18 but younger than age 39, get at least 1,200 mg of calcium and at least 600 mg of vitamin D per day.  If you are older than age 39, get at least 1,200 mg of calcium and at least 800 mg of vitamin D per day. Smoking and excessive alcohol intake increase the risk of osteoporosis. Eat foods that are rich in calcium and  vitamin D, and do weight-bearing exercises several times each week as directed by your health care provider. What should I know about how menopause affects my mental health? Depression may occur at any age, but it is more common as you become older. Common symptoms of depression include:  Low or sad mood.  Changes in sleep patterns.  Changes in appetite or eating patterns.  Feeling an overall lack of motivation or enjoyment of activities that you previously enjoyed.  Frequent crying spells. Talk with your health care provider if you think that you are experiencing depression. What should I know about immunizations? It is important that you get and maintain your immunizations. These include:  Tetanus, diphtheria, and pertussis (Tdap) booster vaccine.  Influenza every year before the flu season begins.  Pneumonia  vaccine.  Shingles vaccine. Your health care provider may also recommend other immunizations. This information is not intended to replace advice given to you by your health care provider. Make sure you discuss any questions you have with your health care provider. Document Released: 04/19/2005 Document Revised: 09/15/2015 Document Reviewed: 11/29/2014  2017 [employer]vier

## 2016-04-15 ENCOUNTER — Telehealth: Payer: Self-pay | Admitting: Obstetrics and Gynecology

## 2016-04-15 NOTE — Telephone Encounter (Signed)
PT CALLED AND LM ON OFFICE VM STATING SHE WOULD LIKE FOR YOU TO RETURN HER CALL.

## 2016-04-15 NOTE — Telephone Encounter (Signed)
Pt made me aware that after last visit she developed floaters in left eye. Eye dr stated that d/t the strain and squinting during rectal exam. Pt wanted me to make a note in her chart. See sticky note.

## 2016-04-16 LAB — PAP IG AND HPV HIGH-RISK
HPV, high-risk: NEGATIVE
PAP SMEAR COMMENT: 0

## 2016-05-01 ENCOUNTER — Other Ambulatory Visit: Payer: BLUE CROSS/BLUE SHIELD

## 2016-05-01 ENCOUNTER — Ambulatory Visit: Payer: BLUE CROSS/BLUE SHIELD | Admitting: Internal Medicine

## 2016-05-10 ENCOUNTER — Inpatient Hospital Stay: Payer: BLUE CROSS/BLUE SHIELD | Attending: Internal Medicine

## 2016-05-10 ENCOUNTER — Inpatient Hospital Stay (HOSPITAL_BASED_OUTPATIENT_CLINIC_OR_DEPARTMENT_OTHER): Payer: BLUE CROSS/BLUE SHIELD | Admitting: Internal Medicine

## 2016-05-10 VITALS — BP 154/86 | HR 86 | Temp 97.6°F | Resp 20 | Ht 63.0 in | Wt 253.5 lb

## 2016-05-10 DIAGNOSIS — C50311 Malignant neoplasm of lower-inner quadrant of right female breast: Secondary | ICD-10-CM

## 2016-05-10 DIAGNOSIS — Z79899 Other long term (current) drug therapy: Secondary | ICD-10-CM | POA: Insufficient documentation

## 2016-05-10 DIAGNOSIS — E079 Disorder of thyroid, unspecified: Secondary | ICD-10-CM | POA: Diagnosis not present

## 2016-05-10 DIAGNOSIS — Z9221 Personal history of antineoplastic chemotherapy: Secondary | ICD-10-CM | POA: Insufficient documentation

## 2016-05-10 DIAGNOSIS — Z853 Personal history of malignant neoplasm of breast: Secondary | ICD-10-CM

## 2016-05-10 DIAGNOSIS — I10 Essential (primary) hypertension: Secondary | ICD-10-CM | POA: Diagnosis not present

## 2016-05-10 DIAGNOSIS — M25562 Pain in left knee: Secondary | ICD-10-CM

## 2016-05-10 DIAGNOSIS — M25561 Pain in right knee: Secondary | ICD-10-CM | POA: Diagnosis not present

## 2016-05-10 DIAGNOSIS — J45909 Unspecified asthma, uncomplicated: Secondary | ICD-10-CM | POA: Insufficient documentation

## 2016-05-10 DIAGNOSIS — E785 Hyperlipidemia, unspecified: Secondary | ICD-10-CM | POA: Insufficient documentation

## 2016-05-10 DIAGNOSIS — Z171 Estrogen receptor negative status [ER-]: Secondary | ICD-10-CM | POA: Diagnosis not present

## 2016-05-10 LAB — CBC WITH DIFFERENTIAL/PLATELET
BASOS ABS: 0.1 10*3/uL (ref 0–0.1)
Basophils Relative: 1 %
Eosinophils Absolute: 0.2 10*3/uL (ref 0–0.7)
Eosinophils Relative: 2 %
HCT: 38.7 % (ref 35.0–47.0)
Hemoglobin: 13.3 g/dL (ref 12.0–16.0)
LYMPHS PCT: 30 %
Lymphs Abs: 3.4 10*3/uL (ref 1.0–3.6)
MCH: 31.1 pg (ref 26.0–34.0)
MCHC: 34.5 g/dL (ref 32.0–36.0)
MCV: 90.2 fL (ref 80.0–100.0)
Monocytes Absolute: 0.6 10*3/uL (ref 0.2–0.9)
Monocytes Relative: 6 %
NEUTROS PCT: 61 %
Neutro Abs: 7 10*3/uL — ABNORMAL HIGH (ref 1.4–6.5)
PLATELETS: 401 10*3/uL (ref 150–440)
RBC: 4.29 MIL/uL (ref 3.80–5.20)
RDW: 12.6 % (ref 11.5–14.5)
WBC: 11.3 10*3/uL — AB (ref 3.6–11.0)

## 2016-05-10 LAB — COMPREHENSIVE METABOLIC PANEL
ALT: 17 U/L (ref 14–54)
AST: 23 U/L (ref 15–41)
Albumin: 4.3 g/dL (ref 3.5–5.0)
Alkaline Phosphatase: 108 U/L (ref 38–126)
Anion gap: 8 (ref 5–15)
BUN: 11 mg/dL (ref 6–20)
CHLORIDE: 101 mmol/L (ref 101–111)
CO2: 26 mmol/L (ref 22–32)
CREATININE: 0.56 mg/dL (ref 0.44–1.00)
Calcium: 9.3 mg/dL (ref 8.9–10.3)
GFR calc Af Amer: >60 mL/min (ref >60)
Glucose, Bld: 110 mg/dL — ABNORMAL HIGH (ref 65–99)
Potassium: 3.7 mmol/L (ref 3.5–5.1)
SODIUM: 135 mmol/L (ref 135–145)
Total Bilirubin: 0.5 mg/dL (ref 0.3–1.2)
Total Protein: 7.6 g/dL (ref 6.5–8.1)

## 2016-05-10 NOTE — Progress Notes (Signed)
Patient here for breast cancer f/u with Dr. Rogue Bussing. She has no medical complaints.  RN Chaperoned provider with Breast Exam

## 2016-05-10 NOTE — Assessment & Plan Note (Addendum)
Breast cancer stage III triple negative. Clinical trial  S/p adjuvant chemo with dose dense Adriamycin followed by Taxol with carboplatin  Clinically no evidence of recurrence. Recent mammogram July 2017 normal [Dr.Byrnett]  # CBC normal CMP- normal.   # Bil knee pain- s/p cortisone shots.  # Follow-up in 6 months with CBC CMP and tumor markers' CT C/A/P- prior.

## 2016-05-10 NOTE — Progress Notes (Signed)
[facility] OFFICE PROGRESS NOTE  Patient Care Team: Glendon Axe, MD as PCP - General (Internal Medicine) Eusebio Me, MD (Unknown Physician Specialty) Robert Bellow, MD (General Surgery)  Cancer Staging No matching staging information was found for the patient.   Oncology History   1. Carcinoma of breast pT1c oN2a Mo  stage  IIIa. extensive lymphovascular invasion 6/14 lymph nodes uninvolved with extra capsular  invasion.Marland Kitchen     2.NEGATIVE FOR BRCA 1  AND 2 MUTATION  estrogen and progesterone receptor negative HER-2/neu receptor negative (triple negative disease) diagnosis in July of 2015.  Status post lumpectomy and axillary node dissection, 2. 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  female pleasant patient above history of Stage III triple negative breast cancer currently on surveillance Is here for follow-up. Denies any weight loss. Denies any nausea vomiting. Appetite is good. Denies any headaches.  Patient denies any unusual lumps or bumps.    REVIEW OF SYSTEMS:  A complete 10 point review of system is done which is negative except mentioned above/history of present illness.   PAST MEDICAL HISTORY :  Past Medical History:  Diagnosis Date  . Allergy   . Asthma   . Breast cancer ([place])   . Breast cancer of lower-inner quadrant of right female breast ([place]) 08/2013   Triple negative, T1c, N2a.(6/14 nodes +) Treated with  Cytoxan and Adriamycin in a dose dense fashion followed by carbotaxol.    . Bronchitis   . Diabetes mellitus without complication ([place])   . Hyperlipidemia   . Hypertension   . Thyroid disease     PAST SURGICAL HISTORY :   Past Surgical History:  Procedure Laterality Date  . BREAST SURGERY Right 09-27-13   Wide excision of Right breast lesion with attempted sentinel node biopsy followed by axillary dissection  . COLONOSCOPY N/A 02/23/2015   Procedure: COLONOSCOPY;  Surgeon: Manya Silvas, MD;  [facility];  Service: Endoscopy;  Laterality: N/A;   Needs early AM - IDDM  . DILATION AND CURETTAGE OF UTERUS     Dr Vernie Ammons  . PORTACATH PLACEMENT  11-02-13    FAMILY HISTORY :   Family History  Problem Relation Age of Onset  . Diabetes Mother   . Breast cancer Neg Hx   . Ovarian cancer Neg Hx   . Colon cancer Neg Hx     SOCIAL HISTORY:   Social History  Substance Use Topics  . Smoking status: Never Smoker  . Smokeless tobacco: Never Used  . Alcohol use No    ALLERGIES:  is allergic to other.  MEDICATIONS:  Current Outpatient Prescriptions  Medication Sig Dispense Refill  . acetaminophen (TYLENOL) 325 MG tablet Take 650 mg by mouth every 6 (six) hours as needed for moderate pain.     Marland Kitchen atorvastatin (LIPITOR) 40 MG tablet Take 40 mg by mouth daily.    . celecoxib (CELEBREX) 200 MG capsule TK 1 C PO  QD  2  . clobetasol ointment (TEMOVATE) 5.18 % Apply 1 application topically once a week.  0  . fluticasone (FLONASE) 50 MCG/ACT nasal spray Place 1 spray into both nostrils daily.     . LUTEIN-ZEAXANTHIN PO Take 1 tablet by mouth daily.     . Magnesium 250 MG TABS Take 1 tablet by mouth 2 (two) times daily.     . metFORMIN (GLUCOPHAGE) 500 MG tablet TAKE 1 TABLET(500 MG) BY MOUTH TWICE DAILY    . Multiple Vitamins-Minerals (MULTIVITAMIN WITH MINERALS) tablet Take 1 tablet by mouth daily.    . Omega-3 Fatty Acids (FISH OIL PO) Take by mouth.    Marland Kitchen omeprazole  (PRILOSEC) 20 MG capsule Take 20 mg by mouth daily.    Marland Kitchen telmisartan-hydrochlorothiazide (MICARDIS HCT) 40-12.5 MG tablet TAKE 1 TABLET BY MOUTH ONCE DAILY     No current facility-administered medications for this visit.    Facility-Administered Medications Ordered in Other Visits  Medication Dose Route Frequency Provider Last Rate Last Dose  . heparin lock flush 100 unit/mL  500 Units Intravenous Once Forest Gleason, MD      . sodium chloride 0.9 % injection 10 mL  10 mL Intravenous PRN Forest Gleason, MD      . sodium chloride 0.9 % injection 10 mL  10 mL Intravenous PRN Forest Gleason, MD   10 mL at 10/26/14 0940    PHYSICAL EXAMINATION: ECOG PERFORMANCE STATUS: 0 - Asymptomatic  BP (!) 154/86 (BP Location: Left Arm, Patient Position: Sitting)   Pulse 86   Temp 97.6 F (36.4 C) (Tympanic)   Resp 20   Ht '5\' 3"'  (1.6 m)   Wt 253 lb 8 oz (115 kg)   BMI 44.91 kg/m   Filed Weights   05/10/16 1018  Weight: 253 lb 8 oz (115 kg)    GENERAL: Well-nourished well-developed; Alert, no distress and comfortable.   Alone EYES: no pallor or icterus OROPHARYNX: no thrush or ulceration; good dentition  NECK: supple, no masses felt LYMPH:  no palpable lymphadenopathy in the cervical, axillary or inguinal regions LUNGS: clear to auscultation and  No wheeze or crackles HEART/CVS: regular rate & rhythm and no murmurs; No lower extremity edema ABDOMEN:abdomen soft, non-tender and normal bowel sounds Musculoskeletal:no cyanosis of digits and no clubbing  PSYCH: alert & oriented x 3 with fluent speech NEURO: no focal motor/sensory deficits SKIN:  no rashes or significant lesions .Right and left BREAST exam [in the presence of nurse]- no unusual skin changes or dominant masses felt. Surgical scars noted.      LABORATORY DATA:  I have reviewed the data as listed    Component Value Date/Time   NA 135 05/10/2016 0950   NA 138 04/06/2014 1328   K 3.7 05/10/2016 0950   K 4.1 04/06/2014 1328   CL  101 05/10/2016 0950   CL 101 04/06/2014 1328   CO2 26 05/10/2016 0950   CO2 30 04/06/2014 1328   GLUCOSE 110 (H) 05/10/2016 0950   GLUCOSE 105 (H) 04/06/2014 1328   BUN 11 05/10/2016 0950   BUN 8 04/06/2014 1328   CREATININE 0.56 05/10/2016 0950   CREATININE 0.62 04/06/2014 1328   CALCIUM 9.3 05/10/2016 0950   CALCIUM 8.5 04/06/2014 1328   PROT 7.6 05/10/2016 0950   PROT 6.7 04/06/2014 1328   ALBUMIN 4.3 05/10/2016 0950   ALBUMIN 3.3 (L) 04/06/2014 1328   AST 23 05/10/2016 0950   AST 18 04/06/2014 1328   ALT 17 05/10/2016 0950  ALT 32 04/06/2014 1328   ALKPHOS 108 05/10/2016 0950   ALKPHOS 118 (H) 04/06/2014 1328   BILITOT 0.5 05/10/2016 0950   BILITOT 0.4 04/06/2014 1328   GFRNONAA >60 05/10/2016 0950   GFRNONAA >60 04/06/2014 1328   GFRNONAA >60 12/01/2013 0926   GFRAA >60 05/10/2016 0950   GFRAA >60 04/06/2014 1328   GFRAA >60 12/01/2013 0926    No results found for: SPEP, UPEP  Lab Results  Component Value Date   WBC 11.3 (H) 05/10/2016   NEUTROABS 7.0 (H) 05/10/2016   HGB 13.3 05/10/2016   HCT 38.7 05/10/2016   MCV 90.2 05/10/2016   PLT 401 05/10/2016      Chemistry      Component Value Date/Time   NA 135 05/10/2016 0950   NA 138 04/06/2014 1328   K 3.7 05/10/2016 0950   K 4.1 04/06/2014 1328   CL 101 05/10/2016 0950   CL 101 04/06/2014 1328   CO2 26 05/10/2016 0950   CO2 30 04/06/2014 1328   BUN 11 05/10/2016 0950   BUN 8 04/06/2014 1328   CREATININE 0.56 05/10/2016 0950   CREATININE 0.62 04/06/2014 1328      Component Value Date/Time   CALCIUM 9.3 05/10/2016 0950   CALCIUM 8.5 04/06/2014 1328   ALKPHOS 108 05/10/2016 0950   ALKPHOS 118 (H) 04/06/2014 1328   AST 23 05/10/2016 0950   AST 18 04/06/2014 1328   ALT 17 05/10/2016 0950   ALT 32 04/06/2014 1328   BILITOT 0.5 05/10/2016 0950   BILITOT 0.4 04/06/2014 1328       RADIOGRAPHIC STUDIES: I have personally reviewed the radiological images as listed and agreed with the findings in  the report. No results found.   ASSESSMENT & PLAN:  Carcinoma of lower-inner quadrant of right breast in female, estrogen receptor negative ([place]) Breast cancer stage III triple negative. Clinical trial  S/p adjuvant chemo with dose dense Adriamycin followed by Taxol with carboplatin  Clinically no evidence of recurrence. Recent mammogram July 2017 normal [Dr.Byrnett]  # CBC normal CMP- normal.   # Bil knee pain- s/p cortisone shots.  # Follow-up in 6 months with CBC CMP and tumor markers' CT C/A/P- prior.    Orders Placed This Encounter  Procedures  . CT ABDOMEN PELVIS W CONTRAST    Standing Status:   Future    Standing Expiration Date:   08/09/2017    Order Specific Question:   Reason for Exam (SYMPTOM  OR DIAGNOSIS REQUIRED)    Answer:   breast cancer    Order Specific Question:   Is the patient pregnant?    Answer:   No    Order Specific Question:   Preferred imaging location?    Answer:   [facility]  . CT CHEST W CONTRAST    Standing Status:   Future    Standing Expiration Date:   07/10/2017    Order Specific Question:   Reason for Exam (SYMPTOM  OR DIAGNOSIS REQUIRED)    Answer:   breast cancer    Order Specific Question:   Is the patient pregnant?    Answer:   No    Order Specific Question:   Preferred imaging location?    Answer:   [facility]  . CBC with Differential    Standing Status:   Future    Standing Expiration Date:   05/10/2017  . Comprehensive metabolic panel    Standing Status:   Future    Standing Expiration Date:  05/10/2017  . Cancer antigen 27.29    Standing Status:   Future    Standing Expiration Date:   05/10/2017   All questions were answered. The patient knows to call the clinic with any problems, questions or concerns.      Cammie Sickle, MD 05/10/2016 1:10 PM

## 2016-05-11 LAB — CANCER ANTIGEN 27.29: CA 27.29: 18.2 U/mL (ref 0.0–38.6)

## 2016-08-22 ENCOUNTER — Other Ambulatory Visit: Payer: Self-pay

## 2016-08-22 DIAGNOSIS — Z171 Estrogen receptor negative status [ER-]: Principal | ICD-10-CM

## 2016-08-22 DIAGNOSIS — C50311 Malignant neoplasm of lower-inner quadrant of right female breast: Secondary | ICD-10-CM

## 2016-08-27 ENCOUNTER — Telehealth: Payer: Self-pay | Admitting: Obstetrics and Gynecology

## 2016-08-27 NOTE — Telephone Encounter (Signed)
Pt has been using temovate weekly for several months with relief of her sx. About ten days ago she had a flair up. She has been using temovate bid sx. Sx are no worse but no better either. She states it all [place] [facility] [employer] [facility] [place].  Pt advised to use temovate bid x 4 days. Then once a day for 4 weeks. Then qod for 4 weeks. If sx are no better or get worse she will need to be seen.  Pt voices understanding.

## 2016-08-27 NOTE — Telephone Encounter (Signed)
Patient called and stated that she has been using  clobetasol ointment (TEMOVATE) 0.05 %, And she is still having flair ups and it has been past 2 wks, Patient was informed to contact "Dr.De" if issues/problems continue to occur. Patient would like to speak with "Crystal or Dr.De" in regards to her medication problems. And also informed me that she will have difficulty scheduling an appointment. Please advise.

## 2016-08-28 ENCOUNTER — Inpatient Hospital Stay
Admission: RE | Admit: 2016-08-28 | Discharge: 2016-08-28 | Disposition: A | Discharge status: Home / Self Care | Payer: Self-pay | Source: Ambulatory Visit | Attending: *Deleted | Admitting: *Deleted

## 2016-08-28 ENCOUNTER — Other Ambulatory Visit: Payer: Self-pay | Admitting: *Deleted

## 2016-08-28 DIAGNOSIS — Z9289 Personal history of other medical treatment: Secondary | ICD-10-CM

## 2016-10-10 ENCOUNTER — Ambulatory Visit
Admission: RE | Admit: 2016-10-10 | Discharge: 2016-10-10 | Disposition: A | Discharge status: Home / Self Care | Payer: BLUE CROSS/BLUE SHIELD | Source: Ambulatory Visit | Attending: General Surgery | Admitting: General Surgery

## 2016-10-10 DIAGNOSIS — C50311 Malignant neoplasm of lower-inner quadrant of right female breast: Secondary | ICD-10-CM

## 2016-10-10 DIAGNOSIS — Z171 Estrogen receptor negative status [ER-]: Principal | ICD-10-CM

## 2016-10-10 HISTORY — DX: Personal history of irradiation: Z92.3

## 2016-10-10 HISTORY — DX: Personal history of antineoplastic chemotherapy: Z92.21

## 2016-10-17 ENCOUNTER — Ambulatory Visit: Payer: BLUE CROSS/BLUE SHIELD | Admitting: General Surgery

## 2016-10-17 ENCOUNTER — Ambulatory Visit (INDEPENDENT_AMBULATORY_CARE_PROVIDER_SITE_OTHER): Payer: BLUE CROSS/BLUE SHIELD | Admitting: General Surgery

## 2016-10-17 ENCOUNTER — Encounter: Payer: Self-pay | Admitting: General Surgery

## 2016-10-17 VITALS — BP 134/78 | HR 78 | Resp 14 | Ht 62.0 in | Wt 254.0 lb

## 2016-10-17 DIAGNOSIS — C50311 Malignant neoplasm of lower-inner quadrant of right female breast: Secondary | ICD-10-CM | POA: Diagnosis not present

## 2016-10-17 DIAGNOSIS — Z171 Estrogen receptor negative status [ER-]: Secondary | ICD-10-CM

## 2016-10-17 NOTE — Progress Notes (Signed)
Patient ID: Felicia Kidd, female   DOB: [birth year], 58 y.o.   MRN: [id number]  Chief Complaint  Patient presents with  . Other    mammogram    HPI Felicia Kidd is a 56 y.o. female with a history of right breast cancer who presents for a breast evaluation. The most recent mammogram was done on 10/10/16. Patient does perform regular self breast checks and gets regular mammograms done. She reports that she is doing well and has no new breast complaints.  She is in need of bilateral knee replacements and is putting these off for now.    HPI  Past Medical History:  Diagnosis Date  . Allergy   . Asthma   . Breast cancer ([place]) 2015    right breast lumpectomy with chemo and rad tx  . Breast cancer of lower-inner quadrant of right female breast ([place]) 08/2013   Triple negative, T1c, N2a.(6/14 nodes +) Treated with Cytoxan and Adriamycin in a dose dense fashion followed by carbotaxol.    . Bronchitis   . Diabetes mellitus without complication ([place])   . Hyperlipidemia   . Hypertension   . Personal history of chemotherapy 10/2013   right breast ca  . Personal history of radiation therapy 04/2014   right breast ca  . Thyroid disease     Past Surgical History:  Procedure Laterality Date  . BREAST BIOPSY Right 09/07/2013   invasive mammary carcinoma  . 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 Clear margins  . BREAST SURGERY Right 09-27-13   Wide excision of Right breast lesion with attempted sentinel node biopsy followed by axillary dissection  . COLONOSCOPY N/A 02/23/2015   Procedure: COLONOSCOPY;  Surgeon: Manya Silvas, MD;  Location: [facility] ENDOSCOPY;  Service: Endoscopy;  Laterality: N/A;   Needs early AM - IDDM  . DILATION AND CURETTAGE OF UTERUS     Dr Vernie Ammons  . PORTACATH PLACEMENT  11/02/2013   removed 11/02/2014.    Family History  Problem Relation Age of Onset  . Diabetes Mother   . Breast cancer Neg Hx    . Ovarian cancer Neg Hx   . Colon cancer Neg Hx     Social History Social History  Substance Use Topics  . Smoking status: Never Smoker  . Smokeless tobacco: Never Used  . Alcohol use No    No Active Allergies  Current Outpatient Prescriptions  Medication Sig Dispense Refill  . acetaminophen (TYLENOL) 325 MG tablet Take 650 mg by mouth every 6 (six) hours as needed for moderate pain.     Marland Kitchen atorvastatin (LIPITOR) 40 MG tablet Take 40 mg by mouth daily.    . celecoxib (CELEBREX) 200 MG capsule TK 1 C PO QD  2  . clobetasol ointment (TEMOVATE) 1.75 % Apply 1 application topically once a week.  0  . fluticasone (FLONASE) 50 MCG/ACT nasal spray Place 1 spray into both nostrils as needed.     . gabapentin (NEURONTIN) 300 MG capsule Take 300 mg by mouth 2 (two) times daily.    . LUTEIN-ZEAXANTHIN PO Take 1 tablet by mouth daily.     . Magnesium 250 MG TABS Take 1 tablet by mouth 2 (two) times daily.     . metFORMIN (GLUCOPHAGE) 500 MG tablet TAKE 1 TABLET(500 MG) BY MOUTH TWICE DAILY    . Multiple Vitamins-Minerals (MULTIVITAMIN WITH MINERALS) tablet Take 1 tablet by mouth daily.    . Omega-3 Fatty Acids (  FISH OIL PO) Take by mouth.    Marland Kitchen omeprazole (PRILOSEC) 20 MG capsule Take 20 mg by mouth daily.    Marland Kitchen telmisartan-hydrochlorothiazide (MICARDIS HCT) 40-12.5 MG tablet TAKE 1 TABLET BY MOUTH ONCE DAILY     No current facility-administered medications for this visit.    Facility-Administered Medications Ordered in Other Visits  Medication Dose Route Frequency Provider Last Rate Last Dose  . heparin lock flush 100 unit/mL  500 Units Intravenous Once Choksi, Janak, MD      . sodium chloride 0.9 % injection 10 mL  10 mL Intravenous PRN Choksi, Janak, MD      . sodium chloride 0.9 % injection 10 mL  10 mL Intravenous PRN Forest Gleason, MD   10 mL at 10/26/14 0940    Review of Systems Review of Systems  Constitutional: Negative.   Respiratory: Negative.   Cardiovascular: Negative.      Blood pressure 134/78, pulse 78, resp. rate 14, height 5\' 2"  (1.575 m), weight 254 lb (115.2 kg).  Physical Exam Physical Exam  Constitutional: She is oriented to person, place, and time. She appears well-developed and well-nourished.  Eyes: Conjunctivae are normal. No scleral icterus.  Neck: Neck supple.  Cardiovascular: Normal rate, regular rhythm and normal heart sounds.   Pulmonary/Chest: Effort normal and breath sounds normal. Right breast exhibits no inverted nipple, no mass, no nipple discharge, no skin change and no tenderness. Left breast exhibits no inverted nipple, no mass, no nipple discharge, no skin change and no tenderness.    Lymphadenopathy:    She has no cervical adenopathy.  Neurological: She is alert and oriented to person, place, and time.  Skin: Skin is warm and dry.  Psychiatric: She has a normal mood and affect.    Data Reviewed 10/10/2016 bilateral diagnostic mammograms were reviewed. Postsurgical changes in the inferior aspect of the right breast. BI-RADS-2.  Assessment    Doing well now 3 years status post insert of treatment of right breast cancer with adjuvant chemotherapy.    Plan    No evidence of recurrent disease. We'll plan for a follow-up examination in one year.    HPI, Physical Exam, Assessment and Plan have been scribed under the direction and in the presence of Robert Bellow, MD  Concepcion Living, LPN  I have completed the exam and reviewed the above documentation for accuracy and completeness.  I agree with the above.  Haematologist has been used and any errors in dictation or transcription are unintentional.  Hervey Ard, M.D., F.A.C.S.   Robert Bellow 10/18/2016, 8:36 PM

## 2016-10-18 ENCOUNTER — Encounter: Payer: Self-pay | Admitting: General Surgery

## 2016-10-23 ENCOUNTER — Ambulatory Visit: Payer: BLUE CROSS/BLUE SHIELD | Admitting: General Surgery

## 2016-11-13 ENCOUNTER — Inpatient Hospital Stay: Payer: BLUE CROSS/BLUE SHIELD | Attending: Internal Medicine

## 2016-11-13 ENCOUNTER — Ambulatory Visit
Admission: RE | Admit: 2016-11-13 | Discharge: 2016-11-13 | Disposition: A | Discharge status: Home / Self Care | Payer: BLUE CROSS/BLUE SHIELD | Source: Ambulatory Visit | Attending: Internal Medicine | Admitting: Internal Medicine

## 2016-11-13 DIAGNOSIS — Z7984 Long term (current) use of oral hypoglycemic drugs: Secondary | ICD-10-CM | POA: Insufficient documentation

## 2016-11-13 DIAGNOSIS — E119 Type 2 diabetes mellitus without complications: Secondary | ICD-10-CM | POA: Insufficient documentation

## 2016-11-13 DIAGNOSIS — Z853 Personal history of malignant neoplasm of breast: Secondary | ICD-10-CM | POA: Diagnosis present

## 2016-11-13 DIAGNOSIS — C50311 Malignant neoplasm of lower-inner quadrant of right female breast: Secondary | ICD-10-CM | POA: Diagnosis present

## 2016-11-13 DIAGNOSIS — E785 Hyperlipidemia, unspecified: Secondary | ICD-10-CM | POA: Insufficient documentation

## 2016-11-13 DIAGNOSIS — N83201 Unspecified ovarian cyst, right side: Secondary | ICD-10-CM | POA: Diagnosis not present

## 2016-11-13 DIAGNOSIS — Z9221 Personal history of antineoplastic chemotherapy: Secondary | ICD-10-CM | POA: Diagnosis not present

## 2016-11-13 DIAGNOSIS — I10 Essential (primary) hypertension: Secondary | ICD-10-CM | POA: Insufficient documentation

## 2016-11-13 DIAGNOSIS — Z171 Estrogen receptor negative status [ER-]: Secondary | ICD-10-CM | POA: Insufficient documentation

## 2016-11-13 DIAGNOSIS — Z923 Personal history of irradiation: Secondary | ICD-10-CM | POA: Insufficient documentation

## 2016-11-13 DIAGNOSIS — E079 Disorder of thyroid, unspecified: Secondary | ICD-10-CM | POA: Diagnosis not present

## 2016-11-13 DIAGNOSIS — Z79899 Other long term (current) drug therapy: Secondary | ICD-10-CM | POA: Diagnosis not present

## 2016-11-13 LAB — CBC WITH DIFFERENTIAL/PLATELET
Basophils Absolute: 0.1 10*3/uL (ref 0–0.1)
Basophils Relative: 1 %
EOS ABS: 0.3 10*3/uL (ref 0–0.7)
EOS PCT: 2 %
HCT: 36.8 % (ref 35.0–47.0)
HEMOGLOBIN: 12.9 g/dL (ref 12.0–16.0)
LYMPHS ABS: 2.8 10*3/uL (ref 1.0–3.6)
Lymphocytes Relative: 21 %
MCH: 31.2 pg (ref 26.0–34.0)
MCHC: 35.1 g/dL (ref 32.0–36.0)
MCV: 89.1 fL (ref 80.0–100.0)
MONOS PCT: 6 %
Monocytes Absolute: 0.7 10*3/uL (ref 0.2–0.9)
Neutro Abs: 9.2 10*3/uL — ABNORMAL HIGH (ref 1.4–6.5)
Neutrophils Relative %: 70 %
PLATELETS: 396 10*3/uL (ref 150–440)
RBC: 4.13 MIL/uL (ref 3.80–5.20)
RDW: 12.6 % (ref 11.5–14.5)
WBC: 13.1 10*3/uL — ABNORMAL HIGH (ref 3.6–11.0)

## 2016-11-13 LAB — COMPREHENSIVE METABOLIC PANEL
ALK PHOS: 113 U/L (ref 38–126)
ALT: 16 U/L (ref 14–54)
ANION GAP: 11 (ref 5–15)
AST: 20 U/L (ref 15–41)
Albumin: 4.2 g/dL (ref 3.5–5.0)
BUN: 12 mg/dL (ref 6–20)
CALCIUM: 9.7 mg/dL (ref 8.9–10.3)
CO2: 28 mmol/L (ref 22–32)
Chloride: 100 mmol/L — ABNORMAL LOW (ref 101–111)
Creatinine, Ser: 0.65 mg/dL (ref 0.44–1.00)
Glucose, Bld: 124 mg/dL — ABNORMAL HIGH (ref 65–99)
Potassium: 3.7 mmol/L (ref 3.5–5.1)
SODIUM: 139 mmol/L (ref 135–145)
Total Bilirubin: 0.4 mg/dL (ref 0.3–1.2)
Total Protein: 7.6 g/dL (ref 6.5–8.1)

## 2016-11-13 MED ORDER — IOPAMIDOL (ISOVUE-370) INJECTION 76%
100.0000 mL | Freq: Once | INTRAVENOUS | Status: AC | PRN
  Administered 2016-11-13: 100 mL via INTRAVENOUS

## 2016-11-14 LAB — CANCER ANTIGEN 27.29: CA 27.29: 17.7 U/mL (ref 0.0–38.6)

## 2016-11-15 ENCOUNTER — Encounter: Payer: Self-pay | Admitting: *Deleted

## 2016-11-15 ENCOUNTER — Inpatient Hospital Stay (HOSPITAL_BASED_OUTPATIENT_CLINIC_OR_DEPARTMENT_OTHER): Payer: BLUE CROSS/BLUE SHIELD | Admitting: Internal Medicine

## 2016-11-15 VITALS — BP 153/101 | HR 103 | Temp 97.8°F | Resp 20 | Ht 62.0 in | Wt 254.0 lb

## 2016-11-15 DIAGNOSIS — E079 Disorder of thyroid, unspecified: Secondary | ICD-10-CM

## 2016-11-15 DIAGNOSIS — Z9221 Personal history of antineoplastic chemotherapy: Secondary | ICD-10-CM | POA: Diagnosis not present

## 2016-11-15 DIAGNOSIS — Z923 Personal history of irradiation: Secondary | ICD-10-CM

## 2016-11-15 DIAGNOSIS — I119 Hypertensive heart disease without heart failure: Secondary | ICD-10-CM

## 2016-11-15 DIAGNOSIS — Z7984 Long term (current) use of oral hypoglycemic drugs: Secondary | ICD-10-CM

## 2016-11-15 DIAGNOSIS — I10 Essential (primary) hypertension: Secondary | ICD-10-CM | POA: Diagnosis not present

## 2016-11-15 DIAGNOSIS — Z79899 Other long term (current) drug therapy: Secondary | ICD-10-CM

## 2016-11-15 DIAGNOSIS — Z853 Personal history of malignant neoplasm of breast: Secondary | ICD-10-CM | POA: Diagnosis not present

## 2016-11-15 DIAGNOSIS — N83201 Unspecified ovarian cyst, right side: Secondary | ICD-10-CM | POA: Diagnosis not present

## 2016-11-15 DIAGNOSIS — E785 Hyperlipidemia, unspecified: Secondary | ICD-10-CM

## 2016-11-15 DIAGNOSIS — Z171 Estrogen receptor negative status [ER-]: Secondary | ICD-10-CM | POA: Diagnosis not present

## 2016-11-15 DIAGNOSIS — C50311 Malignant neoplasm of lower-inner quadrant of right female breast: Secondary | ICD-10-CM

## 2016-11-15 NOTE — Assessment & Plan Note (Addendum)
Breast cancer stage III triple negative. Clinical trial  S/p adjuvant chemo with dose dense Adriamycin followed by Taxol with carboplatin  Clinically no evidence of recurrence. SEP 2018- CT scan- C/A/P- ned. Mammogram 2018- NED. # CBC normal CMP- normal.   # Right ovarian cyst-likely benign followed by gynecology.  # Patient's screened for remember study; she has good memory. Not a candidate for the trial.  # Follow-up in 6 months with CBC CMP and tumor markers.   Cc; Dr.Byrnett.

## 2016-11-15 NOTE — Progress Notes (Signed)
[facility] OFFICE PROGRESS NOTE  Patient Care Team: Glendon Axe, MD as PCP - General (Internal Medicine) Ammie Dalton, Okey Regal, MD (Unknown Physician Specialty) Bary Castilla Forest Gleason, MD (General Surgery)  Cancer Staging No matching staging information was found for the patient.   Oncology History   1. Carcinoma of breast pT1c oN2a Mo  stage  IIIa. extensive lymphovascular invasion 6/14 lymph nodes uninvolved with extra capsular  invasion.Marland Kitchen     2.NEGATIVE FOR BRCA 1  AND 2 MUTATION  estrogen and progesterone receptor negative HER-2/neu receptor negative (triple negative disease) diagnosis in July of 2015.  Status post lumpectomy and axillary node dissection, 2. Started on chemotherapy with NSABP protocol is dose dense Cytoxan Adriamycin August of 2015 3.as per protocol patient was switched  to  carboplatinum and Taxol fromOctober 28, 2015 4.Patient has finished carboplatinum and Taxol in March 25, 2014 Would start radiation therapy to the breast in February of 2016     Carcinoma of lower-inner quadrant of right breast in female, estrogen receptor negative ([place])     INTERVAL HISTORY:  Felicia Kidd 56 y.o.  female pleasant patient above history of Stage III triple negative breast cancer currently on surveillance Is here for follow-up/ Review the results of her restaging CAT scan.  Denies any weight loss. Denies any nausea vomiting. Appetite is good. Denies any headaches. Patient denies any unusual lumps or bumps. She denies any memory problems.    REVIEW OF SYSTEMS:  A complete 10 point review of system is done which is negative except mentioned above/history of present illness.   PAST MEDICAL HISTORY :  Past Medical History:  Diagnosis Date  . Allergy   . Asthma   . Breast cancer ([place]) 2015    right breast lumpectomy with chemo and rad tx  . Breast cancer of lower-inner quadrant of right female breast ([place]) 08/2013   Triple negative, T1c, N2a.(6/14  nodes +) Treated with Cytoxan and Adriamycin in a dose dense fashion followed by carbotaxol.    . Bronchitis   . Diabetes mellitus without complication ([place])   . Hyperlipidemia   . Hypertension   . Personal history of chemotherapy 10/2013   right breast ca  . Personal history of radiation therapy 04/2014   right breast ca  . Thyroid disease     PAST SURGICAL HISTORY :   Past Surgical History:  Procedure Laterality Date  . BREAST BIOPSY Right 09/07/2013   invasive mammary carcinoma  . 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 Clear margins  . BREAST SURGERY Right 09-27-13   Wide excision of Right breast lesion with attempted sentinel node biopsy followed by axillary dissection  . COLONOSCOPY N/A 02/23/2015   Procedure: COLONOSCOPY;  Surgeon: Manya Silvas, MD;  Location: [facility] ENDOSCOPY;  Service: Endoscopy;  Laterality: N/A;   Needs early AM - IDDM  . DILATION AND CURETTAGE OF UTERUS     Dr Vernie Ammons  . PORTACATH PLACEMENT  11/02/2013   removed 11/02/2014.    FAMILY HISTORY :   Family History  Problem Relation Age of Onset  . Diabetes Mother   . Breast cancer Neg Hx   . Ovarian cancer Neg Hx   . Colon cancer Neg Hx     SOCIAL HISTORY:   Social History  Substance Use Topics  . Smoking status: Never Smoker  . Smokeless tobacco: Never Used  . Alcohol use No    ALLERGIES:  has no  active allergies.  MEDICATIONS:  Current Outpatient Prescriptions  Medication Sig Dispense Refill  . atorvastatin (LIPITOR) 40 MG tablet Take 40 mg by mouth daily.    . celecoxib (CELEBREX) 200 MG capsule take 1 tablet daily  2  . gabapentin (NEURONTIN) 300 MG capsule Take 300 mg by mouth 2 (two) times daily.    . LUTEIN-ZEAXANTHIN PO Take 1 tablet by mouth daily.     . metFORMIN (GLUCOPHAGE) 500 MG tablet TAKE 1 TABLET(500 MG) BY MOUTH TWICE DAILY    . Multiple Vitamins-Minerals (MULTIVITAMIN WITH MINERALS) tablet Take 1 tablet by  mouth daily.    . Omega-3 Fatty Acids (FISH OIL PO) Take by mouth.    Marland Kitchen omeprazole (PRILOSEC) 20 MG capsule Take 20 mg by mouth daily.    Marland Kitchen telmisartan-hydrochlorothiazide (MICARDIS HCT) 40-12.5 MG tablet TAKE 1 TABLET BY MOUTH ONCE DAILY    . acetaminophen (TYLENOL) 325 MG tablet Take 650 mg by mouth every 6 (six) hours as needed for moderate pain.     . clobetasol ointment (TEMOVATE) 8.46 % Apply 1 application topically once a week.  0  . Magnesium 250 MG TABS Take 1 tablet by mouth 2 (two) times daily.      No current facility-administered medications for this visit.    Facility-Administered Medications Ordered in Other Visits  Medication Dose Route Frequency Provider Last Rate Last Dose  . heparin lock flush 100 unit/mL  500 Units Intravenous Once Choksi, Janak, MD      . sodium chloride 0.9 % injection 10 mL  10 mL Intravenous PRN Choksi, Janak, MD      . sodium chloride 0.9 % injection 10 mL  10 mL Intravenous PRN Forest Gleason, MD   10 mL at 10/26/14 0940    PHYSICAL EXAMINATION: ECOG PERFORMANCE STATUS: 0 - Asymptomatic  BP (!) 153/101   Pulse (!) 103   Temp 97.8 F (36.6 C) (Tympanic)   Resp 20   Ht 5' 2" (1.575 m)   Wt 254 lb (115.2 kg)   BMI 46.46 kg/m   Filed Weights   11/15/16 1005  Weight: 254 lb (115.2 kg)    GENERAL: Well-nourished well-developed; Alert, no distress and comfortable.   Alone EYES: no pallor or icterus OROPHARYNX: no thrush or ulceration; good dentition  NECK: supple, no masses felt LYMPH:  no palpable lymphadenopathy in the cervical, axillary or inguinal regions LUNGS: clear to auscultation and  No wheeze or crackles HEART/CVS: regular rate & rhythm and no murmurs; No lower extremity edema ABDOMEN:abdomen soft, non-tender and normal bowel sounds Musculoskeletal:no cyanosis of digits and no clubbing  PSYCH: alert & oriented x 3 with fluent speech NEURO: no focal motor/sensory deficits SKIN:  no rashes or significant lesions .Right and left  BREAST exam [in the presence of nurse]- no unusual skin changes or dominant masses felt. Surgical scars noted.      LABORATORY DATA:  I have reviewed the data as listed    Component Value Date/Time   NA 139 11/13/2016 0901   NA 138 04/06/2014 1328   K 3.7 11/13/2016 0901   K 4.1 04/06/2014 1328   CL 100 (L) 11/13/2016 0901   CL 101 04/06/2014 1328   CO2 28 11/13/2016 0901   CO2 30 04/06/2014 1328   GLUCOSE 124 (H) 11/13/2016 0901   GLUCOSE 105 (H) 04/06/2014 1328   BUN 12 11/13/2016 0901   BUN 8 04/06/2014 1328   CREATININE 0.65 11/13/2016 0901   CREATININE 0.62 04/06/2014 1328  CALCIUM 9.7 11/13/2016 0901   CALCIUM 8.5 04/06/2014 1328   PROT 7.6 11/13/2016 0901   PROT 6.7 04/06/2014 1328   ALBUMIN 4.2 11/13/2016 0901   ALBUMIN 3.3 (L) 04/06/2014 1328   AST 20 11/13/2016 0901   AST 18 04/06/2014 1328   ALT 16 11/13/2016 0901   ALT 32 04/06/2014 1328   ALKPHOS 113 11/13/2016 0901   ALKPHOS 118 (H) 04/06/2014 1328   BILITOT 0.4 11/13/2016 0901   BILITOT 0.4 04/06/2014 1328   GFRNONAA >60 11/13/2016 0901   GFRNONAA >60 04/06/2014 1328   GFRNONAA >60 12/01/2013 0926   GFRAA >60 11/13/2016 0901   GFRAA >60 04/06/2014 1328   GFRAA >60 12/01/2013 0926    No results found for: SPEP, UPEP  Lab Results  Component Value Date   WBC 13.1 (H) 11/13/2016   NEUTROABS 9.2 (H) 11/13/2016   HGB 12.9 11/13/2016   HCT 36.8 11/13/2016   MCV 89.1 11/13/2016   PLT 396 11/13/2016      Chemistry      Component Value Date/Time   NA 139 11/13/2016 0901   NA 138 04/06/2014 1328   K 3.7 11/13/2016 0901   K 4.1 04/06/2014 1328   CL 100 (L) 11/13/2016 0901   CL 101 04/06/2014 1328   CO2 28 11/13/2016 0901   CO2 30 04/06/2014 1328   BUN 12 11/13/2016 0901   BUN 8 04/06/2014 1328   CREATININE 0.65 11/13/2016 0901   CREATININE 0.62 04/06/2014 1328      Component Value Date/Time   CALCIUM 9.7 11/13/2016 0901   CALCIUM 8.5 04/06/2014 1328   ALKPHOS 113 11/13/2016 0901    ALKPHOS 118 (H) 04/06/2014 1328   AST 20 11/13/2016 0901   AST 18 04/06/2014 1328   ALT 16 11/13/2016 0901   ALT 32 04/06/2014 1328   BILITOT 0.4 11/13/2016 0901   BILITOT 0.4 04/06/2014 1328     IMPRESSION: No evidence of recurrent or metastatic disease within the chest, abdomen, or pelvis.  4.3 cm benign-appearing right ovarian cyst. Recommend followup with pelvic ultrasound in 6 months. This recommendation follows ACR consensus guidelines: White Paper of the ACR Incidental Findings Committee II on Adnexal Findings. J Am Coll Radiol [phone].  Other incidental findings described above.   Electronically Signed   By: Earle Gell M.D.   On: 11/13/2016 12:12   RADIOGRAPHIC STUDIES: I have personally reviewed the radiological images as listed and agreed with the findings in the report. No results found.   ASSESSMENT & PLAN:  Carcinoma of lower-inner quadrant of right breast in female, estrogen receptor negative ([place]) Breast cancer stage III triple negative. Clinical trial  S/p adjuvant chemo with dose dense Adriamycin followed by Taxol with carboplatin  Clinically no evidence of recurrence. SEP 2018- CT scan- C/A/P- ned. Mammogram 2018- NED. # CBC normal CMP- normal.   # Right ovarian cyst-likely benign followed by gynecology.  # Patient's screened for remember study; she has good memory. Not a candidate for the trial.  # Follow-up in 6 months with CBC CMP and tumor markers.   Cc; Dr.Byrnett.    Orders Placed This Encounter  Procedures  . CBC with Differential    Standing Status:   Future    Standing Expiration Date:   11/15/2017  . Comprehensive metabolic panel    Standing Status:   Future    Standing Expiration Date:   11/15/2017  . Cancer antigen 27.29    Standing Status:   Future    Standing  Expiration Date:   11/15/2017   All questions were answered. The patient knows to call the clinic with any problems, questions or concerns.      Cammie Sickle, MD 11/24/2016 7:17 PM

## 2017-02-10 ENCOUNTER — Ambulatory Visit: Payer: BLUE CROSS/BLUE SHIELD | Admitting: Radiation Oncology

## 2017-02-13 ENCOUNTER — Other Ambulatory Visit: Payer: Self-pay

## 2017-02-13 ENCOUNTER — Ambulatory Visit
Admission: RE | Admit: 2017-02-13 | Discharge: 2017-02-13 | Disposition: A | Discharge status: Home / Self Care | Payer: BLUE CROSS/BLUE SHIELD | Source: Ambulatory Visit | Attending: Radiation Oncology | Admitting: Radiation Oncology

## 2017-02-13 ENCOUNTER — Encounter: Payer: Self-pay | Admitting: Radiation Oncology

## 2017-02-13 VITALS — BP 148/85 | HR 89 | Temp 96.9°F | Resp 20 | Wt 254.6 lb

## 2017-02-13 DIAGNOSIS — Z923 Personal history of irradiation: Secondary | ICD-10-CM | POA: Insufficient documentation

## 2017-02-13 DIAGNOSIS — Z853 Personal history of malignant neoplasm of breast: Secondary | ICD-10-CM | POA: Insufficient documentation

## 2017-02-13 DIAGNOSIS — C50311 Malignant neoplasm of lower-inner quadrant of right female breast: Secondary | ICD-10-CM

## 2017-02-13 DIAGNOSIS — Z171 Estrogen receptor negative status [ER-]: Secondary | ICD-10-CM

## 2017-02-13 NOTE — Progress Notes (Signed)
Radiation Oncology Follow up Note  Name: Felicia Kidd   Date:   02/13/2017 MRN:  [id number] DOB: 23-May-1960    This 56 y.o. female presents to the clinic today for 3 year follow-up status post radiation therapy to her right breast for stage IIIa invasive mammary carcinoma.Marland Kitchen  REFERRING PROVIDER: Glendon Axe, MD  HPI: Patient is a 56 year old female now out 3 years having completed radiation therapy to her right breast and peripheral lymphatics for stage IIIa (T1 CN IIa M0) invasive mammary carcinoma with 6 of 14 lymph nodes positive for metastatic disease. Seen today in routine follow-up she continues to do well. She specifically denies breast tenderness cough or bone pain or any swelling of her upper extremities.. She had mammograms back in August which were fine BI-RADS 2 which I have reviewed. She also had chest abdomen and pelvic CT scans in September which showed no evidence of recurrent or metastatic disease. She is triple negative and is not on antiestrogen therapy.   COMPLICATIONS OF TREATMENT: none  FOLLOW UP COMPLIANCE: keeps appointments   PHYSICAL EXAM:  BP (!) 148/85   Pulse 89   Temp (!) 96.9 F (36.1 C)   Resp 20   Wt 254 lb 10.1 oz (115.5 kg)   BMI 46.57 kg/m  Lungs are clear to A&P cardiac examination essentially unremarkable with regular rate and rhythm. No dominant mass or nodularity is noted in either breast in 2 positions examined. Incision is well-healed. No axillary or supraclavicular adenopathy is appreciated. Cosmetic result is excellent. No evidence of lymphedema in either upper extremity is noted. Well-developed well-nourished patient in NAD. HEENT reveals PERLA, EOMI, discs not visualized.  Oral cavity is clear. No oral mucosal lesions are identified. Neck is clear without evidence of cervical or supraclavicular adenopathy. Lungs are clear to A&P. Cardiac examination is essentially unremarkable with regular rate and rhythm without murmur rub or thrill.  Abdomen is benign with no organomegaly or masses noted. Motor sensory and DTR levels are equal and symmetric in the upper and lower extremities. Cranial nerves II through XII are grossly intact. Proprioception is intact. No peripheral adenopathy or edema is identified. No motor or sensory levels are noted. Crude visual fields are within normal range.  RADIOLOGY RESULTS: CT scans mammograms are all reviewed and compatible above-stated findings  PLAN: Present time patient continues to do well with no evidence of disease. I'm please with her overall progress. I have asked to see her back in 1 year for follow-up. She knows to call sooner with any concerns.  I would like to take this opportunity to thank you for allowing me to participate in the care of your patient.Armstead Peaks., MD

## 2017-04-15 ENCOUNTER — Encounter: Payer: BLUE CROSS/BLUE SHIELD | Admitting: Obstetrics and Gynecology

## 2017-05-16 ENCOUNTER — Inpatient Hospital Stay (HOSPITAL_BASED_OUTPATIENT_CLINIC_OR_DEPARTMENT_OTHER): Payer: BLUE CROSS/BLUE SHIELD | Admitting: Internal Medicine

## 2017-05-16 ENCOUNTER — Inpatient Hospital Stay: Payer: BLUE CROSS/BLUE SHIELD | Attending: Internal Medicine

## 2017-05-16 VITALS — BP 163/90 | HR 98 | Temp 97.0°F | Resp 18 | Ht 62.0 in | Wt 257.4 lb

## 2017-05-16 DIAGNOSIS — E785 Hyperlipidemia, unspecified: Secondary | ICD-10-CM | POA: Diagnosis not present

## 2017-05-16 DIAGNOSIS — Z853 Personal history of malignant neoplasm of breast: Secondary | ICD-10-CM | POA: Diagnosis not present

## 2017-05-16 DIAGNOSIS — Z79899 Other long term (current) drug therapy: Secondary | ICD-10-CM

## 2017-05-16 DIAGNOSIS — Z171 Estrogen receptor negative status [ER-]: Secondary | ICD-10-CM | POA: Diagnosis not present

## 2017-05-16 DIAGNOSIS — Z9221 Personal history of antineoplastic chemotherapy: Secondary | ICD-10-CM

## 2017-05-16 DIAGNOSIS — N83201 Unspecified ovarian cyst, right side: Secondary | ICD-10-CM

## 2017-05-16 DIAGNOSIS — I10 Essential (primary) hypertension: Secondary | ICD-10-CM | POA: Insufficient documentation

## 2017-05-16 DIAGNOSIS — J45909 Unspecified asthma, uncomplicated: Secondary | ICD-10-CM | POA: Diagnosis not present

## 2017-05-16 DIAGNOSIS — E079 Disorder of thyroid, unspecified: Secondary | ICD-10-CM | POA: Insufficient documentation

## 2017-05-16 DIAGNOSIS — C50311 Malignant neoplasm of lower-inner quadrant of right female breast: Secondary | ICD-10-CM

## 2017-05-16 LAB — CBC WITH DIFFERENTIAL/PLATELET
BASOS ABS: 0.1 10*3/uL (ref 0–0.1)
BASOS PCT: 1 %
EOS ABS: 0.3 10*3/uL (ref 0–0.7)
Eosinophils Relative: 3 %
HEMATOCRIT: 39.2 % (ref 35.0–47.0)
Hemoglobin: 13.4 g/dL (ref 12.0–16.0)
Lymphocytes Relative: 25 %
Lymphs Abs: 3.1 10*3/uL (ref 1.0–3.6)
MCH: 30.8 pg (ref 26.0–34.0)
MCHC: 34.2 g/dL (ref 32.0–36.0)
MCV: 90.1 fL (ref 80.0–100.0)
MONO ABS: 0.8 10*3/uL (ref 0.2–0.9)
MONOS PCT: 6 %
NEUTROS ABS: 7.9 10*3/uL — AB (ref 1.4–6.5)
Neutrophils Relative %: 65 %
Platelets: 422 10*3/uL (ref 150–440)
RBC: 4.35 MIL/uL (ref 3.80–5.20)
RDW: 12.5 % (ref 11.5–14.5)
WBC: 12.2 10*3/uL — ABNORMAL HIGH (ref 3.6–11.0)

## 2017-05-16 LAB — COMPREHENSIVE METABOLIC PANEL
ALBUMIN: 3.8 g/dL (ref 3.5–5.0)
ALT: 17 U/L (ref 14–54)
ANION GAP: 8 (ref 5–15)
AST: 23 U/L (ref 15–41)
Alkaline Phosphatase: 124 U/L (ref 38–126)
BUN: 11 mg/dL (ref 6–20)
CO2: 28 mmol/L (ref 22–32)
Calcium: 9.5 mg/dL (ref 8.9–10.3)
Chloride: 101 mmol/L (ref 101–111)
Creatinine, Ser: 0.52 mg/dL (ref 0.44–1.00)
GFR calc Af Amer: >60 mL/min (ref >60)
GFR calc non Af Amer: >60 mL/min (ref >60)
GLUCOSE: 125 mg/dL — AB (ref 65–99)
POTASSIUM: 3.8 mmol/L (ref 3.5–5.1)
SODIUM: 137 mmol/L (ref 135–145)
TOTAL PROTEIN: 7.5 g/dL (ref 6.5–8.1)
Total Bilirubin: 0.3 mg/dL (ref 0.3–1.2)

## 2017-05-16 NOTE — Progress Notes (Signed)
[facility] OFFICE PROGRESS NOTE  Patient Care Team: Glendon Axe, MD as PCP - General (Internal Medicine) Ammie Dalton, Okey Regal, MD (Unknown Physician Specialty) Bary Castilla Forest Gleason, MD (General Surgery)  Cancer Staging No matching staging information was found for the patient.   Oncology History   1. Carcinoma of breast pT1c oN2a Mo  stage  IIIa. extensive lymphovascular invasion 6/14 lymph nodes uninvolved with extra capsular  invasion.Marland Kitchen     2.NEGATIVE FOR BRCA 1  AND 2 MUTATION  estrogen and progesterone receptor negative HER-2/neu receptor negative (triple negative disease) diagnosis in July of 2015.  Status post lumpectomy and axillary node dissection, 2. Started on chemotherapy with NSABP protocol is dose dense Cytoxan Adriamycin August of 2015 3.as per protocol patient was switched  to  carboplatinum and Taxol fromOctober 28, 2015 4.Patient has finished carboplatinum and Taxol in March 25, 2014 Would start radiation therapy to the breast in February of 2016     Carcinoma of lower-inner quadrant of right breast in female, estrogen receptor negative ([place])     INTERVAL HISTORY:  Felicia Kidd 57 y.o.  female pleasant patient above history of Stage III triple negative breast cancer currently on surveillance Is here for follow-up.   Patient has a good appetite.  Denies any weight loss.  Denies any unusual headaches or falls.  Denies any unusual lumps or bumps.  REVIEW OF SYSTEMS:  A complete 10 point review of system is done which is negative except mentioned above/history of present illness.   PAST MEDICAL HISTORY :  Past Medical History:  Diagnosis Date  . Allergy   . Asthma   . Breast cancer ([place]) 2015    right breast lumpectomy with chemo and rad tx  . Breast cancer of lower-inner quadrant of right female breast ([place]) 08/2013   Triple negative, T1c, N2a.(6/14 nodes +) Treated with Cytoxan and Adriamycin in a dose dense fashion followed by  carbotaxol.    . Bronchitis   . Diabetes mellitus without complication ([place])   . Hyperlipidemia   . Hypertension   . Personal history of chemotherapy 10/2013   right breast ca  . Personal history of radiation therapy 04/2014   right breast ca  . Thyroid disease     PAST SURGICAL HISTORY :   Past Surgical History:  Procedure Laterality Date  . BREAST BIOPSY Right 09/07/2013   invasive mammary carcinoma  . 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 Clear margins  . BREAST SURGERY Right 09-27-13   Wide excision of Right breast lesion with attempted sentinel node biopsy followed by axillary dissection  . COLONOSCOPY N/A 02/23/2015   Procedure: COLONOSCOPY;  Surgeon: Felicia Silvas, MD;  Location: [facility] ENDOSCOPY;  Service: Endoscopy;  Laterality: N/A;   Needs early AM - IDDM  . DILATION AND CURETTAGE OF UTERUS     Dr Felicia Kidd  . PORTACATH PLACEMENT  11/02/2013   removed 11/02/2014.    FAMILY HISTORY :   Family History  Problem Relation Age of Onset  . Diabetes Mother   . Breast cancer Neg Hx   . Ovarian cancer Neg Hx   . Colon cancer Neg Hx     SOCIAL HISTORY:   Social History   Tobacco Use  . Smoking status: Never Smoker  . Smokeless tobacco: Never Used  Substance Use Topics  . Alcohol use: No  . Drug use: No    ALLERGIES:  has no active allergies.  MEDICATIONS:  Current Outpatient Medications  Medication Sig Dispense Refill  . celecoxib (CELEBREX) 200 MG capsule take 1 tablet daily  2  . fluticasone (FLONASE SENSIMIST) 27.5 MCG/SPRAY nasal spray Place into the nose.    . gabapentin (NEURONTIN) 300 MG capsule Take 300 mg by mouth 2 (two) times daily.    . Magnesium 250 MG TABS Take 1 tablet by mouth 2 (two) times daily.     . metFORMIN (GLUCOPHAGE) 500 MG tablet TAKE 1 TABLET(500 MG) BY MOUTH TWICE DAILY    . montelukast (SINGULAIR) 10 MG tablet TAKE 1 TABLET(10 MG) BY MOUTH EVERY NIGHT    . Multiple  Vitamins-Minerals (MULTIVITAMIN WITH MINERALS) tablet Take 1 tablet by mouth daily.    . Omega-3 Fatty Acids (FISH OIL PO) Take by mouth.    Marland Kitchen omeprazole (PRILOSEC) 20 MG capsule Take 20 mg by mouth daily.    Marland Kitchen telmisartan-hydrochlorothiazide (MICARDIS HCT) 40-12.5 MG tablet TAKE 1 TABLET BY MOUTH ONCE DAILY    . acetaminophen (TYLENOL) 325 MG tablet Take 650 mg by mouth every 6 (six) hours as needed for moderate pain.     Marland Kitchen atorvastatin (LIPITOR) 40 MG tablet Take 40 mg by mouth daily.    . clobetasol ointment (TEMOVATE) 6.86 % Apply 1 application topically once a week.  0  . LUTEIN-ZEAXANTHIN PO Take 1 tablet by mouth daily.      No current facility-administered medications for this visit.    Facility-Administered Medications Ordered in Other Visits  Medication Dose Route Frequency Provider Last Rate Last Dose  . heparin lock flush 100 unit/mL  500 Units Intravenous Once Choksi, Janak, MD      . sodium chloride 0.9 % injection 10 mL  10 mL Intravenous PRN Choksi, Janak, MD      . sodium chloride 0.9 % injection 10 mL  10 mL Intravenous PRN Forest Gleason, MD   10 mL at 10/26/14 0940    PHYSICAL EXAMINATION: ECOG PERFORMANCE STATUS: 0 - Asymptomatic  BP (!) 163/90 (BP Location: Left Arm, Patient Position: Sitting)   Pulse 98   Temp (!) 97 F (36.1 C) (Tympanic)   Resp 18   Ht '5\' 2"'  (1.575 m)   Wt 257 lb 6.4 oz (116.8 kg)   SpO2 99%   BMI 47.08 kg/m   Filed Weights   05/16/17 1212  Weight: 257 lb 6.4 oz (116.8 kg)    GENERAL: Well-nourished well-developed; Alert, no distress and comfortable.   Alone EYES: no pallor or icterus OROPHARYNX: no thrush or ulceration; good dentition  NECK: supple, no masses felt LYMPH:  no palpable lymphadenopathy in the cervical, axillary or inguinal regions LUNGS: clear to auscultation and  No wheeze or crackles HEART/CVS: regular rate & rhythm and no murmurs; No lower extremity edema ABDOMEN:abdomen soft, non-tender and normal bowel  sounds Musculoskeletal:no cyanosis of digits and no clubbing  PSYCH: alert & oriented x 3 with fluent speech NEURO: no focal motor/sensory deficits SKIN:  no rashes or significant lesions .Right and left BREAST exam [in the presence of nurse]- no unusual skin changes or dominant masses felt. Surgical scars noted.      LABORATORY DATA:  I have reviewed the data as listed    Component Value Date/Time   NA 137 05/16/2017 1134   NA 138 04/06/2014 1328   K 3.8 05/16/2017 1134   K 4.1 04/06/2014 1328   CL 101 05/16/2017 1134   CL 101 04/06/2014 1328   CO2 28 05/16/2017 1134   CO2  30 04/06/2014 1328   GLUCOSE 125 (H) 05/16/2017 1134   GLUCOSE 105 (H) 04/06/2014 1328   BUN 11 05/16/2017 1134   BUN 8 04/06/2014 1328   CREATININE 0.52 05/16/2017 1134   CREATININE 0.62 04/06/2014 1328   CALCIUM 9.5 05/16/2017 1134   CALCIUM 8.5 04/06/2014 1328   PROT 7.5 05/16/2017 1134   PROT 6.7 04/06/2014 1328   ALBUMIN 3.8 05/16/2017 1134   ALBUMIN 3.3 (L) 04/06/2014 1328   AST 23 05/16/2017 1134   AST 18 04/06/2014 1328   ALT 17 05/16/2017 1134   ALT 32 04/06/2014 1328   ALKPHOS 124 05/16/2017 1134   ALKPHOS 118 (H) 04/06/2014 1328   BILITOT 0.3 05/16/2017 1134   BILITOT 0.4 04/06/2014 1328   GFRNONAA >60 05/16/2017 1134   GFRNONAA >60 04/06/2014 1328   GFRNONAA >60 12/01/2013 0926   GFRAA >60 05/16/2017 1134   GFRAA >60 04/06/2014 1328   GFRAA >60 12/01/2013 0926    No results found for: SPEP, UPEP  Lab Results  Component Value Date   WBC 12.2 (H) 05/16/2017   NEUTROABS 7.9 (H) 05/16/2017   HGB 13.4 05/16/2017   HCT 39.2 05/16/2017   MCV 90.1 05/16/2017   PLT 422 05/16/2017      Chemistry      Component Value Date/Time   NA 137 05/16/2017 1134   NA 138 04/06/2014 1328   K 3.8 05/16/2017 1134   K 4.1 04/06/2014 1328   CL 101 05/16/2017 1134   CL 101 04/06/2014 1328   CO2 28 05/16/2017 1134   CO2 30 04/06/2014 1328   BUN 11 05/16/2017 1134   BUN 8 04/06/2014 1328    CREATININE 0.52 05/16/2017 1134   CREATININE 0.62 04/06/2014 1328      Component Value Date/Time   CALCIUM 9.5 05/16/2017 1134   CALCIUM 8.5 04/06/2014 1328   ALKPHOS 124 05/16/2017 1134   ALKPHOS 118 (H) 04/06/2014 1328   AST 23 05/16/2017 1134   AST 18 04/06/2014 1328   ALT 17 05/16/2017 1134   ALT 32 04/06/2014 1328   BILITOT 0.3 05/16/2017 1134   BILITOT 0.4 04/06/2014 1328     IMPRESSION: No evidence of recurrent or metastatic disease within the chest, abdomen, or pelvis.  4.3 cm benign-appearing right ovarian cyst. Recommend followup with pelvic ultrasound in 6 months. This recommendation follows ACR consensus guidelines: White Paper of the ACR Incidental Findings Committee II on Adnexal Findings. J Am Coll Radiol [phone].  Other incidental findings described above.   Electronically Signed   By: Earle Gell M.D.   On: 11/13/2016 12:12   RADIOGRAPHIC STUDIES: I have personally reviewed the radiological images as listed and agreed with the findings in the report. No results found.   ASSESSMENT & PLAN:  Carcinoma of lower-inner quadrant of right breast in female, estrogen receptor negative ([place]) Breast cancer stage III triple negative [2015]. Clinical trial  S/p adjuvant chemo with dose dense Adriamycin followed by Taxol with carboplatin.   # Clinically no evidence of recurrence. SEP 2018- CT scan- C/A/P- ned. Mammogram 2018- NED; discussed that I would recommend scans only as needed/based on clinical symptoms.    # Right ovarian cyst-likely benign followed by gynecology.  # head cold-recommend symptomatic treatment with antihistamines.  # Follow-up in 6 months with CBC CMP and tumor markers.   Cc; Dr.Byrnett.    Orders Placed This Encounter  Procedures  . CBC with Differential/Platelet    Standing Status:   Future    Standing  Expiration Date:   05/17/2018  . Cancer antigen 27.29    Standing Status:   Future    Standing Expiration Date:    05/17/2018  . Comprehensive metabolic panel    Standing Status:   Future    Standing Expiration Date:   05/17/2018   All questions were answered. The patient knows to call the clinic with any problems, questions or concerns.      Cammie Sickle, MD 05/27/2017 8:03 AM

## 2017-05-16 NOTE — Progress Notes (Signed)
No new changes noted today 

## 2017-05-16 NOTE — Assessment & Plan Note (Addendum)
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 Clinical trial  S/p adjuvant chemo with dose dense Adriamycin followed by Taxol with carboplatin.   # Clinically no evidence of recurrence. SEP 2018- CT scan- C/A/P- ned. Mammogram 2018- NED; discussed that I would recommend scans only as needed/based on clinical symptoms.    # Right ovarian cyst-likely benign followed by gynecology.  # head cold-recommend symptomatic treatment with antihistamines.  # Follow-up in 6 months with CBC CMP and tumor markers.   Cc; Dr.Byrnett.

## 2017-05-17 LAB — CANCER ANTIGEN 27.29: CA 27.29: 14.1 U/mL (ref 0.0–38.6)

## 2017-09-03 ENCOUNTER — Other Ambulatory Visit: Payer: Self-pay

## 2017-09-03 DIAGNOSIS — C50311 Malignant neoplasm of lower-inner quadrant of right female breast: Secondary | ICD-10-CM

## 2017-09-03 DIAGNOSIS — Z171 Estrogen receptor negative status [ER-]: Principal | ICD-10-CM

## 2017-10-15 ENCOUNTER — Ambulatory Visit
Admission: RE | Admit: 2017-10-15 | Discharge: 2017-10-15 | Disposition: A | Discharge status: Home / Self Care | Payer: BLUE CROSS/BLUE SHIELD | Source: Ambulatory Visit | Attending: General Surgery | Admitting: General Surgery

## 2017-10-15 DIAGNOSIS — C50311 Malignant neoplasm of lower-inner quadrant of right female breast: Secondary | ICD-10-CM | POA: Diagnosis present

## 2017-10-15 DIAGNOSIS — Z171 Estrogen receptor negative status [ER-]: Principal | ICD-10-CM

## 2017-10-23 ENCOUNTER — Ambulatory Visit: Payer: BLUE CROSS/BLUE SHIELD | Admitting: General Surgery

## 2017-10-28 ENCOUNTER — Encounter: Payer: Self-pay | Admitting: General Surgery

## 2017-10-28 ENCOUNTER — Ambulatory Visit (INDEPENDENT_AMBULATORY_CARE_PROVIDER_SITE_OTHER): Payer: BLUE CROSS/BLUE SHIELD | Admitting: General Surgery

## 2017-10-28 VITALS — BP 134/78 | HR 85 | Resp 14 | Ht 60.0 in | Wt 244.0 lb

## 2017-10-28 DIAGNOSIS — C50311 Malignant neoplasm of lower-inner quadrant of right female breast: Secondary | ICD-10-CM

## 2017-10-28 DIAGNOSIS — Z171 Estrogen receptor negative status [ER-]: Secondary | ICD-10-CM | POA: Diagnosis not present

## 2017-10-28 NOTE — Patient Instructions (Signed)
The patient has been asked to return to the office in one year with a bilateral diagnostic mammogram.The patient is aware to call back for any questions or concerns. 

## 2017-10-28 NOTE — Progress Notes (Signed)
Patient ID: Felicia Kidd, female   DOB: 1960-07-29, 57 y.o.   MRN: [id number]  Chief Complaint  Patient presents with  . Follow-up    HPI Felicia Kidd is a 57 y.o. female who presents for a breast evaluation. The most recent mammogram was done on 10/15/2017.  Patient does perform regular self breast checks and gets regular mammograms done.    HPI  Past Medical History:  Diagnosis Date  . Allergy   . Asthma   . Breast cancer ([place]) 2015    right breast lumpectomy with chemo and rad tx  . Breast cancer of lower-inner quadrant of right female breast ([place]) 08/2013   Triple negative, T1c, N2a.(6/14 nodes +) Treated with Cytoxan and Adriamycin in a dose dense fashion followed by carbotaxol.    . Bronchitis   . Diabetes mellitus without complication ([place])   . Hyperlipidemia   . Hypertension   . Personal history of chemotherapy 10/2013   right breast ca  . Personal history of radiation therapy 04/2014   right breast ca  . Thyroid disease     Past Surgical History:  Procedure Laterality Date  . BREAST BIOPSY Right 09/07/2013   invasive mammary carcinoma  . 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 Clear margins  . BREAST SURGERY Right 09-27-13   Wide excision of Right breast lesion with attempted sentinel node biopsy followed by axillary dissection  . COLONOSCOPY N/A 02/23/2015   Procedure: COLONOSCOPY;  Surgeon: Manya Silvas, MD;  Location[facility] ENDOSCOPY;  Service: Endoscopy;  Laterality: N/A;   Needs early AM - IDDM  . DILATION AND CURETTAGE OF UTERUS     Dr Vernie Ammons  . PORTACATH PLACEMENT  11/02/2013   removed 11/02/2014.    Family History  Problem Relation Age of Onset  . Diabetes Mother   . Breast cancer Neg Hx   . Ovarian cancer Neg Hx   . Colon cancer Neg Hx     Social History Social History   Tobacco Use  . Smoking status: Never Smoker  . Smokeless tobacco: Never Used  Substance Use Topics   . Alcohol use: No  . Drug use: No    No Known Allergies  Current Outpatient Medications  Medication Sig Dispense Refill  . acetaminophen (TYLENOL) 325 MG tablet Take 650 mg by mouth every 6 (six) hours as needed for moderate pain.     . celecoxib (CELEBREX) 200 MG capsule take 1 tablet daily  2  . clobetasol ointment (TEMOVATE) 9.35 % Apply 1 application topically once a week.  0  . gabapentin (NEURONTIN) 300 MG capsule Take 300 mg by mouth 2 (two) times daily.    . Loratadine (CLARITIN) 10 MG CAPS Take by mouth.    . LUTEIN-ZEAXANTHIN PO Take 1 tablet by mouth daily.     . Magnesium 250 MG TABS Take 1 tablet by mouth 2 (two) times daily.     . metFORMIN (GLUCOPHAGE) 500 MG tablet TAKE 1 TABLET(500 MG) BY MOUTH TWICE DAILY    . montelukast (SINGULAIR) 10 MG tablet TAKE 1 TABLET(10 MG) BY MOUTH EVERY NIGHT    . Multiple Vitamins-Minerals (MULTIVITAMIN WITH MINERALS) tablet Take 1 tablet by mouth daily.    . Omega-3 Fatty Acids (FISH OIL PO) Take by mouth.    Marland Kitchen omeprazole (PRILOSEC) 20 MG capsule Take 20 mg by mouth 3 (three) times a week.     . telmisartan-hydrochlorothiazide (MICARDIS HCT) 40-12.5 MG tablet  TAKE 1 TABLET BY MOUTH ONCE DAILY    . fluticasone (FLONASE SENSIMIST) 27.5 MCG/SPRAY nasal spray Place into the nose.     No current facility-administered medications for this visit.    Facility-Administered Medications Ordered in Other Visits  Medication Dose Route Frequency Provider Last Rate Last Dose  . heparin lock flush 100 unit/mL  500 Units Intravenous Once Choksi, Janak, MD      . sodium chloride 0.9 % injection 10 mL  10 mL Intravenous PRN Choksi, Janak, MD      . sodium chloride 0.9 % injection 10 mL  10 mL Intravenous PRN Forest Gleason, MD   10 mL at 10/26/14 0940    Review of Systems Review of Systems  Constitutional: Negative.   Respiratory: Negative.   Cardiovascular: Negative.     Blood pressure 134/78, pulse 85, resp. rate 14, height 5' (1.524 m), weight  244 lb (110.7 kg).  Physical Exam Physical Exam  Constitutional: She is oriented to person, place, and time. She appears well-developed and well-nourished.  Eyes: Conjunctivae are normal. No scleral icterus.  Neck: Neck supple.  Cardiovascular: Normal rate, regular rhythm and normal heart sounds.  Pulmonary/Chest: Effort normal and breath sounds normal. Right breast exhibits no inverted nipple, no mass, no nipple discharge, no skin change and no tenderness. Left breast exhibits no inverted nipple, no mass, no nipple discharge, no skin change and no tenderness.    Lymphadenopathy:    She has no cervical adenopathy.    She has no axillary adenopathy.  Neurological: She is alert and oriented to person, place, and time.  Skin: Skin is warm and dry.    Data Reviewed Bilateral diagnostic mammogram dated October 15, 2017 was reviewed.  Postsurgical changes.  Assessment    No evidence of recurrent disease.    Plan  The patient has been asked to return to the office in one year with a bilateral diagnostic mammogram. The patient is aware to call back for any questions or concerns.    HPI, Physical Exam, Assessment and Plan have been scribed under the direction and in the presence of Hervey Ard, MD.  Gaspar Cola, CMA  I have completed the exam and reviewed the above documentation for accuracy and completeness.  I agree with the above.  Haematologist has been used and any errors in dictation or transcription are unintentional.  Hervey Ard, M.D., F.A.C.S.  Forest Gleason Leeland Lovelady 10/29/2017, 5:39 PM

## 2017-11-14 ENCOUNTER — Other Ambulatory Visit: Payer: BLUE CROSS/BLUE SHIELD

## 2017-11-14 ENCOUNTER — Ambulatory Visit: Payer: BLUE CROSS/BLUE SHIELD | Admitting: Internal Medicine

## 2017-12-01 ENCOUNTER — Encounter: Payer: Self-pay | Admitting: Internal Medicine

## 2017-12-01 ENCOUNTER — Inpatient Hospital Stay: Payer: BLUE CROSS/BLUE SHIELD | Attending: Internal Medicine

## 2017-12-01 ENCOUNTER — Other Ambulatory Visit: Payer: Self-pay

## 2017-12-01 ENCOUNTER — Inpatient Hospital Stay (HOSPITAL_BASED_OUTPATIENT_CLINIC_OR_DEPARTMENT_OTHER): Payer: BLUE CROSS/BLUE SHIELD | Admitting: Internal Medicine

## 2017-12-01 DIAGNOSIS — N83201 Unspecified ovarian cyst, right side: Secondary | ICD-10-CM

## 2017-12-01 DIAGNOSIS — Z171 Estrogen receptor negative status [ER-]: Secondary | ICD-10-CM

## 2017-12-01 DIAGNOSIS — Z9221 Personal history of antineoplastic chemotherapy: Secondary | ICD-10-CM | POA: Insufficient documentation

## 2017-12-01 DIAGNOSIS — Z853 Personal history of malignant neoplasm of breast: Secondary | ICD-10-CM | POA: Diagnosis present

## 2017-12-01 DIAGNOSIS — C50311 Malignant neoplasm of lower-inner quadrant of right female breast: Secondary | ICD-10-CM

## 2017-12-01 LAB — CBC WITH DIFFERENTIAL/PLATELET
BASOS ABS: 0.1 10*3/uL (ref 0–0.1)
BASOS PCT: 1 %
EOS PCT: 3 %
Eosinophils Absolute: 0.3 10*3/uL (ref 0–0.7)
HEMATOCRIT: 40.8 % (ref 35.0–47.0)
Hemoglobin: 13.7 g/dL (ref 12.0–16.0)
LYMPHS PCT: 26 %
Lymphs Abs: 2.9 10*3/uL (ref 1.0–3.6)
MCH: 30.5 pg (ref 26.0–34.0)
MCHC: 33.7 g/dL (ref 32.0–36.0)
MCV: 90.6 fL (ref 80.0–100.0)
Monocytes Absolute: 0.6 10*3/uL (ref 0.2–0.9)
Monocytes Relative: 6 %
NEUTROS ABS: 7.3 10*3/uL — AB (ref 1.4–6.5)
Neutrophils Relative %: 64 %
PLATELETS: 400 10*3/uL (ref 150–440)
RBC: 4.51 MIL/uL (ref 3.80–5.20)
RDW: 13.5 % (ref 11.5–14.5)
WBC: 11.3 10*3/uL — AB (ref 3.6–11.0)

## 2017-12-01 LAB — COMPREHENSIVE METABOLIC PANEL
ALBUMIN: 4.1 g/dL (ref 3.5–5.0)
ALT: 15 U/L (ref 0–44)
ANION GAP: 10 (ref 5–15)
AST: 17 U/L (ref 15–41)
Alkaline Phosphatase: 109 U/L (ref 38–126)
BUN: 11 mg/dL (ref 6–20)
CHLORIDE: 102 mmol/L (ref 98–111)
CO2: 25 mmol/L (ref 22–32)
Calcium: 9.6 mg/dL (ref 8.9–10.3)
Creatinine, Ser: 0.59 mg/dL (ref 0.44–1.00)
GFR calc Af Amer: >60 mL/min (ref >60)
GLUCOSE: 111 mg/dL — AB (ref 70–99)
POTASSIUM: 3.5 mmol/L (ref 3.5–5.1)
Sodium: 137 mmol/L (ref 135–145)
TOTAL PROTEIN: 7.5 g/dL (ref 6.5–8.1)
Total Bilirubin: 0.6 mg/dL (ref 0.3–1.2)

## 2017-12-01 NOTE — Progress Notes (Signed)
[facility] OFFICE PROGRESS NOTE  Patient Care Team: Glendon Axe, MD as PCP - General (Internal Medicine) Ammie Dalton, Okey Regal, MD (Unknown Physician Specialty) Bary Castilla Forest Gleason, MD (General Surgery)  Cancer Staging No matching staging information was found for the patient.   Oncology History   1. Carcinoma of breast pT1c oN2a Mo  stage  IIIa.extensive lymphovascular invasion 6/14 lymph nodes uninvolved with extra capsular  invasion.Marland Kitchen     2.NEGATIVE FOR BRCA 1  AND 2 MUTATION  estrogen and progesterone receptor negative HER-2/neu receptor negative (triple negative disease) diagnosis in July of 2015.  Status post lumpectomy and axillary node dissection, 2. Started on chemotherapy with NSABP protocol is dose dense Cytoxan Adriamycin August of 2015 3.as per protocol patient was switched  to  carboplatinum and Taxol fromOctober 28, 2015 4.Patient has finished carboplatinum and Taxol in March 25, 2014 Would start radiation therapy to the breast in February of 2016 -----------------------------------------------------------------------   DIAGNOSIS: BREAST CANCER  STAGE:  III    ;GOALS: cure  CURRENT/MOST RECENT THERAPY: surveillaince      Carcinoma of lower-inner quadrant of right breast in female, estrogen receptor negative ([place])     INTERVAL HISTORY:  Felicia Kidd 57 y.o.  female pleasant patient above history of Stage III triple negative breast cancer currently on surveillance Is here for follow-up.    Patient was recently evaluated by surgery; recent mammogram negative.  Patient has a good appetite.  Denies any weight loss.  Denies any unusual headaches or falls.  Denies any unusual lumps or bumps.  Review of Systems  Constitutional: Negative for chills, diaphoresis, fever, malaise/fatigue and weight loss.  HENT: Negative for nosebleeds and sore throat.   Eyes: Negative for double vision.  Respiratory: Negative for cough, hemoptysis, sputum  production, shortness of breath and wheezing.   Cardiovascular: Negative for chest pain, palpitations, orthopnea and leg swelling.  Gastrointestinal: Negative for abdominal pain, blood in stool, constipation, diarrhea, heartburn, melena, nausea and vomiting.  Genitourinary: Negative for dysuria, frequency and urgency.  Musculoskeletal: Negative for back pain and joint pain.  Skin: Negative.  Negative for itching and rash.  Neurological: Negative for dizziness, tingling, focal weakness, weakness and headaches.  Endo/Heme/Allergies: Does not bruise/bleed easily.  Psychiatric/Behavioral: Negative for depression. The patient is not nervous/anxious and does not have insomnia.      PAST MEDICAL HISTORY :  Past Medical History:  Diagnosis Date  . Allergy   . Asthma   . Breast cancer ([place]) 2015    right breast lumpectomy with chemo and rad tx  . Breast cancer of lower-inner quadrant of right female breast ([place]) 08/2013   Triple negative, T1c, N2a.(6/14 nodes +) Treated with Cytoxan and Adriamycin in a dose dense fashion followed by carbotaxol.    . Bronchitis   . Diabetes mellitus without complication ([place])   . Hyperlipidemia   . Hypertension   . Personal history of chemotherapy 10/2013   right breast ca  . Personal history of radiation therapy 04/2014   right breast ca  . Thyroid disease     PAST SURGICAL HISTORY :   Past Surgical History:  Procedure Laterality Date  . BREAST BIOPSY Right 09/07/2013   invasive mammary carcinoma  . 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 Clear margins  . BREAST SURGERY Right 09-27-13   Wide excision of Right breast lesion with attempted sentinel node biopsy followed by axillary dissection  . COLONOSCOPY N/A 02/23/2015  Procedure: COLONOSCOPY;  Surgeon: Manya Silvas, MD;  Location: [facility] ENDOSCOPY;  Service: Endoscopy;  Laterality: N/A;   Needs early AM - IDDM  . DILATION AND CURETTAGE  OF UTERUS     Dr Vernie Ammons  . PORTACATH PLACEMENT  11/02/2013   removed 11/02/2014.    FAMILY HISTORY :   Family History  Problem Relation Age of Onset  . Diabetes Mother   . Breast cancer Neg Hx   . Ovarian cancer Neg Hx   . Colon cancer Neg Hx     SOCIAL HISTORY:   Social History   Tobacco Use  . Smoking status: Never Smoker  . Smokeless tobacco: Never Used  Substance Use Topics  . Alcohol use: No  . Drug use: No    ALLERGIES:  has No Known Allergies.  MEDICATIONS:  Current Outpatient Medications  Medication Sig Dispense Refill  . acetaminophen (TYLENOL) 325 MG tablet Take 650 mg by mouth every 6 (six) hours as needed for moderate pain.     . celecoxib (CELEBREX) 200 MG capsule take 1 tablet daily  2  . clobetasol ointment (TEMOVATE) 7.65 % Apply 1 application topically once a week.  0  . gabapentin (NEURONTIN) 300 MG capsule Take 300 mg by mouth 2 (two) times daily.    . Loratadine (CLARITIN) 10 MG CAPS Take by mouth.    . LUTEIN-ZEAXANTHIN PO Take 1 tablet by mouth daily.     . Magnesium 250 MG TABS Take 1 tablet by mouth 2 (two) times daily.     . metFORMIN (GLUCOPHAGE) 500 MG tablet TAKE 1 TABLET(500 MG) BY MOUTH TWICE DAILY    . montelukast (SINGULAIR) 10 MG tablet TAKE 1 TABLET(10 MG) BY MOUTH EVERY NIGHT    . Multiple Vitamins-Minerals (MULTIVITAMIN WITH MINERALS) tablet Take 1 tablet by mouth daily.    . Omega-3 Fatty Acids (FISH OIL PO) Take by mouth.    Marland Kitchen omeprazole (PRILOSEC) 20 MG capsule Take 20 mg by mouth 3 (three) times a week.     . telmisartan-hydrochlorothiazide (MICARDIS HCT) 40-12.5 MG tablet TAKE 1 TABLET BY MOUTH ONCE DAILY    . fluticasone (FLONASE SENSIMIST) 27.5 MCG/SPRAY nasal spray Place into the nose.     No current facility-administered medications for this visit.    Facility-Administered Medications Ordered in Other Visits  Medication Dose Route Frequency Provider Last Rate Last Dose  . heparin lock flush 100 unit/mL  500 Units  Intravenous Once Choksi, Janak, MD      . sodium chloride 0.9 % injection 10 mL  10 mL Intravenous PRN Choksi, Janak, MD      . sodium chloride 0.9 % injection 10 mL  10 mL Intravenous PRN Forest Gleason, MD   10 mL at 10/26/14 0940    PHYSICAL EXAMINATION: ECOG PERFORMANCE STATUS: 0 - Asymptomatic  BP (!) 156/89 (Patient Position: Sitting)   Pulse 90   Temp 97.6 F (36.4 C) (Tympanic)   Resp 20   Ht 5' (1.524 m)   Wt 242 lb (109.8 kg)   BMI 47.26 kg/m   Filed Weights   12/01/17 1333  Weight: 242 lb (109.8 kg)    Physical Exam  Constitutional: She is oriented to person, place, and time and well-developed, well-nourished, and in no distress.  HENT:  Head: Normocephalic and atraumatic.  Mouth/Throat: Oropharynx is clear and moist. No oropharyngeal exudate.  Eyes: Pupils are equal, round, and reactive to light.  Neck: Normal range of motion. Neck supple.  Cardiovascular: Normal rate and  regular rhythm.  Pulmonary/Chest: No respiratory distress. She has no wheezes.  Abdominal: Soft. Bowel sounds are normal. She exhibits no distension and no mass. There is no tenderness. There is no rebound and no guarding.  Musculoskeletal: Normal range of motion. She exhibits no edema or tenderness.  Neurological: She is alert and oriented to person, place, and time.  Skin: Skin is warm.  Psychiatric: Affect normal.        LABORATORY DATA:  I have reviewed the data as listed    Component Value Date/Time   NA 137 12/01/2017 1313   NA 138 04/06/2014 1328   K 3.5 12/01/2017 1313   K 4.1 04/06/2014 1328   CL 102 12/01/2017 1313   CL 101 04/06/2014 1328   CO2 25 12/01/2017 1313   CO2 30 04/06/2014 1328   GLUCOSE 111 (H) 12/01/2017 1313   GLUCOSE 105 (H) 04/06/2014 1328   BUN 11 12/01/2017 1313   BUN 8 04/06/2014 1328   CREATININE 0.59 12/01/2017 1313   CREATININE 0.62 04/06/2014 1328   CALCIUM 9.6 12/01/2017 1313   CALCIUM 8.5 04/06/2014 1328   PROT 7.5 12/01/2017 1313   PROT  6.7 04/06/2014 1328   ALBUMIN 4.1 12/01/2017 1313   ALBUMIN 3.3 (L) 04/06/2014 1328   AST 17 12/01/2017 1313   AST 18 04/06/2014 1328   ALT 15 12/01/2017 1313   ALT 32 04/06/2014 1328   ALKPHOS 109 12/01/2017 1313   ALKPHOS 118 (H) 04/06/2014 1328   BILITOT 0.6 12/01/2017 1313   BILITOT 0.4 04/06/2014 1328   GFRNONAA >60 12/01/2017 1313   GFRNONAA >60 04/06/2014 1328   GFRNONAA >60 12/01/2013 0926   GFRAA >60 12/01/2017 1313   GFRAA >60 04/06/2014 1328   GFRAA >60 12/01/2013 0926    No results found for: SPEP, UPEP  Lab Results  Component Value Date   WBC 11.3 (H) 12/01/2017   NEUTROABS 7.3 (H) 12/01/2017   HGB 13.7 12/01/2017   HCT 40.8 12/01/2017   MCV 90.6 12/01/2017   PLT 400 12/01/2017      Chemistry      Component Value Date/Time   NA 137 12/01/2017 1313   NA 138 04/06/2014 1328   K 3.5 12/01/2017 1313   K 4.1 04/06/2014 1328   CL 102 12/01/2017 1313   CL 101 04/06/2014 1328   CO2 25 12/01/2017 1313   CO2 30 04/06/2014 1328   BUN 11 12/01/2017 1313   BUN 8 04/06/2014 1328   CREATININE 0.59 12/01/2017 1313   CREATININE 0.62 04/06/2014 1328      Component Value Date/Time   CALCIUM 9.6 12/01/2017 1313   CALCIUM 8.5 04/06/2014 1328   ALKPHOS 109 12/01/2017 1313   ALKPHOS 118 (H) 04/06/2014 1328   AST 17 12/01/2017 1313   AST 18 04/06/2014 1328   ALT 15 12/01/2017 1313   ALT 32 04/06/2014 1328   BILITOT 0.6 12/01/2017 1313   BILITOT 0.4 04/06/2014 1328     IMPRESSION: No evidence of recurrent or metastatic disease within the chest, abdomen, or pelvis.  4.3 cm benign-appearing right ovarian cyst. Recommend followup with pelvic ultrasound in 6 months. This recommendation follows ACR consensus guidelines: White Paper of the ACR Incidental Findings Committee II on Adnexal Findings. J Am Coll Radiol [id number].  Other incidental findings described above.   Electronically Signed   By: Earle Gell M.D.   On: 11/13/2016  12:12   RADIOGRAPHIC STUDIES: I have personally reviewed the radiological images as listed and agreed with the  findings in the report. No results found.   ASSESSMENT & PLAN:  Carcinoma of lower-inner quadrant of right breast in female, estrogen receptor negative ([place]) Breast cancer stage III triple negative [2015]. Clinical trial  S/p adjuvant chemo with dose dense Adriamycin followed by Taxol with carboplatin.   # Clinically no evidence of recurrence. SEP 2018- CT scan- C/A/P- ned. Mammogram 2019- NED/Dr.Byrnett.   # Right ovarian cyst-likely benign followed by gynecology. STABLE.   # Follow-up in 6 months with CBC CMP/Ca-27-29.   Cc; Dr.Byrnett.    No orders of the defined types were placed in this encounter.  All questions were answered. The patient knows to call the clinic with any problems, questions or concerns.      Cammie Sickle, MD 12/01/2017 10:47 PM

## 2017-12-01 NOTE — Assessment & Plan Note (Signed)
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 Clinical trial  S/p adjuvant chemo with dose dense Adriamycin followed by Taxol with carboplatin.   # Clinically no evidence of recurrence. SEP 2018- CT scan- C/A/P- ned. Mammogram 2019- NED/Dr.Byrnett.   # Right ovarian cyst-likely benign followed by gynecology. STABLE.   # Follow-up in 6 months with CBC CMP/Ca-27-29.   Cc; Dr.Byrnett.

## 2017-12-02 LAB — CANCER ANTIGEN 27.29: CA 27.29: 16.4 U/mL (ref 0.0–38.6)

## 2018-03-09 ENCOUNTER — Ambulatory Visit
Admission: RE | Admit: 2018-03-09 | Discharge: 2018-03-09 | Disposition: A | Discharge status: Home / Self Care | Payer: BLUE CROSS/BLUE SHIELD | Source: Ambulatory Visit | Attending: Radiation Oncology | Admitting: Radiation Oncology

## 2018-03-09 ENCOUNTER — Encounter: Payer: Self-pay | Admitting: Radiation Oncology

## 2018-03-09 ENCOUNTER — Other Ambulatory Visit: Payer: Self-pay

## 2018-03-09 VITALS — BP 164/92 | HR 99 | Temp 96.1°F | Resp 18 | Wt 234.8 lb

## 2018-03-09 DIAGNOSIS — Z171 Estrogen receptor negative status [ER-]: Secondary | ICD-10-CM

## 2018-03-09 DIAGNOSIS — Z853 Personal history of malignant neoplasm of breast: Secondary | ICD-10-CM | POA: Insufficient documentation

## 2018-03-09 DIAGNOSIS — C50311 Malignant neoplasm of lower-inner quadrant of right female breast: Secondary | ICD-10-CM

## 2018-03-09 DIAGNOSIS — Z923 Personal history of irradiation: Secondary | ICD-10-CM | POA: Insufficient documentation

## 2018-03-09 NOTE — Progress Notes (Signed)
Radiation Oncology Follow up Note  Name: Felicia Kidd   Date:   03/09/2018 MRN:  [id number] DOB: Jul 26, 1960    This 57 y.o. female presents to the clinic today for 4 year follow-up status post whole breast radiation to her right breast for stage IIIa invasive mammary carcinoma.  REFERRING PROVIDER: Glendon Axe, MD  HPI: patient is a 57 year old female now 4 years out having completed radiation therapy to her right breast and peripheral lymphatics for stage IIIa (T1c N2 a M0) invasive mammary carcinoma. She seen today in routine follow-up and she is doing well. She specifically denies breast tenderness cough or bone pain..mammograms back and back in August which I have reviewed were BI-RADS 2 benign.she is triple negative not on antiestrogen therapy.  COMPLICATIONS OF TREATMENT: none  FOLLOW UP COMPLIANCE: keeps appointments   PHYSICAL EXAM:  BP (!) 164/92 (BP Location: Left Arm, Patient Position: Sitting)   Pulse 99   Temp (!) 96.1 F (35.6 C) (Tympanic)   Resp 18   Wt 234 lb 12.6 oz (106.5 kg)   BMI 45.85 kg/m  Lungs are clear to A&P cardiac examination essentially unremarkable with regular rate and rhythm. No dominant mass or nodularity is noted in either breast in 2 positions examined. Incision is well-healed. No axillary or supraclavicular adenopathy is appreciated. Cosmetic result is excellent.Well-developed well-nourished patient in NAD. HEENT reveals PERLA, EOMI, discs not visualized.  Oral cavity is clear. No oral mucosal lesions are identified. Neck is clear without evidence of cervical or supraclavicular adenopathy. Lungs are clear to A&P. Cardiac examination is essentially unremarkable with regular rate and rhythm without murmur rub or thrill. Abdomen is benign with no organomegaly or masses noted. Motor sensory and DTR levels are equal and symmetric in the upper and lower extremities. Cranial nerves II through XII are grossly intact. Proprioception is intact. No  peripheral adenopathy or edema is identified. No motor or sensory levels are noted. Crude visual fields are within normal range.  RADIOLOGY RESULTS: mammograms reviewed and compatible with the above-stated findings  PLAN: the present time she continues to do well now out over 4 years. I'm please were overall progress. I'm going to turn follow-up care over to Dr. Tollie Pizza as well as medical oncology. Would be happy to reevaluate the patient any time should further consultation be indicated. Patient is to call with any concerns.  I would like to take this opportunity to thank you for allowing me to participate in the care of your patient.Noreene Filbert, MD

## 2018-06-01 ENCOUNTER — Ambulatory Visit: Payer: BLUE CROSS/BLUE SHIELD | Admitting: Internal Medicine

## 2018-06-01 ENCOUNTER — Other Ambulatory Visit: Payer: BLUE CROSS/BLUE SHIELD

## 2018-06-03 ENCOUNTER — Other Ambulatory Visit: Payer: BLUE CROSS/BLUE SHIELD

## 2018-06-03 ENCOUNTER — Ambulatory Visit: Payer: BLUE CROSS/BLUE SHIELD | Admitting: Internal Medicine

## 2018-08-05 ENCOUNTER — Ambulatory Visit: Payer: BLUE CROSS/BLUE SHIELD | Admitting: Internal Medicine

## 2018-08-05 ENCOUNTER — Other Ambulatory Visit: Payer: BLUE CROSS/BLUE SHIELD

## 2018-09-28 ENCOUNTER — Encounter: Payer: Self-pay | Admitting: General Surgery

## 2018-09-28 ENCOUNTER — Other Ambulatory Visit: Payer: Self-pay | Admitting: *Deleted

## 2018-09-28 DIAGNOSIS — C50311 Malignant neoplasm of lower-inner quadrant of right female breast: Secondary | ICD-10-CM

## 2018-09-28 DIAGNOSIS — Z171 Estrogen receptor negative status [ER-]: Secondary | ICD-10-CM

## 2018-10-26 ENCOUNTER — Other Ambulatory Visit: Payer: Self-pay

## 2018-10-26 ENCOUNTER — Ambulatory Visit
Admission: RE | Admit: 2018-10-26 | Discharge: 2018-10-26 | Disposition: A | Discharge status: Home / Self Care | Payer: PRIVATE HEALTH INSURANCE | Source: Ambulatory Visit | Attending: Surgery | Admitting: Surgery

## 2018-10-26 DIAGNOSIS — C50311 Malignant neoplasm of lower-inner quadrant of right female breast: Secondary | ICD-10-CM | POA: Diagnosis not present

## 2018-10-26 DIAGNOSIS — Z171 Estrogen receptor negative status [ER-]: Secondary | ICD-10-CM | POA: Insufficient documentation

## 2018-10-27 ENCOUNTER — Other Ambulatory Visit: Payer: Self-pay

## 2018-10-27 DIAGNOSIS — C50311 Malignant neoplasm of lower-inner quadrant of right female breast: Secondary | ICD-10-CM

## 2018-10-28 ENCOUNTER — Encounter: Payer: Self-pay | Admitting: Internal Medicine

## 2018-10-28 ENCOUNTER — Inpatient Hospital Stay: Payer: PRIVATE HEALTH INSURANCE | Attending: Internal Medicine

## 2018-10-28 ENCOUNTER — Inpatient Hospital Stay (HOSPITAL_BASED_OUTPATIENT_CLINIC_OR_DEPARTMENT_OTHER): Payer: PRIVATE HEALTH INSURANCE | Admitting: Internal Medicine

## 2018-10-28 ENCOUNTER — Other Ambulatory Visit: Payer: Self-pay

## 2018-10-28 VITALS — BP 131/85 | HR 85 | Temp 97.9°F | Resp 16 | Wt 230.0 lb

## 2018-10-28 DIAGNOSIS — R590 Localized enlarged lymph nodes: Secondary | ICD-10-CM | POA: Insufficient documentation

## 2018-10-28 DIAGNOSIS — C50311 Malignant neoplasm of lower-inner quadrant of right female breast: Secondary | ICD-10-CM | POA: Diagnosis not present

## 2018-10-28 DIAGNOSIS — Z853 Personal history of malignant neoplasm of breast: Secondary | ICD-10-CM | POA: Diagnosis present

## 2018-10-28 DIAGNOSIS — Z171 Estrogen receptor negative status [ER-]: Secondary | ICD-10-CM

## 2018-10-28 LAB — COMPREHENSIVE METABOLIC PANEL
ALT: 17 U/L (ref 0–44)
AST: 18 U/L (ref 15–41)
Albumin: 4.3 g/dL (ref 3.5–5.0)
Alkaline Phosphatase: 106 U/L (ref 38–126)
Anion gap: 11 (ref 5–15)
BUN: 18 mg/dL (ref 6–20)
CO2: 26 mmol/L (ref 22–32)
Calcium: 9.6 mg/dL (ref 8.9–10.3)
Chloride: 101 mmol/L (ref 98–111)
Creatinine, Ser: 0.73 mg/dL (ref 0.44–1.00)
GFR calc Af Amer: >60 mL/min (ref >60)
GFR calc non Af Amer: >60 mL/min (ref >60)
Glucose, Bld: 114 mg/dL — ABNORMAL HIGH (ref 70–99)
Potassium: 3.3 mmol/L — ABNORMAL LOW (ref 3.5–5.1)
Sodium: 138 mmol/L (ref 135–145)
Total Bilirubin: 0.7 mg/dL (ref 0.3–1.2)
Total Protein: 7.8 g/dL (ref 6.5–8.1)

## 2018-10-28 LAB — CBC WITH DIFFERENTIAL/PLATELET
Abs Immature Granulocytes: 0.08 10*3/uL — ABNORMAL HIGH (ref 0.00–0.07)
Basophils Absolute: 0.1 10*3/uL (ref 0.0–0.1)
Basophils Relative: 0 %
Eosinophils Absolute: 0.1 10*3/uL (ref 0.0–0.5)
Eosinophils Relative: 1 %
HCT: 39 % (ref 36.0–46.0)
Hemoglobin: 13.4 g/dL (ref 12.0–15.0)
Immature Granulocytes: 1 %
Lymphocytes Relative: 30 %
Lymphs Abs: 3.4 10*3/uL (ref 0.7–4.0)
MCH: 30.7 pg (ref 26.0–34.0)
MCHC: 34.4 g/dL (ref 30.0–36.0)
MCV: 89.4 fL (ref 80.0–100.0)
Monocytes Absolute: 0.6 10*3/uL (ref 0.1–1.0)
Monocytes Relative: 5 %
Neutro Abs: 7.3 10*3/uL (ref 1.7–7.7)
Neutrophils Relative %: 63 %
Platelets: 358 10*3/uL (ref 150–400)
RBC: 4.36 MIL/uL (ref 3.87–5.11)
RDW: 12.5 % (ref 11.5–15.5)
WBC: 11.6 10*3/uL — ABNORMAL HIGH (ref 4.0–10.5)
nRBC: 0 % (ref 0.0–0.2)

## 2018-10-28 NOTE — Progress Notes (Signed)
[facility] OFFICE PROGRESS NOTE  Patient Care Team: Tracie Harrier, MD as PCP - General (Internal Medicine) Ammie Dalton, Okey Regal, MD (Unknown Physician Specialty) Bary Castilla Forest Gleason, MD (General Surgery)  Cancer Staging No matching staging information was found for the patient.   Oncology History Overview Note  1. Carcinoma of breast pT1c oN2a Mo  stage  IIIa.extensive lymphovascular invasion 6/14 lymph nodes uninvolved with extra capsular  invasion.Marland Kitchen     2.NEGATIVE FOR BRCA 1  AND 2 MUTATION  estrogen and progesterone receptor negative HER-2/neu receptor negative (triple negative disease) diagnosis in July of 2015.  Status post lumpectomy and axillary node dissection, 2. Started on chemotherapy with NSABP protocol is dose dense Cytoxan Adriamycin August of 2015 3.as per protocol patient was switched  to  carboplatinum and Taxol fromOctober 28, 2015 4.Patient has finished carboplatinum and Taxol in March 25, 2014 Would start radiation therapy to the breast in February of 2016 -----------------------------------------------------------------------   DIAGNOSIS: BREAST CANCER  STAGE:  III    ;GOALS: cure  CURRENT/MOST RECENT THERAPY: surveillaince    Carcinoma of lower-inner quadrant of right breast in female, estrogen receptor negative ([place])     INTERVAL HISTORY:  Felicia Kidd 58 y.o.  female pleasant patient above history of Stage III triple negative breast cancer currently on surveillance Is here for follow-up.   Patient denies any new lumps or bumps.  Appetite is good.  No weight loss.  No nausea vomiting.  No headaches.  No cough or shortness of breath.  Review of Systems  Constitutional: Negative for chills, diaphoresis, fever, malaise/fatigue and weight loss.  HENT: Negative for nosebleeds and sore throat.   Eyes: Negative for double vision.  Respiratory: Negative for cough, hemoptysis, sputum production, shortness of breath and wheezing.    Cardiovascular: Negative for chest pain, palpitations, orthopnea and leg swelling.  Gastrointestinal: Negative for abdominal pain, blood in stool, constipation, diarrhea, heartburn, melena, nausea and vomiting.  Genitourinary: Negative for dysuria, frequency and urgency.  Musculoskeletal: Negative for back pain and joint pain.  Skin: Negative.  Negative for itching and rash.  Neurological: Negative for dizziness, tingling, focal weakness, weakness and headaches.  Endo/Heme/Allergies: Does not bruise/bleed easily.  Psychiatric/Behavioral: Negative for depression. The patient is not nervous/anxious and does not have insomnia.    PAST MEDICAL HISTORY :  Past Medical History:  Diagnosis Date  . Allergy   . Asthma   . Breast cancer ([place]) 2015    right breast lumpectomy with chemo and rad tx  . Breast cancer of lower-inner quadrant of right female breast ([place]) 08/2013   Triple negative, T1c, N2a.(6/14 nodes +) Treated with Cytoxan and Adriamycin in a dose dense fashion followed by carbotaxol.    . Bronchitis   . Diabetes mellitus without complication ([place])   . Hyperlipidemia   . Hypertension   . Personal history of chemotherapy 10/2013   right breast ca  . Personal history of radiation therapy 04/2014   right breast ca  . Thyroid disease     PAST SURGICAL HISTORY :   Past Surgical History:  Procedure Laterality Date  . BREAST BIOPSY Right 09/07/2013   invasive mammary carcinoma  . 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 Clear margins  . BREAST SURGERY Right 09-27-13   Wide excision of Right breast lesion with attempted sentinel node biopsy followed by axillary dissection  . COLONOSCOPY N/A 02/23/2015   Procedure: COLONOSCOPY;  Surgeon: Manya Silvas,  MD;  Location: [facility] ENDOSCOPY;  Service: Endoscopy;  Laterality: N/A;   Needs early AM - IDDM  . DILATION AND CURETTAGE OF UTERUS     Dr Vernie Ammons  . PORTACATH PLACEMENT   11/02/2013   removed 11/02/2014.    FAMILY HISTORY :   Family History  Problem Relation Age of Onset  . Diabetes Mother   . Breast cancer Neg Hx   . Ovarian cancer Neg Hx   . Colon cancer Neg Hx     SOCIAL HISTORY:   Social History   Tobacco Use  . Smoking status: Never Smoker  . Smokeless tobacco: Never Used  Substance Use Topics  . Alcohol use: No  . Drug use: No    ALLERGIES:  has No Known Allergies.  MEDICATIONS:  Current Outpatient Medications  Medication Sig Dispense Refill  . acetaminophen (TYLENOL) 325 MG tablet Take 650 mg by mouth every 6 (six) hours as needed for moderate pain.     Marland Kitchen atorvastatin (LIPITOR) 10 MG tablet Take 10 mg by mouth daily.    . clobetasol ointment (TEMOVATE) 6.29 % Apply 1 application topically once a week.  0  . gabapentin (NEURONTIN) 300 MG capsule Take 300 mg by mouth 2 (two) times daily.    . Loratadine (CLARITIN) 10 MG CAPS Take by mouth.    . LUTEIN-ZEAXANTHIN PO Take 1 tablet by mouth daily.     . Magnesium 250 MG TABS Take 1 tablet by mouth 2 (two) times daily.     . metFORMIN (GLUCOPHAGE) 500 MG tablet TAKE 1 TABLET(500 MG) BY MOUTH TWICE DAILY    . montelukast (SINGULAIR) 10 MG tablet TAKE 1 TABLET(10 MG) BY MOUTH EVERY NIGHT    . Multiple Vitamins-Minerals (MULTIVITAMIN WITH MINERALS) tablet Take 1 tablet by mouth daily.    . Omega-3 Fatty Acids (FISH OIL PO) Take by mouth.    Marland Kitchen omeprazole (PRILOSEC) 20 MG capsule Take 20 mg by mouth 3 (three) times a week.     . telmisartan-hydrochlorothiazide (MICARDIS HCT) 40-12.5 MG tablet TAKE 1 TABLET BY MOUTH ONCE DAILY    . celecoxib (CELEBREX) 200 MG capsule take 1 tablet daily  2  . fluticasone (FLONASE SENSIMIST) 27.5 MCG/SPRAY nasal spray Place into the nose.    . meloxicam (MOBIC) 7.5 MG tablet      No current facility-administered medications for this visit.    Facility-Administered Medications Ordered in Other Visits  Medication Dose Route Frequency Provider Last Rate Last  Dose  . heparin lock flush 100 unit/mL  500 Units Intravenous Once Choksi, Janak, MD      . sodium chloride 0.9 % injection 10 mL  10 mL Intravenous PRN Choksi, Janak, MD      . sodium chloride 0.9 % injection 10 mL  10 mL Intravenous PRN Forest Gleason, MD   10 mL at 10/26/14 0940    PHYSICAL EXAMINATION: ECOG PERFORMANCE STATUS: 0 - Asymptomatic  BP 131/85 (BP Location: Left Arm, Patient Position: Sitting, Cuff Size: Normal)   Pulse 85   Temp 97.9 F (36.6 C) (Tympanic)   Resp 16   Wt 230 lb (104.3 kg)   BMI 44.92 kg/m   Filed Weights   10/28/18 1438  Weight: 230 lb (104.3 kg)    Physical Exam  Constitutional: She is oriented to person, place, and time and well-developed, well-nourished, and in no distress.  HENT:  Head: Normocephalic and atraumatic.  Mouth/Throat: Oropharynx is clear and moist. No oropharyngeal exudate.  Eyes: Pupils are equal,  round, and reactive to light.  Neck: Normal range of motion. Neck supple.  Approximately 1 to 2 cm left axillary lymph node felt.  Cardiovascular: Normal rate and regular rhythm.  Pulmonary/Chest: No respiratory distress. She has no wheezes.  Abdominal: Soft. Bowel sounds are normal. She exhibits no distension and no mass. There is no abdominal tenderness. There is no rebound and no guarding.  Musculoskeletal: Normal range of motion.        General: No tenderness or edema.  Neurological: She is alert and oriented to person, place, and time.  Skin: Skin is warm.  Psychiatric: Affect normal.   LABORATORY DATA:  I have reviewed the data as listed    Component Value Date/Time   NA 138 10/28/2018 1413   NA 138 04/06/2014 1328   K 3.3 (L) 10/28/2018 1413   K 4.1 04/06/2014 1328   CL 101 10/28/2018 1413   CL 101 04/06/2014 1328   CO2 26 10/28/2018 1413   CO2 30 04/06/2014 1328   GLUCOSE 114 (H) 10/28/2018 1413   GLUCOSE 105 (H) 04/06/2014 1328   BUN 18 10/28/2018 1413   BUN 8 04/06/2014 1328   CREATININE 0.73 10/28/2018 1413    CREATININE 0.62 04/06/2014 1328   CALCIUM 9.6 10/28/2018 1413   CALCIUM 8.5 04/06/2014 1328   PROT 7.8 10/28/2018 1413   PROT 6.7 04/06/2014 1328   ALBUMIN 4.3 10/28/2018 1413   ALBUMIN 3.3 (L) 04/06/2014 1328   AST 18 10/28/2018 1413   AST 18 04/06/2014 1328   ALT 17 10/28/2018 1413   ALT 32 04/06/2014 1328   ALKPHOS 106 10/28/2018 1413   ALKPHOS 118 (H) 04/06/2014 1328   BILITOT 0.7 10/28/2018 1413   BILITOT 0.4 04/06/2014 1328   GFRNONAA >60 10/28/2018 1413   GFRNONAA >60 04/06/2014 1328   GFRNONAA >60 12/01/2013 0926   GFRAA >60 10/28/2018 1413   GFRAA >60 04/06/2014 1328   GFRAA >60 12/01/2013 0926    No results found for: SPEP, UPEP  Lab Results  Component Value Date   WBC 11.6 (H) 10/28/2018   NEUTROABS 7.3 10/28/2018   HGB 13.4 10/28/2018   HCT 39.0 10/28/2018   MCV 89.4 10/28/2018   PLT 358 10/28/2018      Chemistry      Component Value Date/Time   NA 138 10/28/2018 1413   NA 138 04/06/2014 1328   K 3.3 (L) 10/28/2018 1413   K 4.1 04/06/2014 1328   CL 101 10/28/2018 1413   CL 101 04/06/2014 1328   CO2 26 10/28/2018 1413   CO2 30 04/06/2014 1328   BUN 18 10/28/2018 1413   BUN 8 04/06/2014 1328   CREATININE 0.73 10/28/2018 1413   CREATININE 0.62 04/06/2014 1328      Component Value Date/Time   CALCIUM 9.6 10/28/2018 1413   CALCIUM 8.5 04/06/2014 1328   ALKPHOS 106 10/28/2018 1413   ALKPHOS 118 (H) 04/06/2014 1328   AST 18 10/28/2018 1413   AST 18 04/06/2014 1328   ALT 17 10/28/2018 1413   ALT 32 04/06/2014 1328   BILITOT 0.7 10/28/2018 1413   BILITOT 0.4 04/06/2014 1328      RADIOGRAPHIC STUDIES: I have personally reviewed the radiological images as listed and agreed with the findings in the report. No results found.   ASSESSMENT & PLAN:  Carcinoma of lower-inner quadrant of right breast in female, estrogen receptor negative ([place]) Breast cancer stage III triple negative [2015]. Clinical trial  S/p adjuvant chemo with dose dense  Adriamycin  followed by Taxol with carboplatin.  Stable.  # Clinically no evidence of recurrence. SEP 2018- CT scan- C/A/P- ned. Mammogram 2020- NED/Dr.Byrnett.   # Lymphnode Left underarm-1 to 2 cm unclear etiology.  Order [place] left axilla.   #Bone density/osteoporosis surveillance discussed-we will order at next visit.  Continue calcium plus vitamin D.  # DISPOSITION: # [place] axilla ASAP # Follow-up in 6 months- MD;labs- CBC CMP/Ca-27-29- dr.B  Cc; Dr.Byrnett.    No orders of the defined types were placed in this encounter.  All questions were answered. The patient knows to call the clinic with any problems, questions or concerns.      Cammie Sickle, MD 11/01/2018 11:51 AM

## 2018-10-28 NOTE — Assessment & Plan Note (Addendum)
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 Clinical trial  S/p adjuvant chemo with dose dense Adriamycin followed by Taxol with carboplatin.  Stable.  # Clinically no evidence of recurrence. SEP 2018- CT scan- C/A/P- ned. Mammogram 2020- NED/Dr.Byrnett.   # Lymphnode Left underarm-1 to 2 cm unclear etiology.  Order [place] left axilla.   #Bone density/osteoporosis surveillance discussed-we will order at next visit.  Continue calcium plus vitamin D.  # DISPOSITION: # [place] axilla ASAP # Follow-up in 6 months- MD;labs- CBC CMP/Ca-27-29- dr.B  Cc; Dr.Byrnett.

## 2018-10-29 ENCOUNTER — Other Ambulatory Visit: Payer: Self-pay | Admitting: Internal Medicine

## 2018-10-29 DIAGNOSIS — R590 Localized enlarged lymph nodes: Secondary | ICD-10-CM

## 2018-10-29 LAB — CANCER ANTIGEN 27.29: CA 27.29: 16 U/mL (ref 0.0–38.6)

## 2018-11-03 ENCOUNTER — Ambulatory Visit
Admission: RE | Admit: 2018-11-03 | Discharge: 2018-11-03 | Disposition: A | Discharge status: Home / Self Care | Payer: PRIVATE HEALTH INSURANCE | Source: Ambulatory Visit | Attending: Internal Medicine | Admitting: Internal Medicine

## 2018-11-03 DIAGNOSIS — R590 Localized enlarged lymph nodes: Secondary | ICD-10-CM | POA: Insufficient documentation

## 2018-11-18 ENCOUNTER — Other Ambulatory Visit: Payer: Self-pay

## 2018-11-18 ENCOUNTER — Encounter: Payer: Self-pay | Admitting: Surgery

## 2018-11-18 ENCOUNTER — Ambulatory Visit (INDEPENDENT_AMBULATORY_CARE_PROVIDER_SITE_OTHER): Payer: PRIVATE HEALTH INSURANCE | Admitting: Surgery

## 2018-11-18 VITALS — BP 173/91 | HR 97 | Temp 97.8°F | Ht 60.0 in | Wt 230.0 lb

## 2018-11-18 DIAGNOSIS — C50311 Malignant neoplasm of lower-inner quadrant of right female breast: Secondary | ICD-10-CM

## 2018-11-18 DIAGNOSIS — Z171 Estrogen receptor negative status [ER-]: Secondary | ICD-10-CM

## 2018-11-18 NOTE — Progress Notes (Signed)
11/18/2018  History of Present Illness: Felicia Kidd is a 58 y.o. female status post right breast lumpectomy and axillary dissection and 09/2013 for right breast cancer.  She is also status post chemotherapy and radiation treatments.  She presents today for yearly follow-up.  Her last mammogram was on 10/26/2018.  Patient reports that she has been doing very well with no palpable masses, skin changes, or nipple changes.  She did see Dr. Rogue Bussing on 8/19 at which time there was concern for potential enlarged lymph node in the left axilla.  Ultrasound of the left axilla was done on 8/25 which only showed normal-appearing left axillary lymph nodes with no evidence of malignancy.  Past Medical History: Past Medical History:  Diagnosis Date  . Allergy   . Asthma   . Breast cancer ([place]) 2015    right breast lumpectomy with chemo and rad tx  . Breast cancer of lower-inner quadrant of right female breast ([place]) 08/2013   Triple negative, T1c, N2a.(6/14 nodes +) Treated with Cytoxan and Adriamycin in a dose dense fashion followed by carbotaxol.    . Bronchitis   . Diabetes mellitus without complication ([place])   . Hyperlipidemia   . Hypertension   . Personal history of chemotherapy 10/2013   right breast ca  . Personal history of radiation therapy 04/2014   right breast ca  . Thyroid disease      Past Surgical History: Past Surgical History:  Procedure Laterality Date  . BREAST BIOPSY Right 09/07/2013   invasive mammary carcinoma  . 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 Clear margins  . BREAST SURGERY Right 09-27-13   Wide excision of Right breast lesion with attempted sentinel node biopsy followed by axillary dissection  . COLONOSCOPY N/A 02/23/2015   Procedure: COLONOSCOPY;  Surgeon: Manya Silvas, MD;  Location: [facility] ENDOSCOPY;  Service: Endoscopy;  Laterality: N/A;   Needs early AM - IDDM  . DILATION AND CURETTAGE OF  UTERUS     Dr Vernie Ammons  . PORTACATH PLACEMENT  11/02/2013   removed 11/02/2014.    Home Medications: Prior to Admission medications   Medication Sig Start Date End Date Taking? Authorizing Provider  acetaminophen (TYLENOL) 325 MG tablet Take 650 mg by mouth every 6 (six) hours as needed for moderate pain.    Yes [provider]  atorvastatin (LIPITOR) 10 MG tablet Take 10 mg by mouth daily.   Yes [provider]  celecoxib (CELEBREX) 200 MG capsule take 1 tablet daily 09/26/15  Yes [provider]  clobetasol ointment (TEMOVATE) [id number] % Apply 1 application topically once a week. 02/20/16  Yes [provider]  gabapentin (NEURONTIN) 300 MG capsule Take 300 mg by mouth 2 (two) times daily.   Yes [provider]  Loratadine (CLARITIN) 10 MG CAPS Take by mouth.   Yes [provider]  LUTEIN-ZEAXANTHIN PO Take 1 tablet by mouth daily.    Yes [provider]  Magnesium 250 MG TABS Take 1 tablet by mouth 2 (two) times daily.    Yes [provider]  meloxicam (MOBIC) 7.5 MG tablet  09/21/18  Yes [provider]  metFORMIN (GLUCOPHAGE) 500 MG tablet TAKE 1 TABLET(500 MG) BY MOUTH TWICE DAILY 09/18/15  Yes [provider]  montelukast (SINGULAIR) 10 MG tablet TAKE 1 TABLET(10 MG) BY MOUTH EVERY NIGHT 01/17/17  Yes [provider]  Multiple Vitamins-Minerals (MULTIVITAMIN WITH MINERALS) tablet Take 1 tablet by mouth daily.  Yes [provider]  Omega-3 Fatty Acids (FISH OIL PO) Take by mouth.   Yes [provider]  omeprazole (PRILOSEC) 20 MG capsule Take 20 mg by mouth 3 (three) times a week.    Yes [provider]  telmisartan-hydrochlorothiazide (MICARDIS HCT) 40-12.5 MG tablet TAKE 1 TABLET BY MOUTH ONCE DAILY 09/21/15  Yes [provider]  fluticasone (FLONASE SENSIMIST) 27.5 MCG/SPRAY nasal spray Place into the nose. 06/27/16 03/09/18  [provider]     Allergies: No Known Allergies  Review of Systems: Review of Systems  Constitutional: Negative for chills and fever.  Respiratory: Negative for shortness of breath.   Cardiovascular: Negative for chest pain.  Gastrointestinal: Negative for nausea and vomiting.  Skin: Negative for rash.    Physical Exam BP (!) 173/91   Pulse 97   Temp 97.8 F (36.6 C) (Skin)   Ht 5' (1.524 m)   Wt 230 lb (104.3 kg)   SpO2 98%   BMI 44.92 kg/m  CONSTITUTIONAL: No acute distress HEENT:  Normocephalic, atraumatic, extraocular motion intact. RESPIRATORY:  Lungs are clear, and breath sounds are equal bilaterally. Normal respiratory effort without pathologic use of accessory muscles. CARDIOVASCULAR: Heart is regular without murmurs, gallops, or rubs. BREAST: Right breast status post lumpectomy in the lower inner quadrant with well-healed scar.  There are no palpable masses, skin changes, or nipple changes.  Right axilla status post axillary dissection with a well-healed scar as well.  No palpable right axillary or supraclavicular lymphadenopathy.  On the left side, left breast exam reveals no masses, skin changes, or nipple changes.  There is no left axillary or supraclavicular lymphadenopathy. NEUROLOGIC:  Motor and sensation is grossly normal.  Cranial nerves are grossly intact. PSYCH:  Alert and oriented to person, place and time. Affect is normal.  Labs/Imaging: Mammogram 10/26/2018: FINDINGS: Stable postlumpectomy changes right breast. No new masses, calcifications or nonsurgical distortion identified within either breast.  Mammographic images were processed with CAD.  IMPRESSION: No mammographic evidence for malignancy.  RECOMMENDATION: Screening mammogram in one year.(Code:[id number]-B-01Y)  Ultrasound left axilla 11/03/2018: FINDINGS: On physical exam, no mass is palpable in the left axilla or axillary tail region of the left breast.  Targeted ultrasound is performed, showing normal  appearing left inferior axillary lymph nodes with no mass or abnormal nodes seen.  IMPRESSION: No evidence of malignancy.  RECOMMENDATION: Bilateral screening mammogram in 1 year.  Assessment and Plan: This is a 58 y.o. female status post right breast lumpectomy and axillary dissection in 09/2013.  -Discussed with the patient that she has been doing very well and her recent mammograms have all been negative.  Her exam today is reassuring as well and there is no new suspicious findings on exam.  At this point she is 5 years out from her surgery and no longer needs to follow-up with the surgical service.  Discussed with the patient that Dr. Rogue Bussing can order future bilateral screening mammograms and can follow-up with the patient as well.  If there are any suspicious findings or issues that may warrant excisional biopsy, I reassured the patient that we would always be available to help her out.  She is always welcome to call us with any questions or concerns as well.  Patient is in agreement with this.  Face-to-face time spent with the patient and care providers was 15 minutes, with more than 50% of the time spent counseling, educating, and coordinating care of the patient.     Melvyn Neth, MD  [facility]

## 2018-11-18 NOTE — Patient Instructions (Addendum)
Patient to have a bilateral screening mammogram and follow up with Dr Rogue Bussing next year.   Follow-up with our office as needed.  Please call and ask to speak with a nurse if you develop questions or concerns.

## 2019-04-27 ENCOUNTER — Other Ambulatory Visit: Payer: Self-pay | Admitting: *Deleted

## 2019-04-27 DIAGNOSIS — C50311 Malignant neoplasm of lower-inner quadrant of right female breast: Secondary | ICD-10-CM

## 2019-04-28 ENCOUNTER — Other Ambulatory Visit: Payer: Self-pay

## 2019-04-28 ENCOUNTER — Inpatient Hospital Stay: Payer: 59 | Attending: Internal Medicine

## 2019-04-28 ENCOUNTER — Inpatient Hospital Stay (HOSPITAL_BASED_OUTPATIENT_CLINIC_OR_DEPARTMENT_OTHER): Payer: 59 | Admitting: Internal Medicine

## 2019-04-28 VITALS — BP 150/78 | HR 89 | Temp 97.9°F | Resp 20 | Ht 60.0 in | Wt 237.0 lb

## 2019-04-28 DIAGNOSIS — C50311 Malignant neoplasm of lower-inner quadrant of right female breast: Secondary | ICD-10-CM

## 2019-04-28 DIAGNOSIS — E785 Hyperlipidemia, unspecified: Secondary | ICD-10-CM | POA: Insufficient documentation

## 2019-04-28 DIAGNOSIS — E119 Type 2 diabetes mellitus without complications: Secondary | ICD-10-CM | POA: Diagnosis not present

## 2019-04-28 DIAGNOSIS — C50312 Malignant neoplasm of lower-inner quadrant of left female breast: Secondary | ICD-10-CM | POA: Insufficient documentation

## 2019-04-28 DIAGNOSIS — I10 Essential (primary) hypertension: Secondary | ICD-10-CM | POA: Diagnosis not present

## 2019-04-28 DIAGNOSIS — Z171 Estrogen receptor negative status [ER-]: Secondary | ICD-10-CM | POA: Diagnosis not present

## 2019-04-28 DIAGNOSIS — E079 Disorder of thyroid, unspecified: Secondary | ICD-10-CM | POA: Diagnosis not present

## 2019-04-28 DIAGNOSIS — Z853 Personal history of malignant neoplasm of breast: Secondary | ICD-10-CM | POA: Diagnosis present

## 2019-04-28 LAB — COMPREHENSIVE METABOLIC PANEL
ALT: 19 U/L (ref 0–44)
AST: 19 U/L (ref 15–41)
Albumin: 4.3 g/dL (ref 3.5–5.0)
Alkaline Phosphatase: 89 U/L (ref 38–126)
Anion gap: 12 (ref 5–15)
BUN: 16 mg/dL (ref 6–20)
CO2: 25 mmol/L (ref 22–32)
Calcium: 9.8 mg/dL (ref 8.9–10.3)
Chloride: 100 mmol/L (ref 98–111)
Creatinine, Ser: 0.58 mg/dL (ref 0.44–1.00)
GFR calc Af Amer: >60 mL/min (ref >60)
GFR calc non Af Amer: >60 mL/min (ref >60)
Glucose, Bld: 110 mg/dL — ABNORMAL HIGH (ref 70–99)
Potassium: 3.4 mmol/L — ABNORMAL LOW (ref 3.5–5.1)
Sodium: 137 mmol/L (ref 135–145)
Total Bilirubin: 0.6 mg/dL (ref 0.3–1.2)
Total Protein: 7.6 g/dL (ref 6.5–8.1)

## 2019-04-28 LAB — CBC WITH DIFFERENTIAL/PLATELET
Abs Immature Granulocytes: 0.09 10*3/uL — ABNORMAL HIGH (ref 0.00–0.07)
Basophils Absolute: 0.1 10*3/uL (ref 0.0–0.1)
Basophils Relative: 0 %
Eosinophils Absolute: 0.3 10*3/uL (ref 0.0–0.5)
Eosinophils Relative: 3 %
HCT: 40.7 % (ref 36.0–46.0)
Hemoglobin: 13.3 g/dL (ref 12.0–15.0)
Immature Granulocytes: 1 %
Lymphocytes Relative: 29 %
Lymphs Abs: 3.2 10*3/uL (ref 0.7–4.0)
MCH: 30 pg (ref 26.0–34.0)
MCHC: 32.7 g/dL (ref 30.0–36.0)
MCV: 91.9 fL (ref 80.0–100.0)
Monocytes Absolute: 0.6 10*3/uL (ref 0.1–1.0)
Monocytes Relative: 5 %
Neutro Abs: 7 10*3/uL (ref 1.7–7.7)
Neutrophils Relative %: 62 %
Platelets: 377 10*3/uL (ref 150–400)
RBC: 4.43 MIL/uL (ref 3.87–5.11)
RDW: 11.7 % (ref 11.5–15.5)
WBC: 11.2 10*3/uL — ABNORMAL HIGH (ref 4.0–10.5)
nRBC: 0 % (ref 0.0–0.2)

## 2019-04-28 NOTE — Assessment & Plan Note (Addendum)
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 Clinically no concern of recurrence; continue mammograms-August 2021.   #Bone density/osteoporosis surveillance discussed.recommend bone density test continue calcium plus vitamin D.   # DISPOSITION: # mammoBMD- in aug 2021-  # Follow-up in 6 months- MD;labs- CBC CMP/Ca-27-29- dr.B

## 2019-04-28 NOTE — Progress Notes (Signed)
[facility] OFFICE PROGRESS NOTE  Patient Care Team: Tracie Harrier, MD as PCP - General (Internal Medicine) Ammie Dalton, Okey Regal, MD (Unknown Physician Specialty) Bary Castilla Forest Gleason, MD (General Surgery)  Cancer Staging No matching staging information was found for the patient.   Oncology History Overview Note  1. Carcinoma of breast pT1c oN2a Mo  stage  IIIa.extensive lymphovascular invasion 6/14 lymph nodes uninvolved with extra capsular  invasion.Marland Kitchen     2.NEGATIVE FOR BRCA 1  AND 2 MUTATION  estrogen and progesterone receptor negative HER-2/neu receptor negative (triple negative disease) diagnosis in July of 2015.  Status post lumpectomy and axillary node dissection, 2. Started on chemotherapy with NSABP protocol is dose dense Cytoxan Adriamycin August of 2015 3.as per protocol patient was switched  to  carboplatinum and Taxol fromOctober 28, 2015 4.Patient has finished carboplatinum and Taxol in March 25, 2014 Would start radiation therapy to the breast in February of 2016  # GENETICS- [as per pt] NEG.  ----------------------------------------------------------------------------   DIAGNOSIS: BREAST CANCER  STAGE:  III    ;GOALS: cure  CURRENT/MOST RECENT THERAPY: surveillaince    Carcinoma of lower-inner quadrant of right breast in female, estrogen receptor negative ([place])     INTERVAL HISTORY:  Felicia Kidd 59 y.o.  female pleasant patient above history of Stage III triple negative breast cancer currently on surveillance Is here for follow-up.   Patient denies any new lumps or bumps but appetite is good.  No headaches.  Nausea vomiting.   Review of Systems  Constitutional: Negative for chills, diaphoresis, fever, malaise/fatigue and weight loss.  HENT: Negative for nosebleeds and sore throat.   Eyes: Negative for double vision.  Respiratory: Negative for cough, hemoptysis, sputum production, shortness of breath and wheezing.    Cardiovascular: Negative for chest pain, palpitations, orthopnea and leg swelling.  Gastrointestinal: Negative for abdominal pain, blood in stool, constipation, diarrhea, heartburn, melena, nausea and vomiting.  Genitourinary: Negative for dysuria, frequency and urgency.  Musculoskeletal: Negative for back pain and joint pain.  Skin: Negative.  Negative for itching and rash.  Neurological: Negative for dizziness, tingling, focal weakness, weakness and headaches.  Endo/Heme/Allergies: Does not bruise/bleed easily.  Psychiatric/Behavioral: Negative for depression. The patient is not nervous/anxious and does not have insomnia.    PAST MEDICAL HISTORY :  Past Medical History:  Diagnosis Date  . Allergy   . Asthma   . Breast cancer ([place]) 2015    right breast lumpectomy with chemo and rad tx  . Breast cancer of lower-inner quadrant of right female breast ([place]) 08/2013   Triple negative, T1c, N2a.(6/14 nodes +) Treated with Cytoxan and Adriamycin in a dose dense fashion followed by carbotaxol.    . Bronchitis   . Diabetes mellitus without complication ([place])   . Hyperlipidemia   . Hypertension   . Personal history of chemotherapy 10/2013   right breast ca  . Personal history of radiation therapy 04/2014   right breast ca  . Thyroid disease     PAST SURGICAL HISTORY :   Past Surgical History:  Procedure Laterality Date  . BREAST BIOPSY Right 09/07/2013   invasive mammary carcinoma  . 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 Clear margins  . BREAST SURGERY Right 09-27-13   Wide excision of Right breast lesion with attempted sentinel node biopsy followed by axillary dissection  . COLONOSCOPY N/A 02/23/2015   Procedure: COLONOSCOPY;  Surgeon: Manya Silvas, MD;  Location:  [facility];  Service: Endoscopy;  Laterality: N/A;   Needs early AM - IDDM  . DILATION AND CURETTAGE OF UTERUS     Dr Vernie Ammons  . PORTACATH PLACEMENT   11/02/2013   removed 11/02/2014.    FAMILY HISTORY :   Family History  Problem Relation Age of Onset  . Diabetes Mother   . Breast cancer Neg Hx   . Ovarian cancer Neg Hx   . Colon cancer Neg Hx     SOCIAL HISTORY:   Social History   Tobacco Use  . Smoking status: Never Smoker  . Smokeless tobacco: Never Used  Substance Use Topics  . Alcohol use: No  . Drug use: No    ALLERGIES:  has No Known Allergies.  MEDICATIONS:  Current Outpatient Medications  Medication Sig Dispense Refill  . acetaminophen (TYLENOL) 325 MG tablet Take 650 mg by mouth every 6 (six) hours as needed for moderate pain.     . celecoxib (CELEBREX) 200 MG capsule take 1 tablet daily  2  . clobetasol ointment (TEMOVATE) 1.61 % Apply 1 application topically once a week.  0  . fluticasone (FLONASE SENSIMIST) 27.5 MCG/SPRAY nasal spray Place into the nose.    . gabapentin (NEURONTIN) 300 MG capsule Take 300 mg by mouth 2 (two) times daily.    . Loratadine (CLARITIN) 10 MG CAPS Take by mouth.    . LUTEIN-ZEAXANTHIN PO Take 1 tablet by mouth daily.     . Magnesium 250 MG TABS Take 1 tablet by mouth 2 (two) times daily.     . metFORMIN (GLUCOPHAGE) 500 MG tablet TAKE 1 TABLET(500 MG) BY MOUTH TWICE DAILY    . montelukast (SINGULAIR) 10 MG tablet TAKE 1 TABLET(10 MG) BY MOUTH EVERY NIGHT    . Multiple Vitamins-Minerals (MULTIVITAMIN WITH MINERALS) tablet Take 1 tablet by mouth daily.    . Omega-3 Fatty Acids (FISH OIL PO) Take by mouth.    Marland Kitchen omeprazole (PRILOSEC) 20 MG capsule Take 20 mg by mouth 3 (three) times a week.     . telmisartan-hydrochlorothiazide (MICARDIS HCT) 40-12.5 MG tablet TAKE 1 TABLET BY MOUTH ONCE DAILY     No current facility-administered medications for this visit.   Facility-Administered Medications Ordered in Other Visits  Medication Dose Route Frequency Provider Last Rate Last Admin  . heparin lock flush 100 unit/mL  500 Units Intravenous Once Choksi, Janak, MD      . sodium chloride  0.9 % injection 10 mL  10 mL Intravenous PRN Choksi, Janak, MD      . sodium chloride 0.9 % injection 10 mL  10 mL Intravenous PRN Forest Gleason, MD   10 mL at 10/26/14 0940    PHYSICAL EXAMINATION: ECOG PERFORMANCE STATUS: 0 - Asymptomatic  BP (!) 150/78 (Patient Position: Sitting)   Pulse 89   Temp 97.9 F (36.6 C) (Tympanic)   Resp 20   Ht 5' (1.524 m)   Wt 237 lb (107.5 kg)   BMI 46.29 kg/m   Filed Weights   04/28/19 1414  Weight: 237 lb (107.5 kg)    Physical Exam  Constitutional: She is oriented to person, place, and time and well-developed, well-nourished, and in no distress.  HENT:  Head: Normocephalic and atraumatic.  Mouth/Throat: Oropharynx is clear and moist. No oropharyngeal exudate.  Eyes: Pupils are equal, round, and reactive to light.  Cardiovascular: Normal rate and regular rhythm.  Pulmonary/Chest: Effort normal and breath sounds normal. No respiratory distress. She has no wheezes.  Abdominal:  Soft. Bowel sounds are normal. She exhibits no distension and no mass. There is no abdominal tenderness. There is no rebound and no guarding.  Musculoskeletal:        General: No tenderness or edema. Normal range of motion.     Cervical back: Normal range of motion and neck supple.  Neurological: She is alert and oriented to person, place, and time.  Skin: Skin is warm.  Psychiatric: Affect normal.   LABORATORY DATA:  I have reviewed the data as listed    Component Value Date/Time   NA 137 04/28/2019 1327   NA 138 04/06/2014 1328   K 3.4 (L) 04/28/2019 1327   K 4.1 04/06/2014 1328   CL 100 04/28/2019 1327   CL 101 04/06/2014 1328   CO2 25 04/28/2019 1327   CO2 30 04/06/2014 1328   GLUCOSE 110 (H) 04/28/2019 1327   GLUCOSE 105 (H) 04/06/2014 1328   BUN 16 04/28/2019 1327   BUN 8 04/06/2014 1328   CREATININE 0.58 04/28/2019 1327   CREATININE 0.62 04/06/2014 1328   CALCIUM 9.8 04/28/2019 1327   CALCIUM 8.5 04/06/2014 1328   PROT 7.6 04/28/2019 1327    PROT 6.7 04/06/2014 1328   ALBUMIN 4.3 04/28/2019 1327   ALBUMIN 3.3 (L) 04/06/2014 1328   AST 19 04/28/2019 1327   AST 18 04/06/2014 1328   ALT 19 04/28/2019 1327   ALT 32 04/06/2014 1328   ALKPHOS 89 04/28/2019 1327   ALKPHOS 118 (H) 04/06/2014 1328   BILITOT 0.6 04/28/2019 1327   BILITOT 0.4 04/06/2014 1328   GFRNONAA >60 04/28/2019 1327   GFRNONAA >60 04/06/2014 1328   GFRNONAA >60 12/01/2013 0926   GFRAA >60 04/28/2019 1327   GFRAA >60 04/06/2014 1328   GFRAA >60 12/01/2013 0926    No results found for: SPEP, UPEP  Lab Results  Component Value Date   WBC 11.2 (H) 04/28/2019   NEUTROABS 7.0 04/28/2019   HGB 13.3 04/28/2019   HCT 40.7 04/28/2019   MCV 91.9 04/28/2019   PLT 377 04/28/2019      Chemistry      Component Value Date/Time   NA 137 04/28/2019 1327   NA 138 04/06/2014 1328   K 3.4 (L) 04/28/2019 1327   K 4.1 04/06/2014 1328   CL 100 04/28/2019 1327   CL 101 04/06/2014 1328   CO2 25 04/28/2019 1327   CO2 30 04/06/2014 1328   BUN 16 04/28/2019 1327   BUN 8 04/06/2014 1328   CREATININE 0.58 04/28/2019 1327   CREATININE 0.62 04/06/2014 1328      Component Value Date/Time   CALCIUM 9.8 04/28/2019 1327   CALCIUM 8.5 04/06/2014 1328   ALKPHOS 89 04/28/2019 1327   ALKPHOS 118 (H) 04/06/2014 1328   AST 19 04/28/2019 1327   AST 18 04/06/2014 1328   ALT 19 04/28/2019 1327   ALT 32 04/06/2014 1328   BILITOT 0.6 04/28/2019 1327   BILITOT 0.4 04/06/2014 1328      RADIOGRAPHIC STUDIES: I have personally reviewed the radiological images as listed and agreed with the findings in the report. No results found.   ASSESSMENT & PLAN:  Carcinoma of lower-inner quadrant of right breast in female, estrogen receptor negative ([place]) Breast cancer stage III triple negative [2015]. Clinically no concern of recurrence; continue mammograms-August 2021.   #Bone density/osteoporosis surveillance discussed.recommend bone density test continue calcium plus vitamin  D.   # DISPOSITION: # mammoBMD- in aug 2021-  # Follow-up in 6 months- MD;labs- CBC  CMP/Ca-27-29- dr.B    Orders Placed This Encounter  Procedures  . MM 3D SCREEN BREAST BILATERAL    Standing Status:   Future    Standing Expiration Date:   06/25/2020    Order Specific Question:   Reason for Exam (SYMPTOM  OR DIAGNOSIS REQUIRED)    Answer:   breast cancer    Order Specific Question:   Is the patient pregnant?    Answer:   No    Order Specific Question:   Preferred imaging location?    Answer:   [facility]  . DG Bone Density    Standing Status:   Future    Standing Expiration Date:   04/27/2020    Order Specific Question:   Reason for Exam (SYMPTOM  OR DIAGNOSIS REQUIRED)    Answer:   osteoporosis screening    Order Specific Question:   Is the patient pregnant?    Answer:   No    Order Specific Question:   Preferred imaging location?    Answer:   [facility]  . CBC with Differential    Standing Status:   Future    Standing Expiration Date:   04/27/2020  . Comprehensive metabolic panel    Standing Status:   Future    Standing Expiration Date:   04/27/2020  . Cancer antigen 27.29    Standing Status:   Future    Standing Expiration Date:   04/27/2020   All questions were answered. The patient knows to call the clinic with any problems, questions or concerns.      Cammie Sickle, MD 04/28/2019 2:50 PM

## 2019-04-29 LAB — CANCER ANTIGEN 27.29: CA 27.29: 18.7 U/mL (ref 0.0–38.6)

## 2019-10-27 ENCOUNTER — Other Ambulatory Visit: Payer: 59

## 2019-10-29 ENCOUNTER — Other Ambulatory Visit: Payer: 59

## 2019-10-29 ENCOUNTER — Ambulatory Visit: Payer: 59 | Admitting: Internal Medicine

## 2019-11-04 ENCOUNTER — Other Ambulatory Visit: Payer: 59

## 2019-11-08 ENCOUNTER — Other Ambulatory Visit: Payer: Self-pay

## 2019-11-08 ENCOUNTER — Ambulatory Visit
Admission: RE | Admit: 2019-11-08 | Discharge: 2019-11-08 | Disposition: A | Discharge status: Home / Self Care | Payer: 59 | Source: Ambulatory Visit | Attending: Internal Medicine | Admitting: Internal Medicine

## 2019-11-08 DIAGNOSIS — C50311 Malignant neoplasm of lower-inner quadrant of right female breast: Secondary | ICD-10-CM | POA: Insufficient documentation

## 2019-11-08 DIAGNOSIS — Z171 Estrogen receptor negative status [ER-]: Secondary | ICD-10-CM

## 2019-11-09 ENCOUNTER — Telehealth: Payer: Self-pay | Admitting: *Deleted

## 2019-11-09 NOTE — Telephone Encounter (Signed)
Patient called requesting that lab be added to her appointment 11/19/19 . She wants Dr B to draw COVID antibodies. And states that she is fine. Please advise

## 2019-11-10 ENCOUNTER — Other Ambulatory Visit: Payer: 59

## 2019-11-10 ENCOUNTER — Other Ambulatory Visit: Payer: Self-pay | Admitting: Internal Medicine

## 2019-11-10 ENCOUNTER — Ambulatory Visit: Payer: 59 | Admitting: Internal Medicine

## 2019-11-10 DIAGNOSIS — Z1152 Encounter for screening for COVID-19: Secondary | ICD-10-CM

## 2019-11-10 DIAGNOSIS — Z20822 Contact with and (suspected) exposure to covid-19: Secondary | ICD-10-CM

## 2019-11-10 NOTE — Progress Notes (Signed)
I will speak to pt re: COVID antibody testing.  GB

## 2019-11-10 NOTE — Telephone Encounter (Signed)
See md response - separate phone note. He will reach out to patient.

## 2019-11-10 NOTE — Progress Notes (Signed)
Spoke to patient regarding her concerns for Covid antibody testing.  Patient suspected to have Covid in November 2020.  She did not get her Covid vaccination as she is concerned about potential adverse events.  However at the same time she is interested in checking her antibodies/although knowing is very unlikely that she has any protective titers of Covid antibodies.   Again reiterated the patient the importance of Covid vaccination; however still seems reluctant.  I have ordered the COVID serology. GB

## 2019-11-18 ENCOUNTER — Encounter: Payer: Self-pay | Admitting: Internal Medicine

## 2019-11-19 ENCOUNTER — Inpatient Hospital Stay: Payer: 59 | Attending: Internal Medicine

## 2019-11-19 ENCOUNTER — Encounter: Payer: Self-pay | Admitting: Internal Medicine

## 2019-11-19 ENCOUNTER — Inpatient Hospital Stay (HOSPITAL_BASED_OUTPATIENT_CLINIC_OR_DEPARTMENT_OTHER): Payer: 59 | Admitting: Internal Medicine

## 2019-11-19 ENCOUNTER — Other Ambulatory Visit: Payer: Self-pay

## 2019-11-19 DIAGNOSIS — C50311 Malignant neoplasm of lower-inner quadrant of right female breast: Secondary | ICD-10-CM

## 2019-11-19 DIAGNOSIS — Z9221 Personal history of antineoplastic chemotherapy: Secondary | ICD-10-CM | POA: Insufficient documentation

## 2019-11-19 DIAGNOSIS — Z171 Estrogen receptor negative status [ER-]: Secondary | ICD-10-CM | POA: Diagnosis not present

## 2019-11-19 DIAGNOSIS — Z853 Personal history of malignant neoplasm of breast: Secondary | ICD-10-CM | POA: Insufficient documentation

## 2019-11-19 DIAGNOSIS — Z08 Encounter for follow-up examination after completed treatment for malignant neoplasm: Secondary | ICD-10-CM | POA: Insufficient documentation

## 2019-11-19 DIAGNOSIS — Z923 Personal history of irradiation: Secondary | ICD-10-CM | POA: Insufficient documentation

## 2019-11-19 LAB — CBC WITH DIFFERENTIAL/PLATELET
Abs Immature Granulocytes: 0.33 10*3/uL — ABNORMAL HIGH (ref 0.00–0.07)
Basophils Absolute: 0.1 10*3/uL (ref 0.0–0.1)
Basophils Relative: 1 %
Eosinophils Absolute: 0.1 10*3/uL (ref 0.0–0.5)
Eosinophils Relative: 1 %
HCT: 40.9 % (ref 36.0–46.0)
Hemoglobin: 13.6 g/dL (ref 12.0–15.0)
Immature Granulocytes: 3 %
Lymphocytes Relative: 34 %
Lymphs Abs: 3.7 10*3/uL (ref 0.7–4.0)
MCH: 30.3 pg (ref 26.0–34.0)
MCHC: 33.3 g/dL (ref 30.0–36.0)
MCV: 91.1 fL (ref 80.0–100.0)
Monocytes Absolute: 0.8 10*3/uL (ref 0.1–1.0)
Monocytes Relative: 7 %
Neutro Abs: 5.9 10*3/uL (ref 1.7–7.7)
Neutrophils Relative %: 54 %
Platelets: 354 10*3/uL (ref 150–400)
RBC: 4.49 MIL/uL (ref 3.87–5.11)
RDW: 12.4 % (ref 11.5–15.5)
WBC: 10.8 10*3/uL — ABNORMAL HIGH (ref 4.0–10.5)
nRBC: 0 % (ref 0.0–0.2)

## 2019-11-19 LAB — COMPREHENSIVE METABOLIC PANEL
ALT: 20 U/L (ref 0–44)
AST: 21 U/L (ref 15–41)
Albumin: 4 g/dL (ref 3.5–5.0)
Alkaline Phosphatase: 94 U/L (ref 38–126)
Anion gap: 14 (ref 5–15)
BUN: 17 mg/dL (ref 8–23)
CO2: 23 mmol/L (ref 22–32)
Calcium: 9 mg/dL (ref 8.9–10.3)
Chloride: 99 mmol/L (ref 98–111)
Creatinine, Ser: 0.73 mg/dL (ref 0.44–1.00)
GFR calc Af Amer: >60 mL/min (ref >60)
GFR calc non Af Amer: >60 mL/min (ref >60)
Glucose, Bld: 162 mg/dL — ABNORMAL HIGH (ref 70–99)
Potassium: 3.3 mmol/L — ABNORMAL LOW (ref 3.5–5.1)
Sodium: 136 mmol/L (ref 135–145)
Total Bilirubin: 0.5 mg/dL (ref 0.3–1.2)
Total Protein: 7.1 g/dL (ref 6.5–8.1)

## 2019-11-19 NOTE — Progress Notes (Signed)
[name] OFFICE PROGRESS NOTE  Patient Care Team: Tracie Harrier, MD as PCP - General (Internal Medicine) Ammie Dalton, Okey Regal, MD (Unknown Physician Specialty) Bary Castilla Forest Gleason, MD (General Surgery)  Cancer Staging No matching staging information was found for the patient.   Oncology History Overview Note  1. Carcinoma of breast pT1c oN2a Mo  stage  IIIa.extensive lymphovascular invasion 6/14 lymph nodes uninvolved with extra capsular  invasion.Marland Kitchen     2.NEGATIVE FOR BRCA 1  AND 2 MUTATION  estrogen and progesterone receptor negative HER-2/neu receptor negative (triple negative disease) diagnosis in July of 2015.  Status post lumpectomy and axillary node dissection, 2. Started on chemotherapy with NSABP protocol is dose dense Cytoxan Adriamycin August of 2015 3.as per protocol patient was switched  to  carboplatinum and Taxol fromOctober 28, 2015 4.Patient has finished carboplatinum and Taxol in March 25, 2014 Would start radiation therapy to the breast in February of 2016  # GENETICS- [as per pt] NEG.  ----------------------------------------------------------------------------   DIAGNOSIS: BREAST CANCER  STAGE:  III    ;GOALS: cure  CURRENT/MOST RECENT THERAPY: surveillaince    Carcinoma of lower-inner quadrant of right breast in female, estrogen receptor negative ([place])     INTERVAL HISTORY:  Felicia Kidd 59 y.o.  female pleasant patient above history of Stage III triple negative breast cancer currently on surveillance Is here for follow-up/review mammogram.  Patient denies any new lumps or bumps.  Her appetite is good.  No headaches.  No nausea no vomiting.  Patient is concerned about getting COVID vaccination.  Review of Systems  Constitutional: Negative for chills, diaphoresis, fever, malaise/fatigue and weight loss.  HENT: Negative for nosebleeds and sore throat.   Eyes: Negative for double vision.  Respiratory: Negative for cough,  hemoptysis, sputum production, shortness of breath and wheezing.   Cardiovascular: Negative for chest pain, palpitations, orthopnea and leg swelling.  Gastrointestinal: Negative for abdominal pain, blood in stool, constipation, diarrhea, heartburn, melena, nausea and vomiting.  Genitourinary: Negative for dysuria, frequency and urgency.  Musculoskeletal: Negative for back pain and joint pain.  Skin: Negative.  Negative for itching and rash.  Neurological: Negative for dizziness, tingling, focal weakness, weakness and headaches.  Endo/Heme/Allergies: Does not bruise/bleed easily.  Psychiatric/Behavioral: Negative for depression. The patient is not nervous/anxious and does not have insomnia.    PAST MEDICAL HISTORY :  Past Medical History:  Diagnosis Date  . Allergy   . Asthma   . Breast cancer ([place]) 2015    right breast lumpectomy with chemo and rad tx  . Breast cancer of lower-inner quadrant of right female breast ([place]) 08/2013   Triple negative, T1c, N2a.(6/14 nodes +) Treated with Cytoxan and Adriamycin in a dose dense fashion followed by carbotaxol.    . Bronchitis   . Diabetes mellitus without complication ([place])   . Hyperlipidemia   . Hypertension   . Personal history of chemotherapy 10/2013   right breast ca  . Personal history of radiation therapy 04/2014   right breast ca  . Thyroid disease     PAST SURGICAL HISTORY :   Past Surgical History:  Procedure Laterality Date  . BREAST BIOPSY Right 09/07/2013   invasive mammary carcinoma  . 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 Clear margins  . BREAST SURGERY Right 09-27-13   Wide excision of Right breast lesion with attempted sentinel node biopsy followed by axillary dissection  . COLONOSCOPY N/A 02/23/2015  Procedure: COLONOSCOPY;  Surgeon: Manya Silvas, MD;  Location: [facility];  Service: Endoscopy;  Laterality: N/A;   Needs early AM - IDDM  . DILATION  AND CURETTAGE OF UTERUS     Dr Vernie Ammons  . PORTACATH PLACEMENT  11/02/2013   removed 11/02/2014.    FAMILY HISTORY :   Family History  Problem Relation Age of Onset  . Diabetes Mother   . Breast cancer Neg Hx   . Ovarian cancer Neg Hx   . Colon cancer Neg Hx     SOCIAL HISTORY:   Social History   Tobacco Use  . Smoking status: Never Smoker  . Smokeless tobacco: Never Used  Substance Use Topics  . Alcohol use: No  . Drug use: No    ALLERGIES:  has No Known Allergies.  MEDICATIONS:  Current Outpatient Medications  Medication Sig Dispense Refill  . acetaminophen (TYLENOL) 325 MG tablet Take 650 mg by mouth every 6 (six) hours as needed for moderate pain.     . celecoxib (CELEBREX) 200 MG capsule take 1 tablet daily  2  . clobetasol ointment (TEMOVATE) 2.75 % Apply 1 application topically once a week.  0  . fluticasone (FLONASE SENSIMIST) 27.5 MCG/SPRAY nasal spray Place into the nose.    . gabapentin (NEURONTIN) 300 MG capsule Take 300 mg by mouth 2 (two) times daily.    . Loratadine (CLARITIN) 10 MG CAPS Take by mouth.    . LUTEIN-ZEAXANTHIN PO Take 1 tablet by mouth daily.     . Magnesium 250 MG TABS Take 1 tablet by mouth 2 (two) times daily.     . metFORMIN (GLUCOPHAGE) 500 MG tablet TAKE 1 TABLET(500 MG) BY MOUTH TWICE DAILY    . montelukast (SINGULAIR) 10 MG tablet TAKE 1 TABLET(10 MG) BY MOUTH EVERY NIGHT    . Multiple Vitamins-Minerals (MULTIVITAMIN WITH MINERALS) tablet Take 1 tablet by mouth daily.    . Omega-3 Fatty Acids (FISH OIL PO) Take by mouth.    Marland Kitchen omeprazole (PRILOSEC) 20 MG capsule Take 20 mg by mouth 3 (three) times a week.     . telmisartan-hydrochlorothiazide (MICARDIS HCT) 40-12.5 MG tablet TAKE 1 TABLET BY MOUTH ONCE DAILY     No current facility-administered medications for this visit.   Facility-Administered Medications Ordered in Other Visits  Medication Dose Route Frequency Provider Last Rate Last Admin  . heparin lock flush 100 unit/mL  500  Units Intravenous Once Choksi, Janak, MD      . sodium chloride 0.9 % injection 10 mL  10 mL Intravenous PRN Choksi, Janak, MD      . sodium chloride 0.9 % injection 10 mL  10 mL Intravenous PRN Forest Gleason, MD   10 mL at 10/26/14 0940    PHYSICAL EXAMINATION: ECOG PERFORMANCE STATUS: 0 - Asymptomatic  BP (!) 155/59 (BP Location: Left Arm, Patient Position: Sitting, Cuff Size: Large)   Pulse 87   Temp (!) 97.1 F (36.2 C) (Tympanic)   Resp 18   Ht 5' (1.524 m)   Wt 241 lb (109.3 kg)   SpO2 100%   BMI 47.07 kg/m   Filed Weights   11/19/19 0927  Weight: 241 lb (109.3 kg)    Physical Exam HENT:     Head: Normocephalic and atraumatic.     Mouth/Throat:     Pharynx: No oropharyngeal exudate.  Eyes:     Pupils: Pupils are equal, round, and reactive to light.  Cardiovascular:     Rate and Rhythm:  Normal rate and regular rhythm.  Pulmonary:     Effort: Pulmonary effort is normal. No respiratory distress.     Breath sounds: Normal breath sounds. No wheezing.  Abdominal:     General: Bowel sounds are normal. There is no distension.     Palpations: Abdomen is soft. There is no mass.     Tenderness: There is no abdominal tenderness. There is no guarding or rebound.  Musculoskeletal:        General: No tenderness. Normal range of motion.     Cervical back: Normal range of motion and neck supple.  Skin:    General: Skin is warm.  Neurological:     Mental Status: She is alert and oriented to person, place, and time.  Psychiatric:        Mood and Affect: Affect normal.    LABORATORY DATA:  I have reviewed the data as listed    Component Value Date/Time   NA 136 11/19/2019 0842   NA 138 04/06/2014 1328   K 3.3 (L) 11/19/2019 0842   K 4.1 04/06/2014 1328   CL 99 11/19/2019 0842   CL 101 04/06/2014 1328   CO2 23 11/19/2019 0842   CO2 30 04/06/2014 1328   GLUCOSE 162 (H) 11/19/2019 0842   GLUCOSE 105 (H) 04/06/2014 1328   BUN 17 11/19/2019 0842   BUN 8 04/06/2014 1328    CREATININE 0.73 11/19/2019 0842   CREATININE 0.62 04/06/2014 1328   CALCIUM 9.0 11/19/2019 0842   CALCIUM 8.5 04/06/2014 1328   PROT 7.1 11/19/2019 0842   PROT 6.7 04/06/2014 1328   ALBUMIN 4.0 11/19/2019 0842   ALBUMIN 3.3 (L) 04/06/2014 1328   AST 21 11/19/2019 0842   AST 18 04/06/2014 1328   ALT 20 11/19/2019 0842   ALT 32 04/06/2014 1328   ALKPHOS 94 11/19/2019 0842   ALKPHOS 118 (H) 04/06/2014 1328   BILITOT 0.5 11/19/2019 0842   BILITOT 0.4 04/06/2014 1328   GFRNONAA >60 11/19/2019 0842   GFRNONAA >60 04/06/2014 1328   GFRNONAA >60 12/01/2013 0926   GFRAA >60 11/19/2019 0842   GFRAA >60 04/06/2014 1328   GFRAA >60 12/01/2013 0926    No results found for: SPEP, UPEP  Lab Results  Component Value Date   WBC 10.8 (H) 11/19/2019   NEUTROABS 5.9 11/19/2019   HGB 13.6 11/19/2019   HCT 40.9 11/19/2019   MCV 91.1 11/19/2019   PLT 354 11/19/2019      Chemistry      Component Value Date/Time   NA 136 11/19/2019 0842   NA 138 04/06/2014 1328   K 3.3 (L) 11/19/2019 0842   K 4.1 04/06/2014 1328   CL 99 11/19/2019 0842   CL 101 04/06/2014 1328   CO2 23 11/19/2019 0842   CO2 30 04/06/2014 1328   BUN 17 11/19/2019 0842   BUN 8 04/06/2014 1328   CREATININE 0.73 11/19/2019 0842   CREATININE 0.62 04/06/2014 1328      Component Value Date/Time   CALCIUM 9.0 11/19/2019 0842   CALCIUM 8.5 04/06/2014 1328   ALKPHOS 94 11/19/2019 0842   ALKPHOS 118 (H) 04/06/2014 1328   AST 21 11/19/2019 0842   AST 18 04/06/2014 1328   ALT 20 11/19/2019 0842   ALT 32 04/06/2014 1328   BILITOT 0.5 11/19/2019 0842   BILITOT 0.4 04/06/2014 1328      RADIOGRAPHIC STUDIES: I have personally reviewed the radiological images as listed and agreed with the findings in the report. No  results found.   ASSESSMENT & PLAN:  Carcinoma of lower-inner quadrant of right breast in female, estrogen receptor negative ([place]) Breast cancer stage III triple negative [2015]. Clinically no concern of  recurrence; stable.  mammograms-August 2021-no evidence of any new mass or calcifications/reviewed.  # Bone mineral density -Aug 2021- T score-.0.4- Bone density/osteoporosis surveillance discussed.  Continue calcium plus vitamin D.   #Unfortunately patient is unvaccinated to [place].  Concerned about potential adverse events.  I again educated the patient regarding the concerns for extremely high risk of infection/serious complications from the new variants.  Benefits of Covid vaccine outweigh the risk at any time; and strongly recommend COVID-19 vaccination.  Patient finally agrees to have the vaccination.   # DISPOSITION: # Follow-up in 6 months- MD;labs- CBC CMP/Ca-27-29- dr.B    Orders Placed This Encounter  Procedures  . CBC with Differential/Platelet    Standing Status:   Future    Standing Expiration Date:   11/18/2020  . Comprehensive metabolic panel    Standing Status:   Future    Standing Expiration Date:   11/18/2020  . Cancer antigen 27.29    Standing Status:   Future    Standing Expiration Date:   11/18/2020   All questions were answered. The patient knows to call the clinic with any problems, questions or concerns.      Cammie Sickle, MD 11/19/2019 12:52 PM

## 2019-11-19 NOTE — Assessment & Plan Note (Addendum)
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 Clinically no concern of recurrence; stable.  mammograms-August 2021-no evidence of any new mass or calcifications/reviewed.  # Bone mineral density -Aug 2021- T score-.0.4- Bone density/osteoporosis surveillance discussed.  Continue calcium plus vitamin D.   #Unfortunately patient is unvaccinated to [place].  Concerned about potential adverse events.  I again educated the patient regarding the concerns for extremely high risk of infection/serious complications from the new variants.  Benefits of Covid vaccine outweigh the risk at any time; and strongly recommend COVID-19 vaccination.  Patient finally agrees to have the vaccination.   # DISPOSITION: # Follow-up in 6 months- MD;labs- CBC CMP/Ca-27-29- dr.B

## 2019-11-20 LAB — CANCER ANTIGEN 27.29: CA 27.29: 22.4 U/mL (ref 0.0–38.6)

## 2020-05-18 ENCOUNTER — Ambulatory Visit: Payer: 59 | Admitting: Internal Medicine

## 2020-05-18 ENCOUNTER — Other Ambulatory Visit: Payer: 59

## 2020-05-19 ENCOUNTER — Ambulatory Visit: Payer: 59 | Admitting: Internal Medicine

## 2020-05-19 ENCOUNTER — Other Ambulatory Visit: Payer: 59

## 2020-05-29 ENCOUNTER — Encounter: Payer: Self-pay | Admitting: Internal Medicine

## 2020-05-29 ENCOUNTER — Inpatient Hospital Stay: Payer: 59 | Attending: Internal Medicine

## 2020-05-29 ENCOUNTER — Other Ambulatory Visit: Payer: Self-pay

## 2020-05-29 ENCOUNTER — Inpatient Hospital Stay (HOSPITAL_BASED_OUTPATIENT_CLINIC_OR_DEPARTMENT_OTHER): Payer: 59 | Admitting: Internal Medicine

## 2020-05-29 DIAGNOSIS — E785 Hyperlipidemia, unspecified: Secondary | ICD-10-CM | POA: Insufficient documentation

## 2020-05-29 DIAGNOSIS — Z923 Personal history of irradiation: Secondary | ICD-10-CM | POA: Diagnosis not present

## 2020-05-29 DIAGNOSIS — Z9221 Personal history of antineoplastic chemotherapy: Secondary | ICD-10-CM | POA: Diagnosis not present

## 2020-05-29 DIAGNOSIS — Z79899 Other long term (current) drug therapy: Secondary | ICD-10-CM | POA: Insufficient documentation

## 2020-05-29 DIAGNOSIS — C50311 Malignant neoplasm of lower-inner quadrant of right female breast: Secondary | ICD-10-CM | POA: Diagnosis not present

## 2020-05-29 DIAGNOSIS — Z171 Estrogen receptor negative status [ER-]: Secondary | ICD-10-CM | POA: Diagnosis not present

## 2020-05-29 DIAGNOSIS — E119 Type 2 diabetes mellitus without complications: Secondary | ICD-10-CM | POA: Insufficient documentation

## 2020-05-29 DIAGNOSIS — I10 Essential (primary) hypertension: Secondary | ICD-10-CM | POA: Diagnosis not present

## 2020-05-29 DIAGNOSIS — Z7984 Long term (current) use of oral hypoglycemic drugs: Secondary | ICD-10-CM | POA: Diagnosis not present

## 2020-05-29 DIAGNOSIS — C50211 Malignant neoplasm of upper-inner quadrant of right female breast: Secondary | ICD-10-CM | POA: Diagnosis not present

## 2020-05-29 LAB — CBC WITH DIFFERENTIAL/PLATELET
Abs Immature Granulocytes: 0.11 10*3/uL — ABNORMAL HIGH (ref 0.00–0.07)
Basophils Absolute: 0.1 10*3/uL (ref 0.0–0.1)
Basophils Relative: 0 %
Eosinophils Absolute: 0.3 10*3/uL (ref 0.0–0.5)
Eosinophils Relative: 3 %
HCT: 41 % (ref 36.0–46.0)
Hemoglobin: 13.5 g/dL (ref 12.0–15.0)
Immature Granulocytes: 1 %
Lymphocytes Relative: 28 %
Lymphs Abs: 3.3 10*3/uL (ref 0.7–4.0)
MCH: 30.3 pg (ref 26.0–34.0)
MCHC: 32.9 g/dL (ref 30.0–36.0)
MCV: 91.9 fL (ref 80.0–100.0)
Monocytes Absolute: 0.7 10*3/uL (ref 0.1–1.0)
Monocytes Relative: 6 %
Neutro Abs: 7.3 10*3/uL (ref 1.7–7.7)
Neutrophils Relative %: 62 %
Platelets: 336 10*3/uL (ref 150–400)
RBC: 4.46 MIL/uL (ref 3.87–5.11)
RDW: 12.6 % (ref 11.5–15.5)
WBC: 11.8 10*3/uL — ABNORMAL HIGH (ref 4.0–10.5)
nRBC: 0 % (ref 0.0–0.2)

## 2020-05-29 LAB — COMPREHENSIVE METABOLIC PANEL
ALT: 17 U/L (ref 0–44)
AST: 19 U/L (ref 15–41)
Albumin: 4 g/dL (ref 3.5–5.0)
Alkaline Phosphatase: 102 U/L (ref 38–126)
Anion gap: 11 (ref 5–15)
BUN: 15 mg/dL (ref 8–23)
CO2: 24 mmol/L (ref 22–32)
Calcium: 9 mg/dL (ref 8.9–10.3)
Chloride: 99 mmol/L (ref 98–111)
Creatinine, Ser: 0.65 mg/dL (ref 0.44–1.00)
GFR, Estimated: >60 mL/min (ref >60)
Glucose, Bld: 132 mg/dL — ABNORMAL HIGH (ref 70–99)
Potassium: 3.8 mmol/L (ref 3.5–5.1)
Sodium: 134 mmol/L — ABNORMAL LOW (ref 135–145)
Total Bilirubin: 0.6 mg/dL (ref 0.3–1.2)
Total Protein: 7.1 g/dL (ref 6.5–8.1)

## 2020-05-29 NOTE — Assessment & Plan Note (Addendum)
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 Clinically no concern of recurrence- STABLE mammograms-August 2021.  Ordered mammogram August 2022.  Discussed plan annual visits/next visit  # Bone mineral density -Aug 2021- T score-.0.4- Bone density/osteoporosis surveillance discussed.  Continue calcium plus vitamin D.   #Unfortunately patient is unvaccinated to Covid-19;DECLINED.   # Mild Neutrophillia-white count 11 normal hemoglobin/platelets likely reactive.  # Weight gain- recommend exercise/weight loss.  *42m # DISPOSITION: # Mammogram bil- AUG 2022 # Follow-up in 6 months- MD;labs- CBC CMP/Ca-27-29- dr.B

## 2020-05-29 NOTE — Progress Notes (Signed)
[name]  Patient Care Team: Tracie Harrier, MD as PCP - General (Internal Medicine) Ammie Dalton, Okey Regal, MD (Unknown Physician Specialty) Bary Castilla Forest Gleason, MD (General Surgery)  Cancer Staging No matching staging information was found for the patient.   Oncology History Overview Note  1. Carcinoma of breast pT1c oN2a Mo  stage  IIIa.extensive lymphovascular invasion 6/14 lymph nodes uninvolved with extra capsular  invasion.Marland Kitchen     2.NEGATIVE FOR BRCA 1  AND 2 MUTATION  estrogen and progesterone receptor negative HER-2/neu receptor negative (triple negative disease) diagnosis in July of 2015.  Status post lumpectomy and axillary node dissection, 2. Started on chemotherapy with NSABP protocol is dose dense Cytoxan Adriamycin August of 2015 3.as per protocol patient was switched  to  carboplatinum and Taxol fromOctober 28, 2015 4.Patient has finished carboplatinum and Taxol in March 25, 2014 Would start radiation therapy to the breast in February of 2016  # GENETICS- [as per pt] NEG.  ----------------------------------------------------------------------------   DIAGNOSIS: BREAST CANCER  STAGE:  III    ;GOALS: cure  CURRENT/MOST RECENT THERAPY: surveillaince    Carcinoma of lower-inner quadrant of right breast in female, estrogen receptor negative ([place])     INTERVAL HISTORY:  Felicia Kidd 60 y.o.  female pleasant patient above history of Stage III triple negative breast cancer currently on surveillance Is here for follow-up.  Patient denies any new lumps or bumps.  Appetite is good.  No weight loss.  No headaches.  No nausea no vomiting.   Review of Systems  Constitutional: Negative for chills, diaphoresis, fever, malaise/fatigue and weight loss.  HENT: Negative for nosebleeds and sore throat.   Eyes: Negative for double vision.  Respiratory: Negative for cough, hemoptysis, sputum production, shortness of breath and  wheezing.   Cardiovascular: Negative for chest pain, palpitations, orthopnea and leg swelling.  Gastrointestinal: Negative for abdominal pain, blood in stool, constipation, diarrhea, heartburn, melena, nausea and vomiting.  Genitourinary: Negative for dysuria, frequency and urgency.  Musculoskeletal: Negative for back pain and joint pain.  Skin: Negative.  Negative for itching and rash.  Neurological: Negative for dizziness, tingling, focal weakness, weakness and headaches.  Endo/Heme/Allergies: Does not bruise/bleed easily.  Psychiatric/Behavioral: Negative for depression. The patient is not nervous/anxious and does not have insomnia.    PAST MEDICAL HISTORY :  Past Medical History:  Diagnosis Date  . Allergy   . Asthma   . Breast cancer ([place]) 2015    right breast lumpectomy with chemo and rad tx  . Breast cancer of lower-inner quadrant of right female breast ([place]) 08/2013   Triple negative, T1c, N2a.(6/14 nodes +) Treated with Cytoxan and Adriamycin in a dose dense fashion followed by carbotaxol.    . Bronchitis   . Diabetes mellitus without complication ([place])   . Hyperlipidemia   . Hypertension   . Personal history of chemotherapy 10/2013   right breast ca  . Personal history of radiation therapy 04/2014   right breast ca  . Thyroid disease     PAST SURGICAL HISTORY :   Past Surgical History:  Procedure Laterality Date  . BREAST BIOPSY Right 09/07/2013   invasive mammary carcinoma  . 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 Clear margins  . BREAST SURGERY Right 09-27-13   Wide excision of Right breast lesion with attempted sentinel node biopsy followed by axillary dissection  . COLONOSCOPY N/A 02/23/2015   Procedure: COLONOSCOPY;  Surgeon: Herbie Baltimore  T Elliott, MD;  Location: [facility] ENDOSCOPY;  Service: Endoscopy;  Laterality: N/A;   Needs early AM - IDDM  . DILATION AND CURETTAGE OF UTERUS     Dr VanDalen  . PORTACATH  PLACEMENT  11/02/2013   removed 11/02/2014.    FAMILY HISTORY :   Family History  Problem Relation Age of Onset  . Diabetes Mother   . Breast cancer Neg Hx   . Ovarian cancer Neg Hx   . Colon cancer Neg Hx     SOCIAL HISTORY:   Social History   Tobacco Use  . Smoking status: Never Smoker  . Smokeless tobacco: Never Used  Substance Use Topics  . Alcohol use: No  . Drug use: No    ALLERGIES:  has No Known Allergies.  MEDICATIONS:  Current Outpatient Medications  Medication Sig Dispense Refill  . acetaminophen (TYLENOL) 325 MG tablet Take 650 mg by mouth every 6 (six) hours as needed for moderate pain.     . celecoxib (CELEBREX) 200 MG capsule take 1 tablet daily  2  . clobetasol ointment (TEMOVATE) 0.05 % Apply 1 application topically once a week.  0  . fluticasone (VERAMYST) 27.5 MCG/SPRAY nasal spray Place into the nose.    . gabapentin (NEURONTIN) 300 MG capsule Take 300 mg by mouth 2 (two) times daily.    . Loratadine 10 MG CAPS Take by mouth.    . LUTEIN-ZEAXANTHIN PO Take 1 tablet by mouth daily.     . Magnesium 250 MG TABS Take 1 tablet by mouth 2 (two) times daily.     . metFORMIN (GLUCOPHAGE) 500 MG tablet TAKE 1 TABLET(500 MG) BY MOUTH TWICE DAILY    . montelukast (SINGULAIR) 10 MG tablet TAKE 1 TABLET(10 MG) BY MOUTH EVERY NIGHT    . Multiple Vitamins-Minerals (MULTIVITAMIN WITH MINERALS) tablet Take 1 tablet by mouth daily.    . Omega-3 Fatty Acids (FISH OIL PO) Take by mouth.    . omeprazole (PRILOSEC) 20 MG capsule Take 20 mg by mouth 3 (three) times a week.     . pravastatin (PRAVACHOL) 10 MG tablet Take by mouth.    . telmisartan-hydrochlorothiazide (MICARDIS HCT) 40-12.5 MG tablet TAKE 1 TABLET BY MOUTH ONCE DAILY    . losartan-hydrochlorothiazide (HYZAAR) 50-12.5 MG tablet Take 1 tablet by mouth daily. (Patient not taking: Reported on 05/29/2020)     No current facility-administered medications for this visit.   Facility-Administered Medications Ordered  in Other Visits  Medication Dose Route Frequency Provider Last Rate Last Admin  . heparin lock flush 100 unit/mL  500 Units Intravenous Once Choksi, Janak, MD      . sodium chloride 0.9 % injection 10 mL  10 mL Intravenous PRN Choksi, Janak, MD      . sodium chloride 0.9 % injection 10 mL  10 mL Intravenous PRN Choksi, Janak, MD   10 mL at 10/26/14 0940    PHYSICAL EXAMINATION: ECOG PERFORMANCE STATUS: 0 - Asymptomatic  BP (!) 155/77 (BP Location: Left Arm, Patient Position: Sitting, Cuff Size: Normal)   Pulse 83   Temp (!) 96.7 F (35.9 C) (Tympanic)   Resp 16   Ht 5' (1.524 m)   Wt 248 lb (112.5 kg)   SpO2 99%   BMI 48.43 kg/m   Filed Weights   05/29/20 0958  Weight: 248 lb (112.5 kg)    Physical Exam HENT:     Head: Normocephalic and atraumatic.     Mouth/Throat:     Pharynx: No   oropharyngeal exudate.  Eyes:     Pupils: Pupils are equal, round, and reactive to light.  Cardiovascular:     Rate and Rhythm: Normal rate and regular rhythm.  Pulmonary:     Effort: Pulmonary effort is normal. No respiratory distress.     Breath sounds: Normal breath sounds. No wheezing.  Abdominal:     General: Bowel sounds are normal. There is no distension.     Palpations: Abdomen is soft. There is no mass.     Tenderness: There is no abdominal tenderness. There is no guarding or rebound.  Musculoskeletal:        General: No tenderness. Normal range of motion.     Cervical back: Normal range of motion and neck supple.  Skin:    General: Skin is warm.  Neurological:     Mental Status: She is alert and oriented to person, place, and time.  Psychiatric:        Mood and Affect: Affect normal.    LABORATORY DATA:  I have reviewed the data as listed    Component Value Date/Time   NA 134 (L) 05/29/2020 0907   NA 138 04/06/2014 1328   K 3.8 05/29/2020 0907   K 4.1 04/06/2014 1328   CL 99 05/29/2020 0907   CL 101 04/06/2014 1328   CO2 24 05/29/2020 0907   CO2 30 04/06/2014 1328    GLUCOSE 132 (H) 05/29/2020 0907   GLUCOSE 105 (H) 04/06/2014 1328   BUN 15 05/29/2020 0907   BUN 8 04/06/2014 1328   CREATININE 0.65 05/29/2020 0907   CREATININE 0.62 04/06/2014 1328   CALCIUM 9.0 05/29/2020 0907   CALCIUM 8.5 04/06/2014 1328   PROT 7.1 05/29/2020 0907   PROT 6.7 04/06/2014 1328   ALBUMIN 4.0 05/29/2020 0907   ALBUMIN 3.3 (L) 04/06/2014 1328   AST 19 05/29/2020 0907   AST 18 04/06/2014 1328   ALT 17 05/29/2020 0907   ALT 32 04/06/2014 1328   ALKPHOS 102 05/29/2020 0907   ALKPHOS 118 (H) 04/06/2014 1328   BILITOT 0.6 05/29/2020 0907   BILITOT 0.4 04/06/2014 1328   GFRNONAA >60 05/29/2020 0907   GFRNONAA >60 04/06/2014 1328   GFRNONAA >60 12/01/2013 0926   GFRAA >60 11/19/2019 0842   GFRAA >60 04/06/2014 1328   GFRAA >60 12/01/2013 0926    No results found for: SPEP, UPEP  Lab Results  Component Value Date   WBC 11.8 (H) 05/29/2020   NEUTROABS 7.3 05/29/2020   HGB 13.5 05/29/2020   HCT 41.0 05/29/2020   MCV 91.9 05/29/2020   PLT 336 05/29/2020      Chemistry      Component Value Date/Time   NA 134 (L) 05/29/2020 0907   NA 138 04/06/2014 1328   K 3.8 05/29/2020 0907   K 4.1 04/06/2014 1328   CL 99 05/29/2020 0907   CL 101 04/06/2014 1328   CO2 24 05/29/2020 0907   CO2 30 04/06/2014 1328   BUN 15 05/29/2020 0907   BUN 8 04/06/2014 1328   CREATININE 0.65 05/29/2020 0907   CREATININE 0.62 04/06/2014 1328      Component Value Date/Time   CALCIUM 9.0 05/29/2020 0907   CALCIUM 8.5 04/06/2014 1328   ALKPHOS 102 05/29/2020 0907   ALKPHOS 118 (H) 04/06/2014 1328   AST 19 05/29/2020 0907   AST 18 04/06/2014 1328   ALT 17 05/29/2020 0907   ALT 32 04/06/2014 1328   BILITOT 0.6 05/29/2020 0907   BILITOT 0.4 04/06/2014  1328      RADIOGRAPHIC STUDIES: I have personally reviewed the radiological images as listed and agreed with the findings in the report. No results found.   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 Clinically no concern of recurrence- STABLE mammograms-August 2021.  Ordered mammogram August 2022.  Discussed plan annual visits/next visit  # Bone mineral density -Aug 2021- T score-.0.4- Bone density/osteoporosis surveillance discussed.  Continue calcium plus vitamin D.   #Unfortunately patient is unvaccinated to Covid-19;DECLINED.   # Mild Neutrophillia-white count 11 normal hemoglobin/platelets likely reactive.  # Weight gain- recommend exercise/weight loss.  *12m # DISPOSITION: # Mammogram bil- AUG 2022 # Follow-up in 6 months- MD;labs- CBC CMP/Ca-27-29- dr.B    Orders Placed This Encounter  Procedures  . MM 3D SCREEN BREAST BILATERAL    Standing Status:   Future    Standing Expiration Date:   05/29/2021    Order Specific Question:   Reason for Exam (SYMPTOM  OR DIAGNOSIS REQUIRED)    Answer:   Breast cancer    Order Specific Question:   Preferred imaging location?    Answer:   [facility]  . CBC with Differential/Platelet    Standing Status:   Future    Standing Expiration Date:   05/29/2021  . Comprehensive metabolic panel    Standing Status:   Future    Standing Expiration Date:   05/29/2021  . Cancer antigen 27.29    Standing Status:   Future    Standing Expiration Date:   05/29/2021   All questions were answered. The patient knows to call the clinic with any problems, questions or concerns.      Govinda R Brahmanday, MD 05/29/2020 12:38 PM 

## 2020-05-30 LAB — CANCER ANTIGEN 27.29: CA 27.29: 17 U/mL (ref 0.0–38.6)

## 2020-11-08 ENCOUNTER — Other Ambulatory Visit: Payer: Self-pay

## 2020-11-08 ENCOUNTER — Ambulatory Visit
Admission: RE | Admit: 2020-11-08 | Discharge: 2020-11-08 | Disposition: A | Discharge status: Home / Self Care | Payer: 59 | Source: Ambulatory Visit | Attending: Internal Medicine | Admitting: Internal Medicine

## 2020-11-08 DIAGNOSIS — Z1231 Encounter for screening mammogram for malignant neoplasm of breast: Secondary | ICD-10-CM | POA: Diagnosis not present

## 2020-11-08 DIAGNOSIS — C50311 Malignant neoplasm of lower-inner quadrant of right female breast: Secondary | ICD-10-CM | POA: Diagnosis present

## 2020-11-08 DIAGNOSIS — Z171 Estrogen receptor negative status [ER-]: Secondary | ICD-10-CM | POA: Diagnosis present

## 2020-11-27 ENCOUNTER — Encounter: Payer: Self-pay | Admitting: Internal Medicine

## 2020-11-27 ENCOUNTER — Inpatient Hospital Stay: Payer: 59 | Attending: Internal Medicine

## 2020-11-27 ENCOUNTER — Inpatient Hospital Stay (HOSPITAL_BASED_OUTPATIENT_CLINIC_OR_DEPARTMENT_OTHER): Payer: 59 | Admitting: Internal Medicine

## 2020-11-27 ENCOUNTER — Other Ambulatory Visit: Payer: Self-pay

## 2020-11-27 VITALS — BP 154/85 | HR 82 | Temp 97.5°F | Resp 20 | Ht 60.0 in | Wt 248.0 lb

## 2020-11-27 DIAGNOSIS — J45909 Unspecified asthma, uncomplicated: Secondary | ICD-10-CM | POA: Insufficient documentation

## 2020-11-27 DIAGNOSIS — E785 Hyperlipidemia, unspecified: Secondary | ICD-10-CM | POA: Insufficient documentation

## 2020-11-27 DIAGNOSIS — C50311 Malignant neoplasm of lower-inner quadrant of right female breast: Secondary | ICD-10-CM | POA: Diagnosis not present

## 2020-11-27 DIAGNOSIS — Z923 Personal history of irradiation: Secondary | ICD-10-CM | POA: Diagnosis not present

## 2020-11-27 DIAGNOSIS — E079 Disorder of thyroid, unspecified: Secondary | ICD-10-CM | POA: Diagnosis not present

## 2020-11-27 DIAGNOSIS — Z9221 Personal history of antineoplastic chemotherapy: Secondary | ICD-10-CM | POA: Insufficient documentation

## 2020-11-27 DIAGNOSIS — Z79899 Other long term (current) drug therapy: Secondary | ICD-10-CM | POA: Diagnosis not present

## 2020-11-27 DIAGNOSIS — Z171 Estrogen receptor negative status [ER-]: Secondary | ICD-10-CM

## 2020-11-27 DIAGNOSIS — E119 Type 2 diabetes mellitus without complications: Secondary | ICD-10-CM | POA: Insufficient documentation

## 2020-11-27 DIAGNOSIS — Z13820 Encounter for screening for osteoporosis: Secondary | ICD-10-CM | POA: Diagnosis not present

## 2020-11-27 DIAGNOSIS — I10 Essential (primary) hypertension: Secondary | ICD-10-CM | POA: Insufficient documentation

## 2020-11-27 DIAGNOSIS — Z853 Personal history of malignant neoplasm of breast: Secondary | ICD-10-CM | POA: Insufficient documentation

## 2020-11-27 DIAGNOSIS — Z7984 Long term (current) use of oral hypoglycemic drugs: Secondary | ICD-10-CM | POA: Insufficient documentation

## 2020-11-27 LAB — COMPREHENSIVE METABOLIC PANEL
ALT: 17 U/L (ref 0–44)
AST: 20 U/L (ref 15–41)
Albumin: 4.1 g/dL (ref 3.5–5.0)
Alkaline Phosphatase: 113 U/L (ref 38–126)
Anion gap: 8 (ref 5–15)
BUN: 16 mg/dL (ref 8–23)
CO2: 27 mmol/L (ref 22–32)
Calcium: 9.1 mg/dL (ref 8.9–10.3)
Chloride: 101 mmol/L (ref 98–111)
Creatinine, Ser: 0.54 mg/dL (ref 0.44–1.00)
GFR, Estimated: >60 mL/min (ref >60)
Glucose, Bld: 114 mg/dL — ABNORMAL HIGH (ref 70–99)
Potassium: 3.9 mmol/L (ref 3.5–5.1)
Sodium: 136 mmol/L (ref 135–145)
Total Bilirubin: 0.7 mg/dL (ref 0.3–1.2)
Total Protein: 7.1 g/dL (ref 6.5–8.1)

## 2020-11-27 LAB — CBC WITH DIFFERENTIAL/PLATELET
Abs Immature Granulocytes: 0.11 10*3/uL — ABNORMAL HIGH (ref 0.00–0.07)
Basophils Absolute: 0 10*3/uL (ref 0.0–0.1)
Basophils Relative: 0 %
Eosinophils Absolute: 0.3 10*3/uL (ref 0.0–0.5)
Eosinophils Relative: 3 %
HCT: 38.3 % (ref 36.0–46.0)
Hemoglobin: 12.9 g/dL (ref 12.0–15.0)
Immature Granulocytes: 1 %
Lymphocytes Relative: 29 %
Lymphs Abs: 2.9 10*3/uL (ref 0.7–4.0)
MCH: 30.5 pg (ref 26.0–34.0)
MCHC: 33.7 g/dL (ref 30.0–36.0)
MCV: 90.5 fL (ref 80.0–100.0)
Monocytes Absolute: 0.6 10*3/uL (ref 0.1–1.0)
Monocytes Relative: 6 %
Neutro Abs: 6.1 10*3/uL (ref 1.7–7.7)
Neutrophils Relative %: 61 %
Platelets: 338 10*3/uL (ref 150–400)
RBC: 4.23 MIL/uL (ref 3.87–5.11)
RDW: 12.6 % (ref 11.5–15.5)
WBC: 10 10*3/uL (ref 4.0–10.5)
nRBC: 0 % (ref 0.0–0.2)

## 2020-11-27 NOTE — Progress Notes (Signed)
[place] OFFICE PROGRESS NOTE  Patient Care Team: Tracie Harrier, MD as PCP - General (Internal Medicine) Ammie Dalton, Okey Regal, MD (Unknown Physician Specialty) Bary Castilla Forest Gleason, MD (General Surgery)  Cancer Staging No matching staging information was found for the patient.   Oncology History Overview Note  1. Carcinoma of breast pT1c oN2a Mo  stage  IIIa.extensive lymphovascular invasion 6/14 lymph nodes uninvolved with extra capsular  invasion.Marland Kitchen     2.NEGATIVE FOR BRCA 1  AND 2 MUTATION  estrogen and progesterone receptor negative HER-2/neu receptor negative (triple negative disease) diagnosis in July of 2015.  Status post lumpectomy and axillary node dissection, 2. Started on chemotherapy with NSABP protocol is dose dense Cytoxan Adriamycin August of 2015 3.as per protocol patient was switched  to  carboplatinum and Taxol fromOctober 28, 2015 4.Patient has finished carboplatinum and Taxol in March 25, 2014 Would start radiation therapy to the breast in February of 2016  # GENETICS- [as per pt] NEG.  ----------------------------------------------------------------------------   DIAGNOSIS: BREAST CANCER  STAGE:  III    ;GOALS: cure  CURRENT/MOST RECENT THERAPY: surveillaince    Carcinoma of lower-inner quadrant of right breast in female, estrogen receptor negative ([place])     INTERVAL HISTORY: Ambulating independently.  Alone.  Felicia Kidd 60 y.o.  female pleasant patient above history of Stage III triple negative breast cancer currently on surveillance Is here for follow-up.  No nausea no vomiting no headache.  No chest pain.  No bone pain.    Review of Systems  Constitutional:  Negative for chills, diaphoresis, fever, malaise/fatigue and weight loss.  HENT:  Negative for nosebleeds and sore throat.   Eyes:  Negative for double vision.  Respiratory:  Negative for cough, hemoptysis, sputum production, shortness of breath and wheezing.    Cardiovascular:  Negative for chest pain, palpitations, orthopnea and leg swelling.  Gastrointestinal:  Negative for abdominal pain, blood in stool, constipation, diarrhea, heartburn, melena, nausea and vomiting.  Genitourinary:  Negative for dysuria, frequency and urgency.  Musculoskeletal:  Positive for joint pain. Negative for back pain.  Skin: Negative.  Negative for itching and rash.  Neurological:  Negative for dizziness, tingling, focal weakness, weakness and headaches.  Endo/Heme/Allergies:  Does not bruise/bleed easily.  Psychiatric/Behavioral:  Negative for depression. The patient is not nervous/anxious and does not have insomnia.   PAST MEDICAL HISTORY :  Past Medical History:  Diagnosis Date   Allergy    Asthma    Breast cancer ([place]) 2015    right breast lumpectomy with chemo and rad tx   Breast cancer of lower-inner quadrant of right female breast ([place]) 08/2013   Triple negative, T1c, N2a.(6/14 nodes +) Treated with Cytoxan and Adriamycin in a dose dense fashion followed by carbotaxol.     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 Clear margins   BREAST SURGERY Right 09-27-13   Wide excision of Right breast lesion with attempted sentinel node biopsy followed by axillary dissection   COLONOSCOPY N/A 02/23/2015  Procedure: COLONOSCOPY;  Surgeon: Manya Silvas, MD;  Location[facility] END[facility]COPY;  Service: Endoscopy;  Laterality: N/A;   Needs early AM - IDDM   DILATION AND CURETTAGE OF UTERUS     Dr Vernie Ammons   PORTACATH PLACEMENT   11/02/2013   removed 11/02/2014.    FAMILY HISTORY :   Family History  Problem Relation Age of Onset   Diabetes Mother    Breast cancer Neg Hx    Ovarian cancer Neg Hx    Colon cancer Neg Hx     SOCIAL HISTORY:   Social History   Tobacco Use   Smoking status: Never   Smokeless tobacco: Never  Substance Use Topics   Alcohol use: No   Drug use: No    ALLERGIES:  is allergic to losartan.  MEDICATIONS:  Current Outpatient Medications  Medication Sig Dispense Refill   acetaminophen (TYLENOL) 325 MG tablet Take 650 mg by mouth every 6 (six) hours as needed for moderate pain.      celecoxib (CELEBREX) 100 MG capsule Take 100 mg by mouth 2 (two) times daily.     cetirizine (ZYRTEC) 10 MG tablet Take 20 mg by mouth 2 (two) times daily as needed.     clobetasol ointment (TEMOVATE) 0.34 % Apply 1 application topically once a week.  0   fluticasone (VERAMYST) 27.5 MCG/SPRAY nasal spray Place into the nose.     gabapentin (NEURONTIN) 300 MG capsule Take 300 mg by mouth 2 (two) times daily.     LUTEIN-ZEAXANTHIN PO Take 1 tablet by mouth daily.      Magnesium 250 MG TABS Take 1 tablet by mouth 2 (two) times daily.      metFORMIN (GLUCOPHAGE) 500 MG tablet TAKE 1 TABLET(500 MG) BY MOUTH TWICE DAILY     montelukast (SINGULAIR) 10 MG tablet TAKE 1 TABLET(10 MG) BY MOUTH EVERY NIGHT     Multiple Vitamins-Minerals (MULTIVITAMIN WITH MINERALS) tablet Take 1 tablet by mouth daily.     Omega-3 Fatty Acids (FISH OIL PO) Take by mouth.     pravastatin (PRAVACHOL) 10 MG tablet Take by mouth.     telmisartan-hydrochlorothiazide (MICARDIS HCT) 80-25 MG tablet Take 0.5 tablets by mouth daily.     omeprazole (PRILOSEC) 20 MG capsule Take 20 mg by mouth 3 (three) times a week.  (Patient not taking: Reported on 11/27/2020)     No current facility-administered medications for this visit.   Facility-Administered Medications Ordered in Other Visits  Medication Dose Route Frequency Provider Last Rate  Last Admin   heparin lock flush 100 unit/mL  500 Units Intravenous Once Choksi, Delorise Shiner, MD       sodium chloride 0.9 % injection 10 mL  10 mL Intravenous PRN Choksi, Janak, MD       sodium chloride 0.9 % injection 10 mL  10 mL Intravenous PRN Forest Gleason, MD   10 mL at 10/26/14 0940    PHYSICAL EXAMINATION: ECOG PERFORMANCE STATUS: 0 - Asymptomatic  BP (!) 154/85   Pulse 82   Temp (!) 97.5 F (36.4 C) (Tympanic)   Resp 20   Ht 5' (1.524 m)   Wt 248 lb (112.5 kg)   BMI 48.43 kg/m   Filed Weights   11/27/20 1010  Weight: 248 lb (112.5 kg)    Physical Exam HENT:     Head: Normocephalic and atraumatic.     Mouth/Throat:     Pharynx: No oropharyngeal exudate.  Eyes:     Pupils: Pupils are equal, round,  and reactive to light.  Cardiovascular:     Rate and Rhythm: Normal rate and regular rhythm.  Pulmonary:     Effort: Pulmonary effort is normal. No respiratory distress.     Breath sounds: Normal breath sounds. No wheezing.  Abdominal:     General: Bowel sounds are normal. There is no distension.     Palpations: Abdomen is soft. There is no mass.     Tenderness: There is no abdominal tenderness. There is no guarding or rebound.  Musculoskeletal:        General: No tenderness. Normal range of motion.     Cervical back: Normal range of motion and neck supple.  Skin:    General: Skin is warm.     Comments: Right and left BREAST exam [in the presence of nurse]- no unusual skin changes or dominant masses felt. Surgical scars noted.    Neurological:     Mental Status: She is alert and oriented to person, place, and time.  Psychiatric:        Mood and Affect: Affect normal.   LABORATORY DATA:  I have reviewed the data as listed    Component Value Date/Time   NA 136 11/27/2020 0940   NA 138 04/06/2014 1328   K 3.9 11/27/2020 0940   K 4.1 04/06/2014 1328   CL 101 11/27/2020 0940   CL 101 04/06/2014 1328   CO2 27 11/27/2020 0940   CO2 30 04/06/2014 1328   GLUCOSE 114  (H) 11/27/2020 0940   GLUCOSE 105 (H) 04/06/2014 1328   BUN 16 11/27/2020 0940   BUN 8 04/06/2014 1328   CREATININE 0.54 11/27/2020 0940   CREATININE 0.62 04/06/2014 1328   CALCIUM 9.1 11/27/2020 0940   CALCIUM 8.5 04/06/2014 1328   PROT 7.1 11/27/2020 0940   PROT 6.7 04/06/2014 1328   ALBUMIN 4.1 11/27/2020 0940   ALBUMIN 3.3 (L) 04/06/2014 1328   AST 20 11/27/2020 0940   AST 18 04/06/2014 1328   ALT 17 11/27/2020 0940   ALT 32 04/06/2014 1328   ALKPHOS 113 11/27/2020 0940   ALKPHOS 118 (H) 04/06/2014 1328   BILITOT 0.7 11/27/2020 0940   BILITOT 0.4 04/06/2014 1328   GFRNONAA >60 11/27/2020 0940   GFRNONAA >60 04/06/2014 1328   GFRNONAA >60 12/01/2013 0926   GFRAA >60 11/19/2019 0842   GFRAA >60 04/06/2014 1328   GFRAA >60 12/01/2013 0926    No results found for: SPEP, UPEP  Lab Results  Component Value Date   WBC 10.0 11/27/2020   NEUTROABS 6.1 11/27/2020   HGB 12.9 11/27/2020   HCT 38.3 11/27/2020   MCV 90.5 11/27/2020   PLT 338 11/27/2020      Chemistry      Component Value Date/Time   NA 136 11/27/2020 0940   NA 138 04/06/2014 1328   K 3.9 11/27/2020 0940   K 4.1 04/06/2014 1328   CL 101 11/27/2020 0940   CL 101 04/06/2014 1328   CO2 27 11/27/2020 0940   CO2 30 04/06/2014 1328   BUN 16 11/27/2020 0940   BUN 8 04/06/2014 1328   CREATININE 0.54 11/27/2020 0940   CREATININE 0.62 04/06/2014 1328      Component Value Date/Time   CALCIUM 9.1 11/27/2020 0940   CALCIUM 8.5 04/06/2014 1328   ALKPHOS 113 11/27/2020 0940   ALKPHOS 118 (H) 04/06/2014 1328   AST 20 11/27/2020 0940   AST 18 04/06/2014 1328   ALT 17 11/27/2020 0940   ALT 32 04/06/2014 1328  BILITOT 0.7 11/27/2020 0940   BILITOT 0.4 04/06/2014 1328       RADIOGRAPHIC STUDIES: I have personally reviewed the radiological images as listed and agreed with the findings in the report. No results found.   ASSESSMENT & PLAN:  No problem-specific Assessment & Plan notes found for this  encounter.   Orders Placed This Encounter  Procedures   DG Bone Density    Standing Status:   Future    Standing Expiration Date:   05/28/2022    Order Specific Question:   Reason for Exam (SYMPTOM  OR DIAGNOSIS REQUIRED)    Answer:   breast cancer; aromatase inhibitor use    Order Specific Question:   Preferred imaging location?    Answer:   [facility]   MM 3D SCREEN BREAST BILATERAL    Standing Status:   Future    Standing Expiration Date:   05/28/2022    Order Specific Question:   Reason for Exam (SYMPTOM  OR DIAGNOSIS REQUIRED)    Answer:   history of breast cancer    Order Specific Question:   Preferred imaging location?    Answer:   [facility]   CBC with Differential    Standing Status:   Future    Standing Expiration Date:   05/28/2022   Comprehensive metabolic panel    Standing Status:   Future    Standing Expiration Date:   05/28/2022   Cancer antigen 27.29    Standing Status:   Future    Standing Expiration Date:   05/28/2022    All questions were answered. The patient knows to call the clinic with any problems, questions or concerns.      Cammie Sickle, MD 11/27/2020 11:06 AM

## 2020-11-27 NOTE — Assessment & Plan Note (Signed)
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 Clinically no concern of recurrence- STABLE mammograms-August 2022.  Ordered mammogram August 2023.  Discussed plan annual visits- in agreement.  # Bone mineral density -Aug 2021- T score-.0.4- Bone density/osteoporosis surveillance discussed.  Continue calcium plus vitamin D. Will order today/1 year  # Weight gain- Discussed importance of healthy weight/and weight loss.  S/p Keto diet [lost 30 pounds 2020]; Strongly recommend eating more green leafy vegetables and cutting down processed food/ carbohydrates.  Instead increasing whole grains / protein in the diet.  Multiple studies have shown that optimal weight would help improve cardiovascular risk; also shown to cut on the risk of malignancies-colon cancer, breast cancer ovarian/uterine cancer in women and also prostate cancer in men. Wants to try keto again.   # DISPOSITION: # Mammogram bil- AUG 2023/DEXA scan # Follow-up in 12 months- MD;labs- CBC CMP/Ca-27-29- Dr.B

## 2020-11-28 ENCOUNTER — Encounter: Payer: Self-pay | Admitting: Internal Medicine

## 2020-11-28 LAB — CANCER ANTIGEN 27.29: CA 27.29: 16.6 U/mL (ref 0.0–38.6)

## 2021-05-02 DIAGNOSIS — C50911 Malignant neoplasm of unspecified site of right female breast: Secondary | ICD-10-CM | POA: Diagnosis not present

## 2021-05-02 DIAGNOSIS — Z171 Estrogen receptor negative status [ER-]: Secondary | ICD-10-CM | POA: Diagnosis not present

## 2021-05-02 DIAGNOSIS — Z6841 Body Mass Index (BMI) 40.0 and over, adult: Secondary | ICD-10-CM | POA: Diagnosis not present

## 2021-05-02 DIAGNOSIS — E1165 Type 2 diabetes mellitus with hyperglycemia: Secondary | ICD-10-CM | POA: Diagnosis not present

## 2021-05-02 DIAGNOSIS — I10 Essential (primary) hypertension: Secondary | ICD-10-CM | POA: Diagnosis not present

## 2021-05-02 DIAGNOSIS — Z0000 Encounter for general adult medical examination without abnormal findings: Secondary | ICD-10-CM | POA: Diagnosis not present

## 2021-05-02 DIAGNOSIS — M170 Bilateral primary osteoarthritis of knee: Secondary | ICD-10-CM | POA: Diagnosis not present

## 2021-05-09 DIAGNOSIS — C50011 Malignant neoplasm of nipple and areola, right female breast: Secondary | ICD-10-CM | POA: Diagnosis not present

## 2021-05-09 DIAGNOSIS — I10 Essential (primary) hypertension: Secondary | ICD-10-CM | POA: Diagnosis not present

## 2021-05-09 DIAGNOSIS — E1165 Type 2 diabetes mellitus with hyperglycemia: Secondary | ICD-10-CM | POA: Diagnosis not present

## 2021-05-29 DIAGNOSIS — N61 Mastitis without abscess: Secondary | ICD-10-CM | POA: Diagnosis not present

## 2021-06-27 DIAGNOSIS — Z01419 Encounter for gynecological examination (general) (routine) without abnormal findings: Secondary | ICD-10-CM | POA: Diagnosis not present

## 2021-06-27 DIAGNOSIS — Z1231 Encounter for screening mammogram for malignant neoplasm of breast: Secondary | ICD-10-CM | POA: Diagnosis not present

## 2021-06-27 DIAGNOSIS — Z1331 Encounter for screening for depression: Secondary | ICD-10-CM | POA: Diagnosis not present

## 2021-06-28 DIAGNOSIS — N63 Unspecified lump in unspecified breast: Secondary | ICD-10-CM | POA: Diagnosis not present

## 2021-06-29 ENCOUNTER — Other Ambulatory Visit: Payer: Self-pay | Admitting: General Surgery

## 2021-06-29 ENCOUNTER — Encounter
Admission: RE | Admit: 2021-06-29 | Discharge: 2021-06-29 | Disposition: A | Discharge status: Home / Self Care | Payer: 59 | Source: Ambulatory Visit | Attending: General Surgery | Admitting: General Surgery

## 2021-06-29 ENCOUNTER — Other Ambulatory Visit: Payer: Self-pay

## 2021-06-29 NOTE — Patient Instructions (Addendum)
Your procedure is scheduled on: Wednesday 07/04/21 ?Report to the Registration Desk on the [facility]st floor of [facility]. ?To find out your arrival time, please call [phone] between 1PM - 3PM on: Tuesday 07/03/21 ? ?REMEMBER: ?Instructions that are not followed completely may result in serious medical risk, up to and including death; or upon the discretion of your surgeon and anesthesiologist your surgery may need to be rescheduled. ? ?Do not eat food after midnight the night before surgery.  ?No gum chewing, lozengers or hard candies. ? ?You may however, drink water liquids up to 2 hours before you are scheduled to arrive for your surgery. Do not drink anything within 2 hours of your scheduled arrival time. ? ?Type 1 and Type 2 diabetics should only drink water. ? ?TAKE THESE MEDICATIONS THE MORNING OF SURGERY WITH A SIP OF WATER: ?gabapentin (NEURONTIN) 300 MG capsule ? ? ?Hold Metformin for 2 days. Last dose on Sunday 07/01/21. ? ?One week prior to surgery: ?Stop Anti-inflammatories (NSAIDS) such as celecoxib (CELEBREX) 100 MG capsule, Advil, Aleve, Ibuprofen, Motrin, Naproxen, Naprosyn and Aspirin based products such as Excedrin, Goodys Powder, BC Powder. ? ?Stop taking your LUTEIN-ZEAXANTHIN PO, Magnesium 250 MG TABS, Multiple Vitamins-Minerals (MULTIVITAMIN WITH MINERALS) tablet, Omega-3 Fatty Acids (FISH OIL PO) and ANY OVER THE COUNTER supplements until after surgery. ? ?You may however, continue to take Tylenol if needed for pain up until the day of surgery. ? ?No Alcohol for 24 hours before or after surgery. ? ?No Smoking including e-cigarettes for 24 hours prior to surgery.  ?No chewable tobacco products for at least 6 hours prior to surgery.  ?No nicotine patches on the day of surgery. ? ?Do not use any "recreational" drugs for at least a week prior to your surgery.  ?Please be advised that the combination of cocaine and anesthesia may have negative outcomes, up to and including death. ?If you test  positive for cocaine, your surgery will be cancelled. ? ?On the morning of surgery brush your teeth with toothpaste and water, you may rinse your mouth with mouthwash if you wish. ?Do not swallow any toothpaste or mouthwash. ? ?Use Dial Soap the night prior to surgery and then put on clean pajamas and a clean sheet on your bed. The morning of the surgery also shower with the Dial soap. ? ?Do not wear jewelry, make-up, hairpins, clips or nail polish. ? ?Do not wear lotions, powders, or perfumes.  ? ?Do not shave body from the neck down 48 hours prior to surgery just in case you cut yourself which could leave a site for infection.  ? ?Do not bring valuables to the hospital. [facility] is not responsible for any missing/lost belongings or valuables.  ?Notify your doctor if there is any change in your medical condition (cold, fever, infection). ? ?Wear comfortable clothing (specific to your surgery type) to the hospital. ? ?If you are being discharged the day of surgery, you will not be allowed to drive home. ?You will need a responsible adult (18 years or older) to drive you home and stay with you that night.  ? ?If you are taking public transportation, you will need to have a responsible adult (18 years or older) with you. ?Please confirm with your physician that it is acceptable to use public transportation.  ? ?Please call the [facility] at [phone] if you have any questions about these instructions. ? ?Surgery Visitation Policy: ? ?Patients undergoing a surgery or procedure may have  two family members or support persons with them as long as the person is not COVID-19 positive or experiencing its symptoms.  ? ?Inpatient Visitation:   ? ?Visiting hours are 7 a.m. to 8 p.m. ?Up to four visitors are allowed at one time in a patient room, including children. The visitors may rotate out with other people during the day. One designated support person (adult) may remain overnight.  ?

## 2021-06-29 NOTE — Progress Notes (Signed)
Subjective:  ? ?Patient ID: Felicia Kidd is a 61 y.o. female. ? ?HPI ? ?The following portions of the patient's history were reviewed and updated as appropriate. ? ?This an established patient is here today for: office visit. The patient has a right lumpectomy in 2015. Today, she complains of right breast drainage from an open area near her incision from previous breast surgery. Patient reports it started around 04-29-21. She denies any fever or chills. Patient states her last mammogram was in September and was normal.  ? ?Chief Complaint  ?Patient presents with  ? Breast Problem  ? ? ?BP (!) 152/68  Pulse 103  Temp 36.9 ?C (98.4 ?F)  Ht 154.9 cm (_0 )  Wt (!) 110.7 kg (244 lb)  SpO2 98%  BMI 46.10 kg/m?  ? ?Past Medical History:  ?Diagnosis Date  ? Allergic rhinitis  ? Arthritis  ? Asthma  ? Breast cancer (CMS-HCC)  ?right  ? Diabetes mellitus type 2, uncomplicated (CMS-HCC)  ? GERD (gastroesophageal reflux disease)  ? History of chemotherapy 10/2013  ? Hx of radiation therapy 04/2014  ? Hyperlipidemia  ? Hypertension  ? Leukocytosis  ?followed by Hematology  ? Thyroid disease  ? ? ?Past Surgical History:  ?Procedure Laterality Date  ? MASTECTOMY PARTIAL / LUMPECTOMY Right 09/27/2013  ?Dr Bary Castilla  ? INSERTION CENTRAL VENOUS ACCESS DEVICE W/ SUBCUTANEOUS PORT 11/02/2013  ? COLONOSCOPY 02/23/2015  ?Int Hemorrhoids, Diverticulosis: CBF 02/2025  ? DILATION AND CURETTAGE, DIAGNOSTIC / THERAPEUTIC  ? ? ?OB History  ?Gravida  ?1  ?Para  ?1  ?Term  ?1  ?Preterm  ? ?AB  ? ?Living  ?1  ? ?SAB  ? ?IAB  ? ?Ectopic  ? ?Molar  ? ?Multiple  ? ?Live Births  ?1  ? ? ?Obstetric Comments  ?Age at first period 45 ?Age of first pregnancy 77 ? ? ? ? ?Social History  ? ?Socioeconomic History  ? Marital status: Widowed  ?Tobacco Use  ? Smoking status: Never  ? Smokeless tobacco: Never  ?Vaping Use  ? Vaping Use: Never used  ?Substance and Sexual Activity  ? Alcohol use: Yes  ?Alcohol/week: 0.0 standard drinks  ?Comment: once a  year  ? Drug use: Never  ? Sexual activity: Not Currently  ?Partners: Male  ?Birth control/protection: Post-menopausal  ?Comment: Widow  ? ? ?Allergies  ?Allergen Reactions  ? Cozaar [Losartan] Syncope  ? Lipitor [Atorvastatin] Muscle Pain  ? Other Swelling, Rash and Other (See Comments)  ?PNEUMOVAX. ? ? ?Current Outpatient Medications  ?Medication Sig Dispense Refill  ? acetaminophen (TYLENOL ORAL) Take 1,000 mg by mouth once daily as needed  ? blood glucose diagnostic (CONTOUR NEXT TEST STRIPS) test strip 1 each (1 strip total) 3 (three) times daily Use as instructed. 100 each 3  ? celecoxib (CELEBREX) 100 MG capsule Take 1 capsule (100 mg total) by mouth 2 (two) times daily for 180 days 180 capsule 1  ? CONTOUR NEXT TEST STRIPS test strip USE TO TEST BLOOD SUGAR ONCE DAILY AS DIRECTED 100 each 3  ? gabapentin (NEURONTIN) 300 MG capsule Take 1 capsule (300 mg total) by mouth 2 (two) times daily for 180 days 180 capsule 1  ? lancing device with lancets kit Use 1 each once daily 100 each 3  ? loratadine (CLARITIN ORAL) Take 1 tablet by mouth as needed  ? MAGNESIUM CITRATE ORAL Take by mouth once daily  ? metFORMIN (GLUCOPHAGE) 500 MG tablet Take 1 tablet (500 mg total) by mouth 2 (  two) times daily with meals 180 tablet 1  ? montelukast (SINGULAIR) 10 mg tablet TAKE 1 TABLET BY MOUTH EVERY NIGHT 30 tablet 2  ? multivitamin tablet Take 1 tablet by mouth once daily.  ? omega-3 fatty acids-vitamin E 1,000 mg Take by mouth once daily.  ? pravastatin (PRAVACHOL) 10 MG tablet Take 1 tablet (10 mg total) by mouth at bedtime for 180 days Takes this M, W, F. 90 tablet 1  ? telmisartan-hydrochlorothiazide (MICARDIS HCT) 40-12.5 mg tablet Take 1 tablet by mouth once daily 90 tablet 1  ? vit A/vit C/vit E/zinc/copper (ICAPS AREDS ORAL) Take by mouth  ? ?No current facility-administered medications for this visit.  ? ?Family History  ?Problem Relation Age of Onset  ? Diabetes Mother  ? Kidney failure Mother  ? Diabetes type II  Mother  ? High blood pressure (Hypertension) Mother  ? Stroke Mother  ? Dementia Mother  ? Osteoporosis (Thinning of bones) Mother  ? Arthritis Mother  ? Stroke Father  ? Osteoporosis (Thinning of bones) Sister  ? Osteoporosis (Thinning of bones) Brother  ? Breast cancer Neg Hx  ? Ovarian cancer Neg Hx  ? Colon cancer Neg Hx  ? ?Labs and Radiology:  ? ?November 08, 2020 bilateral screening mammogram review: ? ?Dense calcifications in the area of wide excision of the right breast. Increased calcifications from the 2020 exam. These films were independently reviewed. ? ?May 29, 2021 right breast ultrasound: ? ?There is an area of dense calcification in the right breast at the 6 o'clock position measuring at least 1.75 cm in diameter. The calcifications extend to the skin level of the site of present drainage. ? ?Review of Systems  ?Constitutional: Negative for chills and fever.  ?Respiratory: Negative for cough.  ? ? ?Objective:  ?Physical Exam ?Exam conducted with a chaperone present.  ?Constitutional:  ?Appearance: Normal appearance.  ?Cardiovascular:  ?Rate and Rhythm: Normal rate and regular rhythm.  ?Pulses: Normal pulses.  ?Heart sounds: Normal heart sounds.  ?Pulmonary:  ?Effort: Pulmonary effort is normal.  ?Breath sounds: Normal breath sounds.  ?Chest:  ? ?Comments: Focal thickening in the midportion of the wide excision site 6:00 right breast significantly increased in size, clinically 3 cm. ?Musculoskeletal:  ?Cervical back: Neck supple.  ?Lymphadenopathy:  ?Upper Body:  ?Right upper body: No supraclavicular or axillary adenopathy.  ?Left upper body: No supraclavicular or axillary adenopathy.  ?Skin: ?General: Skin is warm and dry.  ?Neurological:  ?Mental Status: She is alert and oriented to person, place, and time.  ?Psychiatric:  ?Mood and Affect: Mood normal.  ?Behavior: Behavior normal.  ? ? ?Assessment:  ? ?Likely skin necrosis secondary to compression between the patient's undergarments and the  underlying dense calcified mass in the site of radiation. ? ?Plan:  ? ?I suspect that this will require surgical resection of the calcified tissue and closure, but its not inappropriate to consider a trial of oral antibiotics and time. The patient will be prescribed doxycycline 100 mg p.o. twice daily for 3-week course. We will plan for follow-up in 1 month. She has been asked to use A&E ointment around the moisture damage skin around the drainage site and to pad the area as possible to minimize compression from her bra. ? ?This note is partially prepared by Ledell Noss, CMA acting as a scribe in the presence of Dr. Hervey Ard, MD.  ? ?The documentation recorded by the scribe accurately reflects the service I personally performed and the decisions made by me.  ? ?  Robert Bellow, MD FACS ?

## 2021-07-02 ENCOUNTER — Other Ambulatory Visit
Admission: RE | Admit: 2021-07-02 | Discharge: 2021-07-02 | Disposition: A | Discharge status: Home / Self Care | Payer: 59 | Source: Ambulatory Visit | Attending: General Surgery | Admitting: General Surgery

## 2021-07-02 DIAGNOSIS — Z01810 Encounter for preprocedural cardiovascular examination: Secondary | ICD-10-CM | POA: Insufficient documentation

## 2021-07-03 MED ORDER — SODIUM CHLORIDE 0.9 % IV SOLN
INTRAVENOUS | Status: DC
Start: 2021-07-03 — End: 2021-07-04

## 2021-07-03 MED ORDER — CHLORHEXIDINE GLUCONATE 0.12 % MT SOLN: 15.0000 mL | Freq: Once | OROMUCOSAL | Status: AC

## 2021-07-03 MED ORDER — FAMOTIDINE 20 MG PO TABS: 20.0000 mg | ORAL_TABLET | Freq: Once | ORAL | Status: AC

## 2021-07-03 MED ORDER — CEFAZOLIN SODIUM-DEXTROSE 2-4 GM/100ML-% IV SOLN
2.0000 g | INTRAVENOUS | Status: AC
  Administered 2021-07-04: 2 g via INTRAVENOUS

## 2021-07-03 MED ORDER — ORAL CARE MOUTH RINSE: 15.0000 mL | Freq: Once | OROMUCOSAL | Status: AC

## 2021-07-03 MED ORDER — CHLORHEXIDINE GLUCONATE CLOTH 2 % EX PADS: 6.0000 | MEDICATED_PAD | Freq: Once | CUTANEOUS | Status: DC

## 2021-07-04 ENCOUNTER — Other Ambulatory Visit: Payer: Self-pay

## 2021-07-04 ENCOUNTER — Encounter
Admission: RE | Disposition: A | Discharge status: Home / Self Care | Payer: Self-pay | Source: Ambulatory Visit | Attending: General Surgery

## 2021-07-04 ENCOUNTER — Encounter: Payer: Self-pay | Admitting: General Surgery

## 2021-07-04 ENCOUNTER — Ambulatory Visit
Admission: RE | Admit: 2021-07-04 | Discharge: 2021-07-04 | Disposition: A | Discharge status: Home / Self Care | Payer: 59 | Source: Ambulatory Visit | Attending: General Surgery | Admitting: General Surgery

## 2021-07-04 ENCOUNTER — Ambulatory Visit: Payer: 59 | Admitting: Anesthesiology

## 2021-07-04 DIAGNOSIS — Z7984 Long term (current) use of oral hypoglycemic drugs: Secondary | ICD-10-CM | POA: Insufficient documentation

## 2021-07-04 DIAGNOSIS — E119 Type 2 diabetes mellitus without complications: Secondary | ICD-10-CM | POA: Diagnosis not present

## 2021-07-04 DIAGNOSIS — D4861 Neoplasm of uncertain behavior of right breast: Secondary | ICD-10-CM | POA: Diagnosis not present

## 2021-07-04 DIAGNOSIS — N61 Mastitis without abscess: Secondary | ICD-10-CM | POA: Diagnosis not present

## 2021-07-04 DIAGNOSIS — Z6841 Body Mass Index (BMI) 40.0 and over, adult: Secondary | ICD-10-CM | POA: Insufficient documentation

## 2021-07-04 DIAGNOSIS — N6489 Other specified disorders of breast: Secondary | ICD-10-CM | POA: Insufficient documentation

## 2021-07-04 DIAGNOSIS — I10 Essential (primary) hypertension: Secondary | ICD-10-CM | POA: Insufficient documentation

## 2021-07-04 DIAGNOSIS — E785 Hyperlipidemia, unspecified: Secondary | ICD-10-CM | POA: Diagnosis not present

## 2021-07-04 DIAGNOSIS — Z923 Personal history of irradiation: Secondary | ICD-10-CM | POA: Diagnosis not present

## 2021-07-04 DIAGNOSIS — N641 Fat necrosis of breast: Secondary | ICD-10-CM | POA: Insufficient documentation

## 2021-07-04 DIAGNOSIS — L988 Other specified disorders of the skin and subcutaneous tissue: Secondary | ICD-10-CM | POA: Diagnosis not present

## 2021-07-04 DIAGNOSIS — Z853 Personal history of malignant neoplasm of breast: Secondary | ICD-10-CM | POA: Insufficient documentation

## 2021-07-04 DIAGNOSIS — R928 Other abnormal and inconclusive findings on diagnostic imaging of breast: Secondary | ICD-10-CM | POA: Diagnosis not present

## 2021-07-04 DIAGNOSIS — N6031 Fibrosclerosis of right breast: Secondary | ICD-10-CM | POA: Diagnosis not present

## 2021-07-04 DIAGNOSIS — N6310 Unspecified lump in the right breast, unspecified quadrant: Secondary | ICD-10-CM | POA: Diagnosis not present

## 2021-07-04 HISTORY — PX: BREAST LUMPECTOMY: SHX2

## 2021-07-04 HISTORY — PX: BREAST EXCISIONAL BIOPSY: SUR124

## 2021-07-04 LAB — GLUCOSE, CAPILLARY
Glucose-Capillary: 129 mg/dL — ABNORMAL HIGH (ref 70–99)
Glucose-Capillary: 135 mg/dL — ABNORMAL HIGH (ref 70–99)

## 2021-07-04 SURGERY — BREAST LUMPECTOMY
Anesthesia: General | Laterality: Right

## 2021-07-04 MED ORDER — FENTANYL CITRATE (PF) 100 MCG/2ML IJ SOLN
INTRAMUSCULAR | Status: DC | PRN
  Administered 2021-07-04 (×2): 50 ug via INTRAVENOUS

## 2021-07-04 MED ORDER — FENTANYL CITRATE (PF) 100 MCG/2ML IJ SOLN
INTRAMUSCULAR | Status: AC
  Filled 2021-07-04: qty 2

## 2021-07-04 MED ORDER — PROPOFOL 10 MG/ML IV BOLUS
INTRAVENOUS | Status: AC
  Filled 2021-07-04: qty 20

## 2021-07-04 MED ORDER — OXYCODONE HCL 5 MG/5ML PO SOLN: 5.0000 mg | Freq: Once | ORAL | Status: AC | PRN

## 2021-07-04 MED ORDER — MIDAZOLAM HCL 2 MG/2ML IJ SOLN
INTRAMUSCULAR | Status: AC
  Filled 2021-07-04: qty 2

## 2021-07-04 MED ORDER — KETOROLAC TROMETHAMINE 30 MG/ML IJ SOLN
INTRAMUSCULAR | Status: DC | PRN
  Administered 2021-07-04: 30 mg via INTRAVENOUS

## 2021-07-04 MED ORDER — MIDAZOLAM HCL 2 MG/2ML IJ SOLN
INTRAMUSCULAR | Status: DC | PRN
  Administered 2021-07-04: 2 mg via INTRAVENOUS

## 2021-07-04 MED ORDER — FENTANYL CITRATE (PF) 100 MCG/2ML IJ SOLN
INTRAMUSCULAR | Status: AC
  Administered 2021-07-04: 50 ug via INTRAVENOUS
  Filled 2021-07-04: qty 2

## 2021-07-04 MED ORDER — ACETAMINOPHEN 10 MG/ML IV SOLN: 1000.0000 mg | Freq: Once | INTRAVENOUS | Status: DC | PRN

## 2021-07-04 MED ORDER — HYDROCODONE-ACETAMINOPHEN 5-325 MG PO TABS: 1.0000 | ORAL_TABLET | ORAL | 0 refills | Status: AC | PRN

## 2021-07-04 MED ORDER — ACETAMINOPHEN 10 MG/ML IV SOLN
INTRAVENOUS | Status: AC
  Filled 2021-07-04: qty 100

## 2021-07-04 MED ORDER — KETOROLAC TROMETHAMINE 30 MG/ML IJ SOLN
INTRAMUSCULAR | Status: AC
  Filled 2021-07-04: qty 1

## 2021-07-04 MED ORDER — FENTANYL CITRATE (PF) 100 MCG/2ML IJ SOLN
25.0000 ug | INTRAMUSCULAR | Status: DC | PRN
  Administered 2021-07-04: 50 ug via INTRAVENOUS

## 2021-07-04 MED ORDER — CHLORHEXIDINE GLUCONATE 0.12 % MT SOLN
OROMUCOSAL | Status: AC
  Administered 2021-07-04: 15 mL via OROMUCOSAL
  Filled 2021-07-04: qty 15

## 2021-07-04 MED ORDER — DEXAMETHASONE SODIUM PHOSPHATE 10 MG/ML IJ SOLN
INTRAMUSCULAR | Status: AC
Start: 2021-07-04 — End: ?
  Filled 2021-07-04: qty 1

## 2021-07-04 MED ORDER — ONDANSETRON HCL 4 MG/2ML IJ SOLN
INTRAMUSCULAR | Status: AC
  Filled 2021-07-04: qty 2

## 2021-07-04 MED ORDER — CEFAZOLIN SODIUM-DEXTROSE 2-4 GM/100ML-% IV SOLN
INTRAVENOUS | Status: AC
  Filled 2021-07-04: qty 100

## 2021-07-04 MED ORDER — LACTATED RINGERS IV SOLN: INTRAVENOUS | Status: DC

## 2021-07-04 MED ORDER — OXYCODONE HCL 5 MG PO TABS
ORAL_TABLET | ORAL | Status: AC
  Administered 2021-07-04: 5 mg via ORAL
  Filled 2021-07-04: qty 1

## 2021-07-04 MED ORDER — ACETAMINOPHEN 10 MG/ML IV SOLN
INTRAVENOUS | Status: DC | PRN
  Administered 2021-07-04: 1000 mg via INTRAVENOUS

## 2021-07-04 MED ORDER — PROPOFOL 10 MG/ML IV BOLUS
INTRAVENOUS | Status: DC | PRN
  Administered 2021-07-04: 170 mg via INTRAVENOUS
  Administered 2021-07-04: 30 mg via INTRAVENOUS

## 2021-07-04 MED ORDER — FAMOTIDINE 20 MG PO TABS
ORAL_TABLET | ORAL | Status: AC
  Administered 2021-07-04: 20 mg via ORAL
  Filled 2021-07-04: qty 1

## 2021-07-04 MED ORDER — BUPIVACAINE-EPINEPHRINE (PF) 0.5% -1:200000 IJ SOLN
INTRAMUSCULAR | Status: AC
  Filled 2021-07-04: qty 30

## 2021-07-04 MED ORDER — EPHEDRINE SULFATE (PRESSORS) 50 MG/ML IJ SOLN
INTRAMUSCULAR | Status: DC | PRN
  Administered 2021-07-04: 10 mg via INTRAVENOUS
  Administered 2021-07-04: 5 mg via INTRAVENOUS

## 2021-07-04 MED ORDER — ONDANSETRON HCL 4 MG/2ML IJ SOLN: 4.0000 mg | Freq: Once | INTRAMUSCULAR | Status: DC | PRN

## 2021-07-04 MED ORDER — BUPIVACAINE-EPINEPHRINE (PF) 0.5% -1:200000 IJ SOLN
INTRAMUSCULAR | Status: DC | PRN
  Administered 2021-07-04: 30 mL

## 2021-07-04 MED ORDER — EPHEDRINE 5 MG/ML INJ
INTRAVENOUS | Status: AC
  Filled 2021-07-04: qty 5

## 2021-07-04 MED ORDER — OXYCODONE HCL 5 MG PO TABS: 5.0000 mg | ORAL_TABLET | Freq: Once | ORAL | Status: AC | PRN

## 2021-07-04 MED ORDER — DEXAMETHASONE SODIUM PHOSPHATE 10 MG/ML IJ SOLN
INTRAMUSCULAR | Status: DC | PRN
  Administered 2021-07-04: 5 mg via INTRAVENOUS

## 2021-07-04 MED ORDER — LIDOCAINE HCL (CARDIAC) PF 100 MG/5ML IV SOSY
PREFILLED_SYRINGE | INTRAVENOUS | Status: DC | PRN
  Administered 2021-07-04: 50 mg via INTRAVENOUS

## 2021-07-04 MED ORDER — ONDANSETRON HCL 4 MG/2ML IJ SOLN
INTRAMUSCULAR | Status: DC | PRN
  Administered 2021-07-04: 4 mg via INTRAVENOUS

## 2021-07-04 SURGICAL SUPPLY — 53 items
BINDER BREAST XLRG (GAUZE/BANDAGES/DRESSINGS) ×1 IMPLANT
BLADE PHOTON ILLUMINATED (MISCELLANEOUS) IMPLANT
BLADE SURG 15 STRL SS SAFETY (BLADE) ×4 IMPLANT
BULB RESERV EVAC DRAIN JP 100C (MISCELLANEOUS) ×1 IMPLANT
CHLORAPREP W/TINT 26 (MISCELLANEOUS) ×2 IMPLANT
CNTNR SPEC 2.5X3XGRAD LEK (MISCELLANEOUS)
CONT SPEC 4OZ STER OR WHT (MISCELLANEOUS)
CONTAINER SPEC 2.5X3XGRAD LEK (MISCELLANEOUS) IMPLANT
COVER PROBE FLX POLY STRL (MISCELLANEOUS) ×2 IMPLANT
DEVICE DUBIN SPECIMEN MAMMOGRA (MISCELLANEOUS) ×2 IMPLANT
DRAIN CHANNEL JP 15F RND 16 (MISCELLANEOUS) IMPLANT
DRAPE LAPAROTOMY TRNSV 106X77 (MISCELLANEOUS) ×2 IMPLANT
DRSG GAUZE FLUFF 36X18 (GAUZE/BANDAGES/DRESSINGS) ×4 IMPLANT
DRSG TELFA 3X8 NADH (GAUZE/BANDAGES/DRESSINGS) ×2 IMPLANT
ELECT CAUTERY BLADE TIP 2.5 (TIP) ×2
ELECT REM PT RETURN 9FT ADLT (ELECTROSURGICAL) ×2
ELECTRODE CAUTERY BLDE TIP 2.5 (TIP) ×1 IMPLANT
ELECTRODE REM PT RTRN 9FT ADLT (ELECTROSURGICAL) ×1 IMPLANT
GAUZE 4X4 16PLY ~~LOC~~+RFID DBL (SPONGE) ×2 IMPLANT
GLOVE SURG ENC MOIS LTX SZ7.5 (GLOVE) ×2 IMPLANT
GLOVE SURG UNDER LTX SZ8 (GLOVE) ×2 IMPLANT
GOWN STRL REUS W/ TWL LRG LVL3 (GOWN DISPOSABLE) ×2 IMPLANT
GOWN STRL REUS W/TWL LRG LVL3 (GOWN DISPOSABLE) ×2
KIT TURNOVER KIT A (KITS) ×2 IMPLANT
LABEL OR SOLS (LABEL) ×2 IMPLANT
MANIFOLD NEPTUNE II (INSTRUMENTS) ×2 IMPLANT
MARGIN MAP 10MM (MISCELLANEOUS) ×2 IMPLANT
NDL HYPO 25X1 1.5 SAFETY (NEEDLE) ×2 IMPLANT
NEEDLE HYPO 22GX1.5 SAFETY (NEEDLE) ×2 IMPLANT
NEEDLE HYPO 25X1 1.5 SAFETY (NEEDLE) ×4 IMPLANT
PACK BASIN MINOR ARMC (MISCELLANEOUS) ×2 IMPLANT
PAD DRESSING TELFA 3X8 NADH (GAUZE/BANDAGES/DRESSINGS) ×1 IMPLANT
PENCIL ELECTRO HAND CTR (MISCELLANEOUS) ×2 IMPLANT
SHEARS FOC LG CVD HARMONIC 17C (MISCELLANEOUS) IMPLANT
SHEARS HARMONIC 9CM CVD (BLADE) IMPLANT
STRIP CLOSURE SKIN 1/2X4 (GAUZE/BANDAGES/DRESSINGS) ×2 IMPLANT
SUT ETHILON 3-0 FS-10 30 BLK (SUTURE) ×2
SUT SILK 2 0 (SUTURE) ×1
SUT SILK 2-0 18XBRD TIE 12 (SUTURE) ×1 IMPLANT
SUT VIC AB 2-0 CT1 27 (SUTURE) ×5
SUT VIC AB 2-0 CT1 TAPERPNT 27 (SUTURE) ×2 IMPLANT
SUT VIC AB 3-0 54X BRD REEL (SUTURE) IMPLANT
SUT VIC AB 3-0 BRD 54 (SUTURE)
SUT VIC AB 4-0 FS2 27 (SUTURE) ×2 IMPLANT
SUTURE EHLN 3-0 FS-10 30 BLK (SUTURE) IMPLANT
SWABSTK COMLB BENZOIN TINCTURE (MISCELLANEOUS) ×2 IMPLANT
SYR 10ML LL (SYRINGE) ×2 IMPLANT
SYR BULB IRRIG 60ML STRL (SYRINGE) ×2 IMPLANT
SYR CONTROL 10ML LL (SYRINGE) ×2 IMPLANT
TAPE TRANSPORE STRL 2 31045 (GAUZE/BANDAGES/DRESSINGS) IMPLANT
TRAP NEPTUNE SPECIMEN COLLECT (MISCELLANEOUS) ×2 IMPLANT
WATER STERILE IRR 1000ML POUR (IV SOLUTION) ×2 IMPLANT
WATER STERILE IRR 500ML POUR (IV SOLUTION) ×2 IMPLANT

## 2021-07-04 NOTE — Anesthesia Postprocedure Evaluation (Signed)
Anesthesia Post Note ? ?Patient: Felicia Kidd ? ?Procedure(s) Performed: BREAST LUMPECTOMY (Right) ? ?Patient location during evaluation: PACU ?Anesthesia Type: General ?Level of consciousness: awake and alert, oriented and patient cooperative ?Pain management: pain level controlled ?Vital Signs Assessment: post-procedure vital signs reviewed and stable ?Respiratory status: spontaneous breathing, nonlabored ventilation and respiratory function stable ?Cardiovascular status: blood pressure returned to baseline and stable ?Postop Assessment: adequate PO intake ?Anesthetic complications: no ? ? ?No notable events documented. ? ? ?Last Vitals:  ?Vitals:  ? 07/04/21 1645 07/04/21 1655  ?BP: 97/78 (!) 148/77  ?Pulse: 74 75  ?Resp: 10 18  ?Temp: (!) 36.2 ?C (!) 36.2 ?C  ?SpO2: 96% 99%  ?  ?Last Pain:  ?Vitals:  ? 07/04/21 1655  ?TempSrc: Temporal  ?PainSc: 5   ? ? ?  ?  ?  ?  ?  ?  ? ?Darrin Nipper ? ? ? ? ?

## 2021-07-04 NOTE — Transfer of Care (Signed)
Immediate Anesthesia Transfer of Care Note ? ?Patient: Felicia Kidd ? ?Procedure(s) Performed: BREAST LUMPECTOMY (Right) ? ?Patient Location: PACU ? ?Anesthesia Type:General ? ?Level of Consciousness: drowsy and patient cooperative ? ?Airway & Oxygen Therapy: Patient Spontanous Breathing ? ?Post-op Assessment: Report given to RN and Post -op Vital signs reviewed and stable ? ?Post vital signs: Reviewed and stable ? ?Last Vitals:  ?Vitals Value Taken Time  ?BP 130/69 07/04/21 1617  ?Temp    ?Pulse 87 07/04/21 1618  ?Resp 20 07/04/21 1618  ?SpO2 97 % 07/04/21 1618  ?Vitals shown include unvalidated device data. ? ?Last Pain:  ?Vitals:  ? 07/04/21 1212  ?TempSrc: Temporal  ?PainSc: 0-No pain  ?   ? ?  ? ?Complications: No notable events documented. ?

## 2021-07-04 NOTE — Anesthesia Preprocedure Evaluation (Addendum)
Anesthesia Evaluation  ?Patient identified by MRN, date of birth, ID band ?Patient awake ? ? ? ?Reviewed: ?Allergy & Precautions, NPO status , Patient's Chart, lab work & pertinent test results ? ?History of Anesthesia Complications ?Negative for: history of anesthetic complications ? ?Airway ?Mallampati: III ? ? ?Neck ROM: Full ? ? ? Dental ? ?(+) Missing ?  ?Pulmonary ?neg pulmonary ROS,  ?  ?Pulmonary exam normal ?breath sounds clear to auscultation ? ? ? ? ? ? Cardiovascular ?hypertension, Normal cardiovascular exam ?Rhythm:Regular Rate:Normal ? ?ECG 07/02/21: normal ?  ?Neuro/Psych ?negative neurological ROS ?   ? GI/Hepatic ?PUD,   ?Endo/Other  ?diabetes, Type 2Class 3 obesity ? Renal/GU ?negative Renal ROS  ? ?  ?Musculoskeletal ? ? Abdominal ?  ?Peds ? Hematology ?Breast CA   ?Anesthesia Other Findings ? ? Reproductive/Obstetrics ? ?  ? ? ? ? ? ? ? ? ? ? ? ? ? ?  ?  ? ? ? ? ? ? ? ?Anesthesia Physical ?Anesthesia Plan ? ?ASA: 3 ? ?Anesthesia Plan: General  ? ?Post-op Pain Management:   ? ?Induction: Intravenous ? ?PONV Risk Score and Plan: 3 and Ondansetron, Dexamethasone and Treatment may vary due to age or medical condition ? ?Airway Management Planned: LMA ? ?Additional Equipment:  ? ?Intra-op Plan:  ? ?Post-operative Plan: Extubation in OR ? ?Informed Consent: I have reviewed the patients History and Physical, chart, labs and discussed the procedure including the risks, benefits and alternatives for the proposed anesthesia with the patient or authorized representative who has indicated his/her understanding and acceptance.  ? ? ? ?Dental advisory given ? ?Plan Discussed with: CRNA ? ?Anesthesia Plan Comments: (GETA backup.  Patient consented for risks of anesthesia including but not limited to:  ?- adverse reactions to medications ?- damage to eyes, teeth, lips or other oral mucosa ?- nerve damage due to positioning  ?- sore throat or hoarseness ?- damage to heart, brain,  nerves, lungs, other parts of body or loss of life ? ?Informed patient about role of CRNA in peri- and intra-operative care.  Patient voiced understanding.)  ? ? ? ? ? ?Anesthesia Quick Evaluation ? ?

## 2021-07-04 NOTE — Op Note (Addendum)
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 ? ?Postoperative diagnosis: Same. ? ?Operative procedure: Resection of chronic calcific breast mass of the right breast with tissue transfer closure.  Ultrasound guidance. ? ?Operating surgeon: Hervey Ard, MD. ? ?Anesthesia: General by LMA, Marcaine 0.5% with 1: 200,000 units of epinephrine, 30 cc. ? ?Estimated blood loss: 5 cc. ? ?Clinical note: This 61 year old woman is now 6 years post breast conservation surgery and radiation.  She is steadily enlarging mass of calcified tissue in the 6 o'clock position of the breast along the old scar.  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  Probing showed this to track towards the calcific mass.  She was felt to be a candidate for excision. ? ?The patient received Ancef prior to the procedure.  SCD stockings for DVT prevention. ? ?Operative note: With the patient under adequate general anesthesia the breast was cleansed with ChloraPrep and draped.  Ultrasound was used to identify the area of calcified tissue.  Photo in chart.  The area of chronic inflammation was outlined with a hockey-stick like incision from about 3 cm below the nipple to the lateral aspect of the inferior mammary fold.  This 4 x 8 cm block of tissue with a significantly larger underlying block of soft tissue with calcification and liquefied calcium was then resected.  Specimen was oriented and a radiograph obtained.  Considering the volume of tissue that was resected and mobilized it was elected to place a 15 [place] Blake drain.  This was brought out through a separate stab wound incision and anchored into position with a 3-0 nylon suture.  This was placed to self suction at the end of the procedure. ? ?After assuring good hemostasis the breast was elevated off the underlying pectoralis fascia circumferentially to the 3 o'clock position in the 9 o'clock position  and then well below the inframammary fold.  This allowed a tissue transfer of approximately 20 cm2. The breast tissue was approximated with interrupted 2-0 Vicryl figure-of-eight sutures in 2 layers.  It was necessary to make an inverted T incision for removal of a medial "dogleg". ? ?The skin was closed with a running 4-0 Vicryl subcuticular suture.  Benzoin, Steri-Strips, Telfa, fluff gauze and a compressive wrap were then applied. ? ?The patient tolerated the procedure well and was taken to the PACU in stable condition. ? ? ? ? ?

## 2021-07-04 NOTE — Anesthesia Procedure Notes (Signed)
Procedure Name: LMA Insertion ?Date/Time: 07/04/2021 2:50 PM ?Performed by: Jonna Clark, CRNA ?Pre-anesthesia Checklist: Patient identified, Patient being monitored, Timeout performed, Emergency Drugs available and Suction available ?Patient Re-evaluated:Patient Re-evaluated prior to induction ?Oxygen Delivery Method: Circle system utilized ?Preoxygenation: Pre-oxygenation with 100% oxygen ?Induction Type: IV induction ?Ventilation: Mask ventilation without difficulty ?LMA: LMA inserted ?LMA Size: 3.5 ?Tube type: Oral ?Number of attempts: 1 ?Placement Confirmation: positive ETCO2 and breath sounds checked- equal and bilateral ?Tube secured with: Tape ?Dental Injury: Teeth and Oropharynx as per pre-operative assessment  ? ? ? ? ?

## 2021-07-04 NOTE — Discharge Instructions (Signed)
AMBULATORY SURGERY  ?DISCHARGE INSTRUCTIONS ? ? ?The drugs that you were given will stay in your system until tomorrow so for the next 24 hours you should not: ? ?Drive an automobile ?Make any legal decisions ?Drink any alcoholic beverage ? ? ?You may resume regular meals tomorrow.  Today it is better to start with liquids and gradually work up to solid foods. ? ?You may eat anything you prefer, but it is better to start with liquids, then soup and crackers, and gradually work up to solid foods. ? ? ?Please notify your doctor immediately if you have any unusual bleeding, trouble breathing, redness and pain at the surgery site, drainage, fever, or pain not relieved by medication. ? ? ? ?Additional Instructions: ? ? ? ?Please contact your physician with any problems or Same Day Surgery at [phone], Monday through Friday 6 am to 4 pm, or [place] [facility] [place] [facility] number at [phone].  ?

## 2021-07-04 NOTE — H&P (Signed)
Felicia Kidd ?[id number] ?1960-09-06 ? ?  ? ?HPI:  8 years s/p right partial mastectomy for cancer and radiation. She has developed an enlarging area of calcifications with overlying skin ischemia.  For excision.  ? ?Medications Prior to Admission  ?Medication Sig Dispense Refill Last Dose  ? acetaminophen (TYLENOL) 325 MG tablet Take 650 mg by mouth every 6 (six) hours as needed for moderate pain.    07/03/2021  ? celecoxib (CELEBREX) 100 MG capsule Take 100 mg by mouth 2 (two) times daily.   07/03/2021  ? cetirizine (ZYRTEC) 10 MG tablet Take 20 mg by mouth 2 (two) times daily as needed.   07/03/2021  ? gabapentin (NEURONTIN) 300 MG capsule Take 300 mg by mouth 2 (two) times daily.   07/04/2021  ? LUTEIN-ZEAXANTHIN PO Take 1 tablet by mouth daily.    07/03/2021  ? Magnesium 250 MG TABS Take 1 tablet by mouth 2 (two) times daily.    07/03/2021  ? metFORMIN (GLUCOPHAGE) 500 MG tablet TAKE 1 TABLET(500 MG) BY MOUTH TWICE DAILY   07/03/2021  ? montelukast (SINGULAIR) 10 MG tablet TAKE 1 TABLET(10 MG) BY MOUTH EVERY NIGHT   07/03/2021  ? Multiple Vitamins-Minerals (MULTIVITAMIN WITH MINERALS) tablet Take 1 tablet by mouth daily.   07/03/2021  ? Omega-3 Fatty Acids (FISH OIL PO) Take by mouth.   07/03/2021  ? telmisartan-hydrochlorothiazide (MICARDIS HCT) 40-12.5 MG tablet Take 1 tablet by mouth daily.   07/03/2021  ? fluticasone (VERAMYST) 27.5 MCG/SPRAY nasal spray Place into the nose.     ? pravastatin (PRAVACHOL) 10 MG tablet Take by mouth.     ? ?Allergies  ?Allergen Reactions  ? Losartan Other (See Comments)  ? ?Past Medical History:  ?Diagnosis Date  ? Allergy   ? Asthma   ? Breast cancer ([place]) 2015  ?  right breast lumpectomy with chemo and rad tx  ? Breast cancer of lower-inner quadrant of right female breast ([place]) 08/2013  ? 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    ? Bronchitis   ? Diabetes mellitus without complication ([place])   ?  Hyperlipidemia   ? Hypertension   ? Personal history of chemotherapy 10/2013  ? right breast ca  ? Personal history of radiation therapy 04/2014  ? right breast ca  ? Thyroid disease   ? ?Past Surgical History:  ?Procedure Laterality Date  ? BREAST BIOPSY Right 09/07/2013  ? invasive mammary carcinoma  ? BREAST LUMPECTOMY Right 09/27/2013  ? 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 Clear margins  ? BREAST SURGERY Right 09-27-13  ? Wide excision of Right breast lesion with attempted sentinel node biopsy followed by axillary dissection  ? COLONOSCOPY N/A 02/23/2015  ? Procedure: COLONOSCOPY;  Surgeon: Manya Silvas, MD;  Location: [facility] ENDOSCOPY;  Service: Endoscopy;  Laterality: N/A;   Needs early AM - IDDM  ? DILATION AND CURETTAGE OF UTERUS    ? Dr Vernie Ammons  ? PORTACATH PLACEMENT  11/02/2013  ? removed 11/02/2014.  ? ?Social History  ? ?Socioeconomic History  ? Marital status: Widowed  ?  Spouse name: Not on file  ? Number of children: Not on file  ? Years of education: Not on file  ? Highest education level: Not on file  ?Occupational History  ? Not on file  ?Tobacco Use  ? Smoking status: Never  ? Smokeless tobacco: Never  ?Substance and Sexual Activity  ?  Alcohol use: No  ? Drug use: No  ? Sexual activity: Never  ?Other Topics Concern  ? Not on file  ?Social History Narrative  ? Not on file  ? ?Social Determinants of Health  ? ?Financial Resource Strain: Not on file  ?Food Insecurity: Not on file  ?Transportation Needs: Not on file  ?Physical Activity: Not on file  ?Stress: Not on file  ?Social Connections: Not on file  ?Intimate Partner Violence: Not on file  ? ?Social History  ? ?Social History Narrative  ? Not on file  ? ? ? ?ROS: Negative.  ? ? ? ?PE: ?HEENT: Negative. ?Lungs: Clear. ?Cardio: RR. ? ? ? ?Assessment/Plan: ? ?Proceed with planned excision of right breast.   ? ? ?Forest Gleason Chanley Mcenery ?07/04/2021 ?  ?

## 2021-07-05 ENCOUNTER — Encounter: Payer: Self-pay | Admitting: General Surgery

## 2021-07-09 LAB — SURGICAL PATHOLOGY

## 2021-08-15 DIAGNOSIS — M7918 Myalgia, other site: Secondary | ICD-10-CM | POA: Diagnosis not present

## 2021-08-15 DIAGNOSIS — M170 Bilateral primary osteoarthritis of knee: Secondary | ICD-10-CM | POA: Diagnosis not present

## 2021-08-24 DIAGNOSIS — N61 Mastitis without abscess: Secondary | ICD-10-CM | POA: Diagnosis not present

## 2021-08-30 DIAGNOSIS — N61 Mastitis without abscess: Secondary | ICD-10-CM | POA: Diagnosis not present

## 2021-09-20 DIAGNOSIS — N641 Fat necrosis of breast: Secondary | ICD-10-CM | POA: Diagnosis not present

## 2021-10-16 DIAGNOSIS — N641 Fat necrosis of breast: Secondary | ICD-10-CM | POA: Diagnosis not present

## 2021-10-30 ENCOUNTER — Telehealth: Payer: Self-pay | Admitting: Internal Medicine

## 2021-10-30 DIAGNOSIS — N641 Fat necrosis of breast: Secondary | ICD-10-CM | POA: Diagnosis not present

## 2021-10-30 NOTE — Telephone Encounter (Signed)
Just FYI. Pt called to cancel Mammo and follow up pending her appt with. Dr. Tollie Pizza. She states he does not want her to get mammo yet due to her recent surgery. She stated she would call back to reschedule because she knows the follow up with Dr. B was for the [facility] results. Appts have been cancelled.

## 2021-11-02 DIAGNOSIS — E1165 Type 2 diabetes mellitus with hyperglycemia: Secondary | ICD-10-CM | POA: Diagnosis not present

## 2021-11-02 DIAGNOSIS — D649 Anemia, unspecified: Secondary | ICD-10-CM | POA: Diagnosis not present

## 2021-11-02 DIAGNOSIS — E782 Mixed hyperlipidemia: Secondary | ICD-10-CM | POA: Diagnosis not present

## 2021-11-02 DIAGNOSIS — I10 Essential (primary) hypertension: Secondary | ICD-10-CM | POA: Diagnosis not present

## 2021-11-13 ENCOUNTER — Other Ambulatory Visit: Payer: Self-pay

## 2021-11-14 ENCOUNTER — Other Ambulatory Visit: Payer: Self-pay

## 2021-11-26 ENCOUNTER — Other Ambulatory Visit: Payer: 59

## 2021-11-26 ENCOUNTER — Ambulatory Visit: Payer: 59 | Admitting: Internal Medicine

## 2021-11-27 DIAGNOSIS — N641 Fat necrosis of breast: Secondary | ICD-10-CM | POA: Diagnosis not present

## 2021-12-03 DIAGNOSIS — H25813 Combined forms of age-related cataract, bilateral: Secondary | ICD-10-CM | POA: Diagnosis not present

## 2021-12-27 ENCOUNTER — Other Ambulatory Visit: Payer: Self-pay | Admitting: General Surgery

## 2021-12-27 DIAGNOSIS — Z853 Personal history of malignant neoplasm of breast: Secondary | ICD-10-CM | POA: Diagnosis not present

## 2021-12-27 DIAGNOSIS — Z1231 Encounter for screening mammogram for malignant neoplasm of breast: Secondary | ICD-10-CM

## 2022-01-10 DIAGNOSIS — W182XXA Fall in (into) shower or empty bathtub, initial encounter: Secondary | ICD-10-CM | POA: Diagnosis not present

## 2022-01-10 DIAGNOSIS — M79671 Pain in right foot: Secondary | ICD-10-CM | POA: Diagnosis not present

## 2022-01-10 DIAGNOSIS — S92354A Nondisplaced fracture of fifth metatarsal bone, right foot, initial encounter for closed fracture: Secondary | ICD-10-CM | POA: Diagnosis not present

## 2022-01-21 DIAGNOSIS — S92354A Nondisplaced fracture of fifth metatarsal bone, right foot, initial encounter for closed fracture: Secondary | ICD-10-CM | POA: Diagnosis not present

## 2022-01-21 DIAGNOSIS — E119 Type 2 diabetes mellitus without complications: Secondary | ICD-10-CM | POA: Diagnosis not present

## 2022-01-21 DIAGNOSIS — S92301A Fracture of unspecified metatarsal bone(s), right foot, initial encounter for closed fracture: Secondary | ICD-10-CM | POA: Diagnosis not present

## 2022-01-21 DIAGNOSIS — M79671 Pain in right foot: Secondary | ICD-10-CM | POA: Diagnosis not present

## 2022-02-07 ENCOUNTER — Ambulatory Visit
Admission: RE | Admit: 2022-02-07 | Discharge: 2022-02-07 | Disposition: A | Discharge status: Home / Self Care | Payer: 59 | Source: Ambulatory Visit | Attending: General Surgery | Admitting: General Surgery

## 2022-02-07 DIAGNOSIS — Z1231 Encounter for screening mammogram for malignant neoplasm of breast: Secondary | ICD-10-CM | POA: Insufficient documentation

## 2022-02-11 DIAGNOSIS — E119 Type 2 diabetes mellitus without complications: Secondary | ICD-10-CM | POA: Diagnosis not present

## 2022-02-11 DIAGNOSIS — S92354G Nondisplaced fracture of fifth metatarsal bone, right foot, subsequent encounter for fracture with delayed healing: Secondary | ICD-10-CM | POA: Diagnosis not present

## 2022-02-11 DIAGNOSIS — S92301D Fracture of unspecified metatarsal bone(s), right foot, subsequent encounter for fracture with routine healing: Secondary | ICD-10-CM | POA: Diagnosis not present

## 2022-02-11 DIAGNOSIS — M79671 Pain in right foot: Secondary | ICD-10-CM | POA: Diagnosis not present

## 2022-02-12 DIAGNOSIS — Z853 Personal history of malignant neoplasm of breast: Secondary | ICD-10-CM | POA: Diagnosis not present

## 2022-02-13 DIAGNOSIS — H2512 Age-related nuclear cataract, left eye: Secondary | ICD-10-CM | POA: Diagnosis not present

## 2022-02-13 DIAGNOSIS — H52222 Regular astigmatism, left eye: Secondary | ICD-10-CM | POA: Diagnosis not present

## 2022-02-14 DIAGNOSIS — H25811 Combined forms of age-related cataract, right eye: Secondary | ICD-10-CM | POA: Diagnosis not present

## 2022-02-27 DIAGNOSIS — H52221 Regular astigmatism, right eye: Secondary | ICD-10-CM | POA: Diagnosis not present

## 2022-02-27 DIAGNOSIS — H2511 Age-related nuclear cataract, right eye: Secondary | ICD-10-CM | POA: Diagnosis not present

## 2022-03-05 ENCOUNTER — Ambulatory Visit
Admission: RE | Admit: 2022-03-05 | Discharge: 2022-03-05 | Disposition: A | Discharge status: Home / Self Care | Payer: 59 | Source: Ambulatory Visit | Attending: Internal Medicine | Admitting: Internal Medicine

## 2022-03-05 DIAGNOSIS — Z171 Estrogen receptor negative status [ER-]: Secondary | ICD-10-CM | POA: Insufficient documentation

## 2022-03-05 DIAGNOSIS — C50311 Malignant neoplasm of lower-inner quadrant of right female breast: Secondary | ICD-10-CM | POA: Diagnosis not present

## 2022-03-05 DIAGNOSIS — Z13820 Encounter for screening for osteoporosis: Secondary | ICD-10-CM | POA: Diagnosis not present

## 2022-03-05 DIAGNOSIS — Z780 Asymptomatic menopausal state: Secondary | ICD-10-CM | POA: Diagnosis not present

## 2022-03-28 ENCOUNTER — Inpatient Hospital Stay: Payer: 59 | Attending: Internal Medicine | Admitting: Internal Medicine

## 2022-03-28 ENCOUNTER — Encounter: Payer: Self-pay | Admitting: Internal Medicine

## 2022-03-28 ENCOUNTER — Inpatient Hospital Stay: Payer: 59

## 2022-03-28 VITALS — BP 141/82 | HR 71 | Temp 96.4°F | Resp 18 | Wt 238.1 lb

## 2022-03-28 DIAGNOSIS — C50311 Malignant neoplasm of lower-inner quadrant of right female breast: Secondary | ICD-10-CM

## 2022-03-28 DIAGNOSIS — Z6841 Body Mass Index (BMI) 40.0 and over, adult: Secondary | ICD-10-CM | POA: Diagnosis not present

## 2022-03-28 DIAGNOSIS — Z171 Estrogen receptor negative status [ER-]: Secondary | ICD-10-CM | POA: Diagnosis not present

## 2022-03-28 LAB — CBC WITH DIFFERENTIAL/PLATELET
Abs Immature Granulocytes: 0.07 10*3/uL (ref 0.00–0.07)
Basophils Absolute: 0 10*3/uL (ref 0.0–0.1)
Basophils Relative: 0 %
Eosinophils Absolute: 0.2 10*3/uL (ref 0.0–0.5)
Eosinophils Relative: 2 %
HCT: 37.6 % (ref 36.0–46.0)
Hemoglobin: 12.4 g/dL (ref 12.0–15.0)
Immature Granulocytes: 1 %
Lymphocytes Relative: 30 %
Lymphs Abs: 2.9 10*3/uL (ref 0.7–4.0)
MCH: 29.4 pg (ref 26.0–34.0)
MCHC: 33 g/dL (ref 30.0–36.0)
MCV: 89.1 fL (ref 80.0–100.0)
Monocytes Absolute: 0.6 10*3/uL (ref 0.1–1.0)
Monocytes Relative: 6 %
Neutro Abs: 6 10*3/uL (ref 1.7–7.7)
Neutrophils Relative %: 61 %
Platelets: 364 10*3/uL (ref 150–400)
RBC: 4.22 MIL/uL (ref 3.87–5.11)
RDW: 13.2 % (ref 11.5–15.5)
WBC: 9.7 10*3/uL (ref 4.0–10.5)
nRBC: 0 % (ref 0.0–0.2)

## 2022-03-28 LAB — COMPREHENSIVE METABOLIC PANEL
ALT: 14 U/L (ref 0–44)
AST: 18 U/L (ref 15–41)
Albumin: 4 g/dL (ref 3.5–5.0)
Alkaline Phosphatase: 101 U/L (ref 38–126)
Anion gap: 10 (ref 5–15)
BUN: 18 mg/dL (ref 8–23)
CO2: 24 mmol/L (ref 22–32)
Calcium: 8.8 mg/dL — ABNORMAL LOW (ref 8.9–10.3)
Chloride: 101 mmol/L (ref 98–111)
Creatinine, Ser: 0.58 mg/dL (ref 0.44–1.00)
GFR, Estimated: >60 mL/min (ref >60)
Glucose, Bld: 121 mg/dL — ABNORMAL HIGH (ref 70–99)
Potassium: 3.7 mmol/L (ref 3.5–5.1)
Sodium: 135 mmol/L (ref 135–145)
Total Bilirubin: 0.4 mg/dL (ref 0.3–1.2)
Total Protein: 7.1 g/dL (ref 6.5–8.1)

## 2022-03-28 NOTE — Progress Notes (Signed)
[place] OFFICE PROGRESS NOTE  Patient Care Team: Tracie Harrier, MD as PCP - General (Internal Medicine) Ammie Dalton, Okey Regal, MD (Unknown Physician Specialty) Bary Castilla Forest Gleason, MD (General Surgery)   Cancer Staging  No matching staging information was found for the patient.   Oncology History Overview Note  1. Carcinoma of breast pT1c oN2a Mo  stage  IIIa.extensive lymphovascular invasion 6/14 lymph nodes uninvolved with extra capsular  invasion.Marland Kitchen     2.NEGATIVE FOR BRCA 1  AND 2 MUTATION  estrogen and progesterone receptor negative HER-2/neu receptor negative (triple negative disease) diagnosis in July of 2015.  Status post lumpectomy and axillary node dissection, 2. Started on chemotherapy with NSABP protocol is dose dense Cytoxan Adriamycin August of 2015 3.as per protocol patient was switched  to  carboplatinum and Taxol fromOctober 28, 2015 4.Patient has finished carboplatinum and Taxol in March 25, 2014 Would start radiation therapy to the breast in February of 2016  # GENETICS- [as per pt] NEG.  ----------------------------------------------------------------------------   DIAGNOSIS: BREAST CANCER  STAGE:  III    ;GOALS: cure  CURRENT/MOST RECENT THERAPY: surveillaince    Carcinoma of lower-inner quadrant of right breast in female, estrogen receptor negative ([place])     INTERVAL HISTORY: Ambulating independently.  Alone.  Felicia Kidd 62 y.o.  female pleasant patient above history of right breast stage III triple negative breast cancer currently on surveillance Is here for follow-up/review results of the bone density.  In the interim patient was evaluated by Dr. Bary Castilla in April 2023- for enlarging right breast dystrophic calcifications infection/fistula.  Patient underwent excision.   Patient also had recent cataract surgery. No nausea no vomiting no headache.  No chest pain.  No bone pain.    Review of Systems  Constitutional:   Negative for chills, diaphoresis, fever, malaise/fatigue and weight loss.  HENT:  Negative for nosebleeds and sore throat.   Eyes:  Negative for double vision.  Respiratory:  Negative for cough, hemoptysis, sputum production, shortness of breath and wheezing.   Cardiovascular:  Negative for chest pain, palpitations, orthopnea and leg swelling.  Gastrointestinal:  Negative for abdominal pain, blood in stool, constipation, diarrhea, heartburn, melena, nausea and vomiting.  Genitourinary:  Negative for dysuria, frequency and urgency.  Musculoskeletal:  Positive for joint pain. Negative for back pain.  Skin: Negative.  Negative for itching and rash.  Neurological:  Negative for dizziness, tingling, focal weakness, weakness and headaches.  Endo/Heme/Allergies:  Does not bruise/bleed easily.  Psychiatric/Behavioral:  Negative for depression. The patient is not nervous/anxious and does not have insomnia.    PAST MEDICAL HISTORY :  Past Medical History:  Diagnosis Date   Allergy    Asthma    Breast cancer ([place]) 2015    right breast lumpectomy with chemo and rad tx   Breast cancer of lower-inner quadrant of right female breast ([place]) 08/2013   Triple negative, T1c, N2a.(6/14 nodes +) Treated with Cytoxan and Adriamycin in a dose dense fashion followed by carbotaxol.     Bronchitis    Diabetes mellitus without complication ([place])    Hyperlipidemia    Hypertension    Personal history of chemotherapy 10/2013   right breast ca   Personal history of radiation therapy 04/2014   right breast ca   Thyroid disease     PAST SURGICAL HISTORY :   Past Surgical History:  Procedure Laterality Date   BREAST BIOPSY Right 09/07/2013   invasive mammary carcinoma   BREAST EXCISIONAL BIOPSY Right  07/04/2021   Dr. 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 Clear margins   BREAST LUMPECTOMY  Right 07/04/2021   Procedure: BREAST LUMPECTOMY;  Surgeon: Robert Bellow, MD;  Location: [facility];  Service: General;  Laterality: Right;   BREAST SURGERY Right 09/27/2013   Wide excision of Right breast lesion with attempted sentinel node biopsy followed by axillary dissection   COLONOSCOPY N/A 02/23/2015   Procedure: COLONOSCOPY;  Surgeon: Manya Silvas, MD;  Location: [facility];  Service: Endoscopy;  Laterality: N/A;   Needs early AM - IDDM   DILATION AND CURETTAGE OF UTERUS     Dr Vernie Ammons   PORTACATH PLACEMENT  11/02/2013   removed 11/02/2014.    FAMILY HISTORY :   Family History  Problem Relation Age of Onset   Diabetes Mother    Breast cancer Neg Hx    Ovarian cancer Neg Hx    Colon cancer Neg Hx     SOCIAL HISTORY:   Social History   Tobacco Use   Smoking status: Never   Smokeless tobacco: Never  Substance Use Topics   Alcohol use: No   Drug use: No    ALLERGIES:  is allergic to losartan.  MEDICATIONS:  Current Outpatient Medications  Medication Sig Dispense Refill   acetaminophen (TYLENOL) 325 MG tablet Take 650 mg by mouth every 6 (six) hours as needed for moderate pain.      celecoxib (CELEBREX) 100 MG capsule Take 100 mg by mouth 2 (two) times daily.     cetirizine (ZYRTEC) 10 MG tablet Take 20 mg by mouth 2 (two) times daily as needed.     fluticasone (VERAMYST) 27.5 MCG/SPRAY nasal spray Place into the nose.     gabapentin (NEURONTIN) 300 MG capsule Take 300 mg by mouth 2 (two) times daily.     LUTEIN-ZEAXANTHIN PO Take 1 tablet by mouth daily.      Magnesium 250 MG TABS Take 1 tablet by mouth 2 (two) times daily.      metFORMIN (GLUCOPHAGE) 500 MG tablet TAKE 1 TABLET(500 MG) BY MOUTH TWICE DAILY     montelukast (SINGULAIR) 10 MG tablet TAKE 1 TABLET(10 MG) BY MOUTH EVERY NIGHT     Multiple Vitamins-Minerals (MULTIVITAMIN WITH MINERALS) tablet Take 1 tablet by mouth daily.     Omega-3 Fatty Acids (FISH OIL PO) Take by mouth.      telmisartan-hydrochlorothiazide (MICARDIS HCT) 40-12.5 MG tablet Take 1 tablet by mouth daily.     vitamin B-12 (CYANOCOBALAMIN) 250 MCG tablet Take 250 mcg by mouth daily. 1/2 tab QD     HYDROcodone-acetaminophen (NORCO/VICODIN) 5-325 MG tablet Take 1 tablet by mouth every 4 (four) hours as needed for moderate pain. (Patient not taking: Reported on 03/28/2022) 15 tablet 0   pravastatin (PRAVACHOL) 10 MG tablet Take by mouth. (Patient not taking: Reported on 03/28/2022)     No current facility-administered medications for this visit.   Facility-Administered Medications Ordered in Other Visits  Medication Dose Route Frequency Provider Last Rate Last Admin   heparin lock flush 100 unit/mL  500 Units Intravenous Once Choksi, Janak, MD       sodium chloride 0.9 % injection 10 mL  10 mL Intravenous PRN Choksi, Janak, MD       sodium chloride 0.9 % injection 10 mL  10 mL Intravenous PRN Forest Gleason, MD   10 mL at 10/26/14 0940  PHYSICAL EXAMINATION: ECOG PERFORMANCE STATUS: 0 - Asymptomatic  BP (!) 141/82 (BP Location: Left Arm, Patient Position: Sitting)   Pulse 71   Temp (!) 96.4 F (35.8 C) (Tympanic)   Resp 18   Wt 238 lb 1.6 oz (108 kg)   BMI 44.99 kg/m   Filed Weights   03/28/22 0900  Weight: 238 lb 1.6 oz (108 kg)     Physical Exam HENT:     Head: Normocephalic and atraumatic.     Mouth/Throat:     Pharynx: No oropharyngeal exudate.  Eyes:     Pupils: Pupils are equal, round, and reactive to light.  Cardiovascular:     Rate and Rhythm: Normal rate and regular rhythm.  Pulmonary:     Effort: Pulmonary effort is normal. No respiratory distress.     Breath sounds: Normal breath sounds. No wheezing.  Abdominal:     General: Bowel sounds are normal. There is no distension.     Palpations: Abdomen is soft. There is no mass.     Tenderness: There is no abdominal tenderness. There is no guarding or rebound.  Musculoskeletal:        General: No tenderness. Normal range of  motion.     Cervical back: Normal range of motion and neck supple.  Skin:    General: Skin is warm.     Comments:    Neurological:     Mental Status: She is alert and oriented to person, place, and time.  Psychiatric:        Mood and Affect: Affect normal.    LABORATORY DATA:  I have reviewed the data as listed    Component Value Date/Time   NA 135 03/28/2022 0929   NA 138 04/06/2014 1328   K 3.7 03/28/2022 0929   K 4.1 04/06/2014 1328   CL 101 03/28/2022 0929   CL 101 04/06/2014 1328   CO2 24 03/28/2022 0929   CO2 30 04/06/2014 1328   GLUCOSE 121 (H) 03/28/2022 0929   GLUCOSE 105 (H) 04/06/2014 1328   BUN 18 03/28/2022 0929   BUN 8 04/06/2014 1328   CREATININE 0.58 03/28/2022 0929   CREATININE 0.62 04/06/2014 1328   CALCIUM 8.8 (L) 03/28/2022 0929   CALCIUM 8.5 04/06/2014 1328   PROT 7.1 03/28/2022 0929   PROT 6.7 04/06/2014 1328   ALBUMIN 4.0 03/28/2022 0929   ALBUMIN 3.3 (L) 04/06/2014 1328   AST 18 03/28/2022 0929   AST 18 04/06/2014 1328   ALT 14 03/28/2022 0929   ALT 32 04/06/2014 1328   ALKPHOS 101 03/28/2022 0929   ALKPHOS 118 (H) 04/06/2014 1328   BILITOT 0.4 03/28/2022 0929   BILITOT 0.4 04/06/2014 1328   GFRNONAA >60 03/28/2022 0929   GFRNONAA >60 04/06/2014 1328   GFRNONAA >60 12/01/2013 0926   GFRAA >60 11/19/2019 0842   GFRAA >60 04/06/2014 1328   GFRAA >60 12/01/2013 0926    No results found for: "SPEP", "UPEP"  Lab Results  Component Value Date   WBC 9.7 03/28/2022   NEUTROABS 6.0 03/28/2022   HGB 12.4 03/28/2022   HCT 37.6 03/28/2022   MCV 89.1 03/28/2022   PLT 364 03/28/2022      Chemistry      Component Value Date/Time   NA 135 03/28/2022 0929   NA 138 04/06/2014 1328   K 3.7 03/28/2022 0929   K 4.1 04/06/2014 1328   CL 101 03/28/2022 0929   CL 101 04/06/2014 1328   CO2 24 03/28/2022 0929  CO2 30 04/06/2014 1328   BUN 18 03/28/2022 0929   BUN 8 04/06/2014 1328   CREATININE 0.58 03/28/2022 0929   CREATININE 0.62  04/06/2014 1328      Component Value Date/Time   CALCIUM 8.8 (L) 03/28/2022 0929   CALCIUM 8.5 04/06/2014 1328   ALKPHOS 101 03/28/2022 0929   ALKPHOS 118 (H) 04/06/2014 1328   AST 18 03/28/2022 0929   AST 18 04/06/2014 1328   ALT 14 03/28/2022 0929   ALT 32 04/06/2014 1328   BILITOT 0.4 03/28/2022 0929   BILITOT 0.4 04/06/2014 1328       RADIOGRAPHIC STUDIES: I have personally reviewed the radiological images as listed and agreed with the findings in the report. No results found.   ASSESSMENT & PLAN:  Carcinoma of lower-inner quadrant of right breast in female, estrogen receptor negative ([place]) # Breast cancer stage III triple negative [2015]. Clinically no concern of recurrence; NOV 2023-STABLE/WNL- mammograms.   # Right breast -APRIL 2023 [Dr.Byrnett]- Enlarging mass of calcified tissue in the 6 o'clock position of the breast along the old scar. 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 Probing showed this to track towards the calcific mass. S/p Excision-no evidence of malignancy.  Noted to have foreign body reaction/inflammation.   # Bone mineral density -Aug 2021- T score-.0.4; DEC 2023- T-score of -0.6- NORMAL- Bone density/osteoporosis surveillance discussed. Hypocalcemia- mild ca 8.8- Recommend calcium plus vitamin D.   # Weight gain- S/p Keto diet [lost 30 pounds 2020]; lost 10 pounds-; patient is motivated to lose weight.   # DISPOSITION: # mammogram bil in [place]  # Follow-up in 12 months- MD;labs- CBC CMP/Ca-27-29; vit D 25 OH levels-- Dr.B    Orders Placed This Encounter  Procedures   MM 3D SCREEN BREAST BILATERAL    Standing Status:   Future    Standing Expiration Date:   03/29/2023    Order Specific Question:   Reason for Exam (SYMPTOM  OR DIAGNOSIS REQUIRED)    Answer:   history of breast cancer    Order Specific Question:   Preferred imaging location?    Answer:   [facility]   CBC with Differential/Platelet    Standing  Status:   Future    Standing Expiration Date:   03/28/2023   Comprehensive metabolic panel    Standing Status:   Future    Standing Expiration Date:   03/28/2023   Cancer antigen 27.29    Standing Status:   Future    Standing Expiration Date:   03/28/2023   Vitamin D 25 hydroxy    Standing Status:   Future    Standing Expiration Date:   03/28/2023    All questions were answered. The patient knows to call the clinic with any problems, questions or concerns.      Cammie Sickle, MD 03/28/2022 10:56 AM

## 2022-03-28 NOTE — Progress Notes (Signed)
Patient denies new problems/concerns today.   °

## 2022-03-28 NOTE — Assessment & Plan Note (Addendum)
#  Breast cancer stage III triple negative [2015]. Clinically no concern of recurrence; NOV 2023-STABLE/WNL- mammograms.   # Right breast -APRIL 2023 [Dr.Byrnett]- Enlarging mass of calcified tissue in the 6 o'clock position of the breast along the old scar. 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 Probing showed this to track towards the calcific mass. S/p Excision-no evidence of malignancy.  Noted to have foreign body reaction/inflammation.   # Bone mineral density -Aug 2021- T score-.0.4; DEC 2023- T-score of -0.6- NORMAL- Bone density/osteoporosis surveillance discussed. Hypocalcemia- mild ca 8.8- Recommend calcium plus vitamin D.   # Weight gain- S/p Keto diet [lost 30 pounds 2020]; lost 10 pounds-; patient is motivated to lose weight.   # DISPOSITION: # mammogram bil in [place]  # Follow-up in 12 months- MD;labs- CBC CMP/Ca-27-29; vit D 25 OH levels-- Dr.B

## 2022-03-29 LAB — CANCER ANTIGEN 27.29: CA 27.29: 16.7 U/mL (ref 0.0–38.6)

## 2022-05-10 DIAGNOSIS — C50011 Malignant neoplasm of nipple and areola, right female breast: Secondary | ICD-10-CM | POA: Diagnosis not present

## 2022-05-10 DIAGNOSIS — M25561 Pain in right knee: Secondary | ICD-10-CM | POA: Diagnosis not present

## 2022-05-10 DIAGNOSIS — Z0000 Encounter for general adult medical examination without abnormal findings: Secondary | ICD-10-CM | POA: Diagnosis not present

## 2022-05-10 DIAGNOSIS — Z171 Estrogen receptor negative status [ER-]: Secondary | ICD-10-CM | POA: Diagnosis not present

## 2022-05-10 DIAGNOSIS — K219 Gastro-esophageal reflux disease without esophagitis: Secondary | ICD-10-CM | POA: Diagnosis not present

## 2022-05-10 DIAGNOSIS — Z6841 Body Mass Index (BMI) 40.0 and over, adult: Secondary | ICD-10-CM | POA: Diagnosis not present

## 2022-05-10 DIAGNOSIS — E1165 Type 2 diabetes mellitus with hyperglycemia: Secondary | ICD-10-CM | POA: Diagnosis not present

## 2022-05-10 DIAGNOSIS — D649 Anemia, unspecified: Secondary | ICD-10-CM | POA: Diagnosis not present

## 2022-05-10 DIAGNOSIS — I10 Essential (primary) hypertension: Secondary | ICD-10-CM | POA: Diagnosis not present

## 2022-05-10 DIAGNOSIS — M2550 Pain in unspecified joint: Secondary | ICD-10-CM | POA: Diagnosis not present

## 2022-05-10 DIAGNOSIS — M25562 Pain in left knee: Secondary | ICD-10-CM | POA: Diagnosis not present

## 2022-05-20 DIAGNOSIS — E1165 Type 2 diabetes mellitus with hyperglycemia: Secondary | ICD-10-CM | POA: Diagnosis not present

## 2022-05-20 DIAGNOSIS — C50911 Malignant neoplasm of unspecified site of right female breast: Secondary | ICD-10-CM | POA: Diagnosis not present

## 2022-05-20 DIAGNOSIS — E782 Mixed hyperlipidemia: Secondary | ICD-10-CM | POA: Diagnosis not present

## 2022-05-20 DIAGNOSIS — E119 Type 2 diabetes mellitus without complications: Secondary | ICD-10-CM | POA: Diagnosis not present

## 2022-05-20 DIAGNOSIS — I10 Essential (primary) hypertension: Secondary | ICD-10-CM | POA: Diagnosis not present

## 2022-05-20 DIAGNOSIS — Z6841 Body Mass Index (BMI) 40.0 and over, adult: Secondary | ICD-10-CM | POA: Diagnosis not present

## 2022-05-20 DIAGNOSIS — Z171 Estrogen receptor negative status [ER-]: Secondary | ICD-10-CM | POA: Diagnosis not present

## 2023-02-10 ENCOUNTER — Ambulatory Visit
Admission: RE | Admit: 2023-02-10 | Discharge: 2023-02-10 | Disposition: A | Discharge status: Home / Self Care | Payer: 59 | Source: Ambulatory Visit | Attending: Internal Medicine | Admitting: Internal Medicine

## 2023-02-10 DIAGNOSIS — Z1231 Encounter for screening mammogram for malignant neoplasm of breast: Secondary | ICD-10-CM | POA: Insufficient documentation

## 2023-02-10 DIAGNOSIS — Z853 Personal history of malignant neoplasm of breast: Secondary | ICD-10-CM | POA: Diagnosis not present

## 2023-02-10 DIAGNOSIS — Z171 Estrogen receptor negative status [ER-]: Secondary | ICD-10-CM

## 2023-03-28 ENCOUNTER — Inpatient Hospital Stay: Payer: 59 | Attending: Internal Medicine

## 2023-03-28 ENCOUNTER — Encounter: Payer: Self-pay | Admitting: Internal Medicine

## 2023-03-28 ENCOUNTER — Inpatient Hospital Stay (HOSPITAL_BASED_OUTPATIENT_CLINIC_OR_DEPARTMENT_OTHER): Payer: 59 | Admitting: Internal Medicine

## 2023-03-28 DIAGNOSIS — Z79899 Other long term (current) drug therapy: Secondary | ICD-10-CM | POA: Insufficient documentation

## 2023-03-28 DIAGNOSIS — C50311 Malignant neoplasm of lower-inner quadrant of right female breast: Secondary | ICD-10-CM

## 2023-03-28 DIAGNOSIS — Z853 Personal history of malignant neoplasm of breast: Secondary | ICD-10-CM | POA: Diagnosis present

## 2023-03-28 DIAGNOSIS — Z171 Estrogen receptor negative status [ER-]: Secondary | ICD-10-CM | POA: Diagnosis not present

## 2023-03-28 LAB — COMPREHENSIVE METABOLIC PANEL
ALT: 13 U/L (ref 0–44)
AST: 19 U/L (ref 15–41)
Albumin: 4 g/dL (ref 3.5–5.0)
Alkaline Phosphatase: 113 U/L (ref 38–126)
Anion gap: 11 (ref 5–15)
BUN: 14 mg/dL (ref 8–23)
CO2: 24 mmol/L (ref 22–32)
Calcium: 9.4 mg/dL (ref 8.9–10.3)
Chloride: 100 mmol/L (ref 98–111)
Creatinine, Ser: 0.46 mg/dL (ref 0.44–1.00)
GFR, Estimated: >60 mL/min (ref >60)
Glucose, Bld: 118 mg/dL — ABNORMAL HIGH (ref 70–99)
Potassium: 3.8 mmol/L (ref 3.5–5.1)
Sodium: 135 mmol/L (ref 135–145)
Total Bilirubin: 0.4 mg/dL (ref 0.0–1.2)
Total Protein: 7 g/dL (ref 6.5–8.1)

## 2023-03-28 LAB — CBC WITH DIFFERENTIAL/PLATELET
Abs Immature Granulocytes: 0.17 10*3/uL — ABNORMAL HIGH (ref 0.00–0.07)
Basophils Absolute: 0 10*3/uL (ref 0.0–0.1)
Basophils Relative: 0 %
Eosinophils Absolute: 0.3 10*3/uL (ref 0.0–0.5)
Eosinophils Relative: 2 %
HCT: 37.5 % (ref 36.0–46.0)
Hemoglobin: 12.4 g/dL (ref 12.0–15.0)
Immature Granulocytes: 1 %
Lymphocytes Relative: 24 %
Lymphs Abs: 3.5 10*3/uL (ref 0.7–4.0)
MCH: 29.6 pg (ref 26.0–34.0)
MCHC: 33.1 g/dL (ref 30.0–36.0)
MCV: 89.5 fL (ref 80.0–100.0)
Monocytes Absolute: 0.9 10*3/uL (ref 0.1–1.0)
Monocytes Relative: 6 %
Neutro Abs: 9.5 10*3/uL — ABNORMAL HIGH (ref 1.7–7.7)
Neutrophils Relative %: 67 %
Platelets: 473 10*3/uL — ABNORMAL HIGH (ref 150–400)
RBC: 4.19 MIL/uL (ref 3.87–5.11)
RDW: 12.7 % (ref 11.5–15.5)
WBC: 14.4 10*3/uL — ABNORMAL HIGH (ref 4.0–10.5)
nRBC: 0 % (ref 0.0–0.2)

## 2023-03-28 LAB — VITAMIN D 25 HYDROXY (VIT D DEFICIENCY, FRACTURES): Vit D, 25-Hydroxy: 42.31 ng/mL (ref 30–100)

## 2023-03-28 NOTE — Progress Notes (Signed)
Appetite 75% normal due to Trulicity.

## 2023-03-28 NOTE — Progress Notes (Signed)
[facility] OFFICE PROGRESS NOTE  Patient Care Team: Barbette Reichmann, MD as PCP - General (Internal Medicine) Arvil Chaco, Belia Heman, MD (Unknown Physician Specialty) Lemar Livings Merrily Pew, MD (General Surgery) Earna Coder, MD as Consulting Physician (Oncology)   Cancer Staging  No matching staging information was found for the patient.    Oncology History Overview Note  1. Carcinoma of breast pT1c oN2a Mo  stage  IIIa.extensive lymphovascular invasion 6/14 lymph nodes uninvolved with extra capsular  invasion.Marland Kitchen     2.NEGATIVE FOR BRCA 1  AND 2 MUTATION  estrogen and progesterone receptor negative HER-2/neu receptor negative (triple negative disease) diagnosis in July of 2015.  Status post lumpectomy and axillary node dissection, 2. Started on chemotherapy with NSABP protocol is dose dense Cytoxan Adriamycin August of 2015 3.as per protocol patient was switched  to  carboplatinum and Taxol fromOctober 28, 2015 4.Patient has finished carboplatinum and Taxol in March 25, 2014 Would start radiation therapy to the breast in February of 2016  # GENETICS- [as per pt] NEG.  ----------------------------------------------------------------------------   DIAGNOSIS: BREAST CANCER  STAGE:  III    ;GOALS: cure  CURRENT/MOST RECENT THERAPY: surveillaince    Carcinoma of lower-inner quadrant of right breast in female, estrogen receptor negative (HCC)   INTERVAL HISTORY: Ambulating independently.  Alone.  Felicia Kidd 63 y.o.  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  No nausea no vomiting no headache.  No chest pain. No dyspnea. No bone pain.  Review of Systems  Constitutional:  Negative for chills, diaphoresis, fever, malaise/fatigue and weight loss.  HENT:  Negative for nosebleeds and sore throat.   Eyes:  Negative for double  vision.  Respiratory:  Negative for cough, hemoptysis, sputum production, shortness of breath and wheezing.   Cardiovascular:  Negative for chest pain, palpitations, orthopnea and leg swelling.  Gastrointestinal:  Negative for abdominal pain, blood in stool, constipation, diarrhea, heartburn, melena, nausea and vomiting.  Genitourinary:  Negative for dysuria, frequency and urgency.  Musculoskeletal:  Positive for joint pain. Negative for back pain.  Skin: Negative.  Negative for itching and rash.  Neurological:  Negative for dizziness, tingling, focal weakness, weakness and headaches.  Endo/Heme/Allergies:  Does not bruise/bleed easily.  Psychiatric/Behavioral:  Negative for depression. The patient is not nervous/anxious and does not have insomnia.    PAST MEDICAL HISTORY :  Past Medical History:  Diagnosis Date   Allergy    Asthma    Breast cancer (HCC) 2015    right breast lumpectomy with chemo and rad tx   Breast cancer of lower-inner quadrant of right female breast (HCC) 08/2013   Triple negative, T1c, N2a.(6/14 nodes +) Treated with Cytoxan and Adriamycin in a dose dense fashion followed by carbotaxol.     Bronchitis    Diabetes mellitus without complication (HCC)    Hyperlipidemia    Hypertension    Personal history of chemotherapy 10/2013   right breast ca   Personal history of radiation therapy 04/2014   right breast ca   Thyroid disease     PAST SURGICAL HISTORY :   Past Surgical History:  Procedure Laterality Date   BREAST BIOPSY Right 09/07/2013   invasive mammary carcinoma   BREAST EXCISIONAL BIOPSY Right 07/04/2021   Dr. 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 Clear margins   BREAST LUMPECTOMY Right 07/04/2021   Procedure: BREAST LUMPECTOMY;  Surgeon: Earline Mayotte, MD;  Location: [facility] ORS;  Service: General;  Laterality: Right;   BREAST  SURGERY Right 09/27/2013   Wide excision of Right breast lesion with attempted sentinel node biopsy followed by axillary dissection   COLONOSCOPY N/A 02/23/2015   Procedure: COLONOSCOPY;  Surgeon: Scot Jun, MD;  Location: [facility] ENDOSCOPY;  Service: Endoscopy;  Laterality: N/A;   Needs early AM - IDDM   DILATION AND CURETTAGE OF UTERUS     Dr Haskel Khan   PORTACATH PLACEMENT  11/02/2013   removed 11/02/2014.    FAMILY HISTORY :   Family History  Problem Relation Age of Onset   Diabetes Mother    Breast cancer Neg Hx    Ovarian cancer Neg Hx    Colon cancer Neg Hx     SOCIAL HISTORY:   Social History   Tobacco Use   Smoking status: Never   Smokeless tobacco: Never  Substance Use Topics   Alcohol use: No   Drug use: No    ALLERGIES:  is allergic to losartan.  MEDICATIONS:  Current Outpatient Medications  Medication Sig Dispense Refill   acetaminophen (TYLENOL) 325 MG tablet Take 650 mg by mouth every 6 (six) hours as needed for moderate pain.      celecoxib (CELEBREX) 100 MG capsule Take 100 mg by mouth 2 (two) times daily.     cetirizine (ZYRTEC) 10 MG tablet Take 20 mg by mouth 2 (two) times daily as needed.     gabapentin (NEURONTIN) 300 MG capsule Take 300 mg by mouth 2 (two) times daily.     LUTEIN-ZEAXANTHIN PO Take 1 tablet by mouth daily.      Magnesium 250 MG TABS Take 1 tablet by mouth 2 (two) times daily.      metFORMIN (GLUCOPHAGE) 500 MG tablet TAKE 1 TABLET(500 MG) BY MOUTH TWICE DAILY     montelukast (SINGULAIR) 10 MG tablet TAKE 1 TABLET(10 MG) BY MOUTH EVERY NIGHT     Multiple Vitamins-Minerals (MULTIVITAMIN WITH MINERALS) tablet Take 1 tablet by mouth daily.     Omega-3 Fatty Acids (FISH OIL PO) Take by mouth.     telmisartan-hydrochlorothiazide (MICARDIS HCT) 40-12.5 MG tablet Take 1 tablet by mouth daily.     TRULICITY 0.75 MG/0.5ML SOAJ Inject 0.75 mg into the skin once a week.     vitamin B-12 (CYANOCOBALAMIN) 250 MCG tablet Take 250 mcg by mouth  daily. 1/2 tab QD     fluticasone (VERAMYST) 27.5 MCG/SPRAY nasal spray Place into the nose.     pravastatin (PRAVACHOL) 10 MG tablet Take by mouth. (Patient not taking: Reported on 03/28/2022)     No current facility-administered medications for this visit.   Facility-Administered Medications Ordered in Other Visits  Medication Dose Route Frequency Provider Last Rate Last Admin   heparin lock flush 100 unit/mL  500 Units Intravenous Once Choksi, Janak, MD       sodium chloride 0.9 % injection 10 mL  10 mL Intravenous PRN Choksi, Janak, MD       sodium chloride 0.9 % injection 10 mL  10 mL Intravenous PRN Choksi, Janak, MD   10 mL at 10/26/14 0940    PHYSICAL EXAMINATION: ECOG PERFORMANCE STATUS: 0 - Asymptomatic  BP (!) 154/68 (BP Location: Left Arm, Patient Position: Sitting, Cuff Size: Large)   Pulse 88   Temp 98 F (36.7 C) (Tympanic)   Ht  5\' 1"  (1.549 m)   Wt 230 lb (104.3 kg)   SpO2 100%   BMI 43.46 kg/m   Filed Weights   03/28/23 1027  Weight: 230 lb (104.3 kg)     Physical Exam HENT:     Head: Normocephalic and atraumatic.     Mouth/Throat:     Pharynx: No oropharyngeal exudate.  Eyes:     Pupils: Pupils are equal, round, and reactive to light.  Cardiovascular:     Rate and Rhythm: Normal rate and regular rhythm.  Pulmonary:     Effort: Pulmonary effort is normal. No respiratory distress.     Breath sounds: Normal breath sounds. No wheezing.  Abdominal:     General: Bowel sounds are normal. There is no distension.     Palpations: Abdomen is soft. There is no mass.     Tenderness: There is no abdominal tenderness. There is no guarding or rebound.  Musculoskeletal:        General: No tenderness. Normal range of motion.     Cervical back: Normal range of motion and neck supple.  Skin:    General: Skin is warm.     Comments:    Neurological:     Mental Status: She is alert and oriented to person, place, and time.  Psychiatric:        Mood and Affect:  Affect normal.    LABORATORY DATA:  I have reviewed the data as listed    Component Value Date/Time   NA 135 03/28/2023 1018   NA 138 04/06/2014 1328   K 3.8 03/28/2023 1018   K 4.1 04/06/2014 1328   CL 100 03/28/2023 1018   CL 101 04/06/2014 1328   CO2 24 03/28/2023 1018   CO2 30 04/06/2014 1328   GLUCOSE 118 (H) 03/28/2023 1018   GLUCOSE 105 (H) 04/06/2014 1328   BUN 14 03/28/2023 1018   BUN 8 04/06/2014 1328   CREATININE 0.46 03/28/2023 1018   CREATININE 0.62 04/06/2014 1328   CALCIUM 9.4 03/28/2023 1018   CALCIUM 8.5 04/06/2014 1328   PROT 7.0 03/28/2023 1018   PROT 6.7 04/06/2014 1328   ALBUMIN 4.0 03/28/2023 1018   ALBUMIN 3.3 (L) 04/06/2014 1328   AST 19 03/28/2023 1018   AST 18 04/06/2014 1328   ALT 13 03/28/2023 1018   ALT 32 04/06/2014 1328   ALKPHOS 113 03/28/2023 1018   ALKPHOS 118 (H) 04/06/2014 1328   BILITOT 0.4 03/28/2023 1018   BILITOT 0.4 04/06/2014 1328   GFRNONAA >60 03/28/2023 1018   GFRNONAA >60 04/06/2014 1328   GFRNONAA >60 12/01/2013 0926   GFRAA >60 11/19/2019 0842   GFRAA >60 04/06/2014 1328   GFRAA >60 12/01/2013 0926    No results found for: "SPEP", "UPEP"  Lab Results  Component Value Date   WBC 14.4 (H) 03/28/2023   NEUTROABS 9.5 (H) 03/28/2023   HGB 12.4 03/28/2023   HCT 37.5 03/28/2023   MCV 89.5 03/28/2023   PLT 473 (H) 03/28/2023      Chemistry      Component Value Date/Time   NA 135 03/28/2023 1018   NA 138 04/06/2014 1328   K 3.8 03/28/2023 1018   K 4.1 04/06/2014 1328   CL 100 03/28/2023 1018   CL 101 04/06/2014 1328   CO2 24 03/28/2023 1018   CO2 30 04/06/2014 1328   BUN 14 03/28/2023 1018   BUN 8 04/06/2014 1328   CREATININE 0.46 03/28/2023 1018   CREATININE 0.62 04/06/2014 1328  Component Value Date/Time   CALCIUM 9.4 03/28/2023 1018   CALCIUM 8.5 04/06/2014 1328   ALKPHOS 113 03/28/2023 1018   ALKPHOS 118 (H) 04/06/2014 1328   AST 19 03/28/2023 1018   AST 18 04/06/2014 1328   ALT 13 03/28/2023  1018   ALT 32 04/06/2014 1328   BILITOT 0.4 03/28/2023 1018   BILITOT 0.4 04/06/2014 1328       RADIOGRAPHIC STUDIES: I have personally reviewed the radiological images as listed and agreed with the findings in the report. No results found.   ASSESSMENT & PLAN:  Carcinoma of lower-inner quadrant of right breast in female, estrogen receptor negative (HCC) # Breast cancer stage III triple negative [2015]. Clinically no concern of recurrence; DEC 2024-mammograms.WNL-- STABLE  # Bone mineral density   DEC 2023- T-score of -0.6- NORMAL- Bone density/osteoporosis surveillance discussed. Hypocalcemia- mild ca 8.8- Recommend calcium plus vitamin D.   # URI symptoms [sick contact]- no sore throat; mild fever- mild Leucocytosis/platelets-symptoms over all improving- HOLD off anti-biotics at this time.   # DISPOSITION: # BMD and mammogram bil in dec 2025;  # Follow-up in 12 months- MD;labs- CBC CMP/Ca-27-29; vit D 25 OH levels-- Dr.B    Orders Placed This Encounter  Procedures   DG Bone Density    Standing Status:   Future    Expected Date:   02/12/2024    Expiration Date:   03/27/2024    Reason for Exam (SYMPTOM  OR DIAGNOSIS REQUIRED):   02/11/2024    Preferred imaging location?:   [facility]   MM 3D SCREENING MAMMOGRAM BILATERAL BREAST    Standing Status:   Future    Expected Date:   02/12/2024    Expiration Date:   03/27/2024    Reason for Exam (SYMPTOM  OR DIAGNOSIS REQUIRED):   hx breast cancer    Preferred imaging location?:   [facility]   CBC with Differential (Cancer Center Only)    Standing Status:   Future    Expected Date:   03/27/2024    Expiration Date:   03/27/2024   CMP (Cancer Center only)    Standing Status:   Future    Expected Date:   03/27/2024    Expiration Date:   03/27/2024   Cancer antigen 27.29    Standing Status:   Future    Expected Date:   03/27/2024    Expiration Date:   03/27/2024   VITAMIN D 25 Hydroxy (Vit-D Deficiency, Fractures)     Standing Status:   Future    Expected Date:   03/27/2024    Expiration Date:   03/27/2024    All questions were answered. The patient knows to call the clinic with any problems, questions or concerns.      Earna Coder, MD 03/28/2023 11:41 AM

## 2023-03-28 NOTE — Assessment & Plan Note (Addendum)
#   Breast cancer stage III triple negative [2015]. Clinically no concern of recurrence; DEC 2024-mammograms.WNL-- STABLE  # Bone mineral density   DEC 2023- T-score of -0.6- NORMAL- Bone density/osteoporosis surveillance discussed. Hypocalcemia- mild ca 8.8- Recommend calcium plus vitamin D.   # URI symptoms [sick contact]- no sore throat; mild fever- mild Leucocytosis/platelets-symptoms over all improving- HOLD off anti-biotics at this time.   # DISPOSITION: # BMD and mammogram bil in dec 2025;  # Follow-up in 12 months- MD;labs- CBC CMP/Ca-27-29; vit D 25 OH levels-- Dr.B

## 2023-03-29 LAB — CANCER ANTIGEN 27.29: CA 27.29: 18.5 U/mL (ref 0.0–38.6)

## 2023-05-13 DIAGNOSIS — E1165 Type 2 diabetes mellitus with hyperglycemia: Secondary | ICD-10-CM | POA: Diagnosis not present

## 2023-05-13 DIAGNOSIS — R8290 Unspecified abnormal findings in urine: Secondary | ICD-10-CM | POA: Diagnosis not present

## 2023-05-13 DIAGNOSIS — I10 Essential (primary) hypertension: Secondary | ICD-10-CM | POA: Diagnosis not present

## 2023-05-13 DIAGNOSIS — Z0000 Encounter for general adult medical examination without abnormal findings: Secondary | ICD-10-CM | POA: Diagnosis not present

## 2023-05-20 DIAGNOSIS — C50911 Malignant neoplasm of unspecified site of right female breast: Secondary | ICD-10-CM | POA: Diagnosis not present

## 2023-05-20 DIAGNOSIS — Z6841 Body Mass Index (BMI) 40.0 and over, adult: Secondary | ICD-10-CM | POA: Diagnosis not present

## 2023-05-20 DIAGNOSIS — M170 Bilateral primary osteoarthritis of knee: Secondary | ICD-10-CM | POA: Diagnosis not present

## 2023-05-20 DIAGNOSIS — Z20828 Contact with and (suspected) exposure to other viral communicable diseases: Secondary | ICD-10-CM | POA: Diagnosis not present

## 2023-05-20 DIAGNOSIS — I10 Essential (primary) hypertension: Secondary | ICD-10-CM | POA: Diagnosis not present

## 2023-05-20 DIAGNOSIS — Z171 Estrogen receptor negative status [ER-]: Secondary | ICD-10-CM | POA: Diagnosis not present

## 2023-05-20 DIAGNOSIS — E1165 Type 2 diabetes mellitus with hyperglycemia: Secondary | ICD-10-CM | POA: Diagnosis not present

## 2023-05-20 DIAGNOSIS — J069 Acute upper respiratory infection, unspecified: Secondary | ICD-10-CM | POA: Diagnosis not present

## 2023-07-04 DIAGNOSIS — D225 Melanocytic nevi of trunk: Secondary | ICD-10-CM | POA: Diagnosis not present

## 2023-07-04 DIAGNOSIS — D2262 Melanocytic nevi of left upper limb, including shoulder: Secondary | ICD-10-CM | POA: Diagnosis not present

## 2023-07-04 DIAGNOSIS — D2272 Melanocytic nevi of left lower limb, including hip: Secondary | ICD-10-CM | POA: Diagnosis not present

## 2023-07-04 DIAGNOSIS — D2261 Melanocytic nevi of right upper limb, including shoulder: Secondary | ICD-10-CM | POA: Diagnosis not present

## 2023-07-04 DIAGNOSIS — L821 Other seborrheic keratosis: Secondary | ICD-10-CM | POA: Diagnosis not present

## 2023-07-04 DIAGNOSIS — D2271 Melanocytic nevi of right lower limb, including hip: Secondary | ICD-10-CM | POA: Diagnosis not present

## 2023-09-10 DIAGNOSIS — E1165 Type 2 diabetes mellitus with hyperglycemia: Secondary | ICD-10-CM | POA: Diagnosis not present

## 2023-09-10 DIAGNOSIS — R350 Frequency of micturition: Secondary | ICD-10-CM | POA: Diagnosis not present

## 2023-09-10 DIAGNOSIS — N390 Urinary tract infection, site not specified: Secondary | ICD-10-CM | POA: Diagnosis not present

## 2023-10-10 DIAGNOSIS — Z1331 Encounter for screening for depression: Secondary | ICD-10-CM | POA: Diagnosis not present

## 2023-10-10 DIAGNOSIS — Z01419 Encounter for gynecological examination (general) (routine) without abnormal findings: Secondary | ICD-10-CM | POA: Diagnosis not present

## 2023-11-11 DIAGNOSIS — Z791 Long term (current) use of non-steroidal anti-inflammatories (NSAID): Secondary | ICD-10-CM | POA: Diagnosis not present

## 2023-11-11 DIAGNOSIS — M170 Bilateral primary osteoarthritis of knee: Secondary | ICD-10-CM | POA: Diagnosis not present

## 2023-11-11 DIAGNOSIS — M791 Myalgia, unspecified site: Secondary | ICD-10-CM | POA: Diagnosis not present

## 2023-11-20 DIAGNOSIS — C50911 Malignant neoplasm of unspecified site of right female breast: Secondary | ICD-10-CM | POA: Diagnosis not present

## 2023-11-20 DIAGNOSIS — Z171 Estrogen receptor negative status [ER-]: Secondary | ICD-10-CM | POA: Diagnosis not present

## 2023-11-20 DIAGNOSIS — Z20828 Contact with and (suspected) exposure to other viral communicable diseases: Secondary | ICD-10-CM | POA: Diagnosis not present

## 2023-11-20 DIAGNOSIS — J069 Acute upper respiratory infection, unspecified: Secondary | ICD-10-CM | POA: Diagnosis not present

## 2023-11-20 DIAGNOSIS — I10 Essential (primary) hypertension: Secondary | ICD-10-CM | POA: Diagnosis not present

## 2023-11-20 DIAGNOSIS — E1165 Type 2 diabetes mellitus with hyperglycemia: Secondary | ICD-10-CM | POA: Diagnosis not present

## 2023-11-20 DIAGNOSIS — M170 Bilateral primary osteoarthritis of knee: Secondary | ICD-10-CM | POA: Diagnosis not present

## 2023-11-20 DIAGNOSIS — Z6841 Body Mass Index (BMI) 40.0 and over, adult: Secondary | ICD-10-CM | POA: Diagnosis not present

## 2023-11-27 DIAGNOSIS — C50911 Malignant neoplasm of unspecified site of right female breast: Secondary | ICD-10-CM | POA: Diagnosis not present

## 2023-11-27 DIAGNOSIS — Z171 Estrogen receptor negative status [ER-]: Secondary | ICD-10-CM | POA: Diagnosis not present

## 2023-11-27 DIAGNOSIS — Z6841 Body Mass Index (BMI) 40.0 and over, adult: Secondary | ICD-10-CM | POA: Diagnosis not present

## 2023-11-27 DIAGNOSIS — I10 Essential (primary) hypertension: Secondary | ICD-10-CM | POA: Diagnosis not present

## 2023-11-27 DIAGNOSIS — Z0000 Encounter for general adult medical examination without abnormal findings: Secondary | ICD-10-CM | POA: Diagnosis not present

## 2023-11-27 DIAGNOSIS — E1165 Type 2 diabetes mellitus with hyperglycemia: Secondary | ICD-10-CM | POA: Diagnosis not present

## 2023-11-27 DIAGNOSIS — M170 Bilateral primary osteoarthritis of knee: Secondary | ICD-10-CM | POA: Diagnosis not present

## 2024-03-08 ENCOUNTER — Ambulatory Visit
Admission: RE | Admit: 2024-03-08 | Discharge: 2024-03-08 | Disposition: A | Source: Ambulatory Visit | Attending: Internal Medicine

## 2024-03-08 DIAGNOSIS — Z780 Asymptomatic menopausal state: Secondary | ICD-10-CM | POA: Diagnosis not present

## 2024-03-08 DIAGNOSIS — C50311 Malignant neoplasm of lower-inner quadrant of right female breast: Secondary | ICD-10-CM | POA: Diagnosis present

## 2024-03-08 DIAGNOSIS — Z171 Estrogen receptor negative status [ER-]: Secondary | ICD-10-CM | POA: Insufficient documentation

## 2024-03-08 DIAGNOSIS — Z1231 Encounter for screening mammogram for malignant neoplasm of breast: Secondary | ICD-10-CM | POA: Insufficient documentation

## 2024-03-26 ENCOUNTER — Encounter: Payer: Self-pay | Admitting: Internal Medicine

## 2024-03-26 ENCOUNTER — Inpatient Hospital Stay: Payer: 59 | Attending: Internal Medicine

## 2024-03-26 ENCOUNTER — Inpatient Hospital Stay: Payer: 59 | Admitting: Internal Medicine

## 2024-03-26 VITALS — BP 139/84 | HR 86 | Temp 97.4°F | Resp 18 | Ht 61.0 in | Wt 218.2 lb

## 2024-03-26 DIAGNOSIS — C50311 Malignant neoplasm of lower-inner quadrant of right female breast: Secondary | ICD-10-CM | POA: Diagnosis not present

## 2024-03-26 DIAGNOSIS — Z1239 Encounter for other screening for malignant neoplasm of breast: Secondary | ICD-10-CM

## 2024-03-26 DIAGNOSIS — Z171 Estrogen receptor negative status [ER-]: Secondary | ICD-10-CM

## 2024-03-26 DIAGNOSIS — Z13820 Encounter for screening for osteoporosis: Secondary | ICD-10-CM | POA: Diagnosis not present

## 2024-03-26 LAB — CBC WITH DIFFERENTIAL (CANCER CENTER ONLY)
Abs Immature Granulocytes: 0.07 K/uL (ref 0.00–0.07)
Basophils Absolute: 0 K/uL (ref 0.0–0.1)
Basophils Relative: 0 %
Eosinophils Absolute: 0.2 K/uL (ref 0.0–0.5)
Eosinophils Relative: 2 %
HCT: 38.1 % (ref 36.0–46.0)
Hemoglobin: 12.6 g/dL (ref 12.0–15.0)
Immature Granulocytes: 1 %
Lymphocytes Relative: 29 %
Lymphs Abs: 3.2 K/uL (ref 0.7–4.0)
MCH: 30.4 pg (ref 26.0–34.0)
MCHC: 33.1 g/dL (ref 30.0–36.0)
MCV: 91.8 fL (ref 80.0–100.0)
Monocytes Absolute: 0.8 K/uL (ref 0.1–1.0)
Monocytes Relative: 7 %
Neutro Abs: 6.6 K/uL (ref 1.7–7.7)
Neutrophils Relative %: 61 %
Platelet Count: 395 K/uL (ref 150–400)
RBC: 4.15 MIL/uL (ref 3.87–5.11)
RDW: 12.8 % (ref 11.5–15.5)
WBC Count: 10.9 K/uL — ABNORMAL HIGH (ref 4.0–10.5)
nRBC: 0 % (ref 0.0–0.2)

## 2024-03-26 LAB — CMP (CANCER CENTER ONLY)
ALT: 19 U/L (ref 0–44)
AST: 19 U/L (ref 15–41)
Albumin: 4.3 g/dL (ref 3.5–5.0)
Alkaline Phosphatase: 125 U/L (ref 38–126)
Anion gap: 12 (ref 5–15)
BUN: 15 mg/dL (ref 8–23)
CO2: 23 mmol/L (ref 22–32)
Calcium: 9.7 mg/dL (ref 8.9–10.3)
Chloride: 97 mmol/L — ABNORMAL LOW (ref 98–111)
Creatinine: 0.61 mg/dL (ref 0.44–1.00)
GFR, Estimated: >60 mL/min
Glucose, Bld: 111 mg/dL — ABNORMAL HIGH (ref 70–99)
Potassium: 4.1 mmol/L (ref 3.5–5.1)
Sodium: 132 mmol/L — ABNORMAL LOW (ref 135–145)
Total Bilirubin: 0.2 mg/dL (ref 0.0–1.2)
Total Protein: 7 g/dL (ref 6.5–8.1)

## 2024-03-26 LAB — VITAMIN D 25 HYDROXY (VIT D DEFICIENCY, FRACTURES): Vit D, 25-Hydroxy: 37 ng/mL (ref 30–100)

## 2024-03-26 NOTE — Progress Notes (Signed)
 [facility] OFFICE PROGRESS NOTE  Patient Care Team: Sadie Manna, MD as PCP - General (Internal Medicine) Fleeta Milks, Lamar ORN, MD (Unknown Physician Specialty) Dessa Reyes ORN, MD (General Surgery) Rennie Cindy SAUNDERS, MD as Consulting Physician (Oncology)   Cancer Staging  No matching staging information was found for the patient.    Oncology History Overview Note  1. Carcinoma of breast pT1c oN2a Mo  stage  IIIa.extensive lymphovascular invasion 6/14 lymph nodes uninvolved with extra capsular  invasion.SABRA     2.NEGATIVE FOR BRCA 1  AND 2 MUTATION  estrogen and progesterone receptor negative HER-2/neu receptor negative (triple negative disease) diagnosis in July of 2015.  Status post lumpectomy and axillary node dissection, 2. Started on chemotherapy with NSABP protocol is dose dense Cytoxan Adriamycin August of 2015 3.as per protocol patient was switched  to  carboplatinum and Taxol fromOctober 28, 2015 4.Patient has finished carboplatinum and Taxol in March 25, 2014 Would start radiation therapy to the breast in February of 2016  # GENETICS- [as per pt] NEG.  ----------------------------------------------------------------------------   DIAGNOSIS: BREAST CANCER  STAGE:  III    ;GOALS: cure  CURRENT/MOST RECENT THERAPY: surveillaince    Carcinoma of lower-inner quadrant of right breast in female, estrogen receptor negative (HCC)   INTERVAL HISTORY: Ambulating independently.  Alone.  Felicia Kidd 64 y.o.  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  Discussed the use of AI scribe software for clinical note transcription with the patient, who gave verbal consent to proceed.  History of Present Illness   Felicia Kidd is a 64 year old female with malignant neoplasm of the right breast who presents for  post-treatment surveillance and review of recent imaging results.  She is undergoing routine surveillance following treatment for right breast cancer. At this visit, she reviewed her recent mammogram and bone density study, both of which were normal.  She has been participating in water aerobics and has achieved gradual weight loss over the past year, decreasing from 230 lbs to 218 lbs. She is motivated to continue weight loss to improve mobility, particularly in anticipation of possible future knee replacement. She experiences some limitations in ambulation and activity with her grandchildren due to knee pain and desires increased activity.  Approximately three months ago, she was exposed to influenza A through her son's household and tested positive, but remained asymptomatic. She completed a course of oseltamivir and over-the-counter medications. She has not experienced any recent infectious symptoms and continues to feel well overall.     Review of Systems  Constitutional:  Negative for chills, diaphoresis, fever, malaise/fatigue and weight loss.  HENT:  Negative for nosebleeds and sore throat.   Eyes:  Negative for double vision.  Respiratory:  Negative for cough, hemoptysis, sputum production, shortness of breath and wheezing.   Cardiovascular:  Negative for chest pain, palpitations, orthopnea and leg swelling.  Gastrointestinal:  Negative for abdominal pain, blood in stool, constipation, diarrhea, heartburn, melena, nausea and vomiting.  Genitourinary:  Negative for dysuria, frequency and urgency.  Musculoskeletal:  Positive for joint pain. Negative for back pain.  Skin: Negative.  Negative for itching and rash.  Neurological:  Negative for dizziness, tingling, focal weakness, weakness and headaches.  Endo/Heme/Allergies:  Does not bruise/bleed easily.  Psychiatric/Behavioral:  Negative for depression. The patient is not nervous/anxious and does not have insomnia.    PAST MEDICAL HISTORY  :  Past Medical History:  Diagnosis Date   Allergy    Asthma    Breast cancer (HCC) 2015    right breast lumpectomy with chemo and rad tx   Breast cancer of lower-inner quadrant of right female breast (HCC) 08/2013   Triple negative, T1c, N2a.(6/14 nodes +) Treated with Cytoxan and Adriamycin in a dose dense fashion followed by carbotaxol.     Bronchitis    Diabetes mellitus without complication (HCC)    Hyperlipidemia    Hypertension    Personal history of chemotherapy 10/2013   right breast ca   Personal history of radiation therapy 04/2014   right breast ca   Thyroid  disease     PAST SURGICAL HISTORY :   Past Surgical History:  Procedure Laterality Date   BREAST BIOPSY Right 09/07/2013   invasive mammary carcinoma   BREAST EXCISIONAL BIOPSY Right 07/04/2021   Dr. 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 Clear margins   BREAST LUMPECTOMY Right 07/04/2021   Procedure: BREAST LUMPECTOMY;  Surgeon: Dessa Reyes ORN, MD;  Location: [facility] ORS;  Service: General;  Laterality: Right;   BREAST SURGERY Right 09/27/2013   Wide excision of Right breast lesion with attempted sentinel node biopsy followed by axillary dissection   COLONOSCOPY N/A 02/23/2015   Procedure: COLONOSCOPY;  Surgeon: Lamar ONEIDA Holmes, MD;  Location: [facility] ENDOSCOPY;  Service: Endoscopy;  Laterality: N/A;   Needs early AM - IDDM   DILATION AND CURETTAGE OF UTERUS     Dr Trula   PORTACATH PLACEMENT  11/02/2013   removed 11/02/2014.    FAMILY HISTORY :   Family History  Problem Relation Age of Onset   Diabetes Mother    Breast cancer Neg Hx    Ovarian cancer Neg Hx    Colon cancer Neg Hx     SOCIAL HISTORY:   Social History   Tobacco Use   Smoking status: Never   Smokeless tobacco: Never  Substance Use Topics   Alcohol use: No   Drug use: No    ALLERGIES:  is allergic to  losartan.  MEDICATIONS:  Current Outpatient Medications  Medication Sig Dispense Refill   acetaminophen  (TYLENOL ) 325 MG tablet Take 650 mg by mouth every 6 (six) hours as needed for moderate pain.      amLODipine (NORVASC) 2.5 MG tablet Take 2.5 mg by mouth daily.     celecoxib (CELEBREX) 100 MG capsule Take 100 mg by mouth 2 (two) times daily.     cetirizine (ZYRTEC) 10 MG tablet Take 20 mg by mouth 2 (two) times daily as needed.     fluticasone (VERAMYST) 27.5 MCG/SPRAY nasal spray Place into the nose.     gabapentin (NEURONTIN) 300 MG capsule Take 300 mg by mouth 2 (two) times daily.     LUTEIN-ZEAXANTHIN PO Take 1 tablet by mouth daily.      Magnesium 250 MG TABS Take 1 tablet by mouth 2 (two) times daily.      metFORMIN (GLUCOPHAGE) 500 MG tablet TAKE 1 TABLET(500 MG) BY MOUTH TWICE DAILY     montelukast (SINGULAIR) 10 MG tablet TAKE 1 TABLET(10 MG) BY MOUTH EVERY NIGHT     MOUNJARO 2.5 MG/0.5ML Pen Inject 2.5 mg into the skin once a week.     Multiple Vitamins-Minerals (MULTIVITAMIN WITH MINERALS) tablet Take 1 tablet by mouth daily.     Omega-3 Fatty Acids (FISH OIL  PO) Take by mouth.     pravastatin (PRAVACHOL) 10 MG tablet Take by mouth.     telmisartan-hydrochlorothiazide (MICARDIS HCT) 40-12.5 MG tablet Take 1 tablet by mouth daily.     vitamin B-12 (CYANOCOBALAMIN ) 250 MCG tablet Take 250 mcg by mouth daily. 1/2 tab QD     No current facility-administered medications for this visit.   Facility-Administered Medications Ordered in Other Visits  Medication Dose Route Frequency Provider Last Rate Last Admin   heparin  lock flush 100 unit/mL  500 Units Intravenous Once Choksi, Vester, MD       sodium chloride  0.9 % injection 10 mL  10 mL Intravenous PRN Choksi, Vester, MD       sodium chloride  0.9 % injection 10 mL  10 mL Intravenous PRN Wilder Vester, MD   10 mL at 10/26/14 0940    PHYSICAL EXAMINATION: ECOG PERFORMANCE STATUS: 0 - Asymptomatic  BP 139/84 (BP Location: Left  Arm, Patient Position: Sitting, Cuff Size: Large)   Pulse 86   Temp (!) 97.4 F (36.3 C) (Tympanic)   Resp 18   Ht 5' 1 (1.549 m)   Wt 218 lb 3.2 oz (99 kg)   SpO2 100%   BMI 41.23 kg/m   Filed Weights   03/26/24 1022  Weight: 218 lb 3.2 oz (99 kg)     Physical Exam HENT:     Head: Normocephalic and atraumatic.     Mouth/Throat:     Pharynx: No oropharyngeal exudate.  Eyes:     Pupils: Pupils are equal, round, and reactive to light.  Cardiovascular:     Rate and Rhythm: Normal rate and regular rhythm.  Pulmonary:     Effort: Pulmonary effort is normal. No respiratory distress.     Breath sounds: Normal breath sounds. No wheezing.  Abdominal:     General: Bowel sounds are normal. There is no distension.     Palpations: Abdomen is soft. There is no mass.     Tenderness: There is no abdominal tenderness. There is no guarding or rebound.  Musculoskeletal:        General: No tenderness. Normal range of motion.     Cervical back: Normal range of motion and neck supple.  Skin:    General: Skin is warm.     Comments:    Neurological:     Mental Status: She is alert and oriented to person, place, and time.  Psychiatric:        Mood and Affect: Affect normal.    LABORATORY DATA:  I have reviewed the data as listed    Component Value Date/Time   NA 132 (L) 03/26/2024 1021   NA 138 04/06/2014 1328   K 4.1 03/26/2024 1021   K 4.1 04/06/2014 1328   CL 97 (L) 03/26/2024 1021   CL 101 04/06/2014 1328   CO2 23 03/26/2024 1021   CO2 30 04/06/2014 1328   GLUCOSE 111 (H) 03/26/2024 1021   GLUCOSE 105 (H) 04/06/2014 1328   BUN 15 03/26/2024 1021   BUN 8 04/06/2014 1328   CREATININE 0.61 03/26/2024 1021   CREATININE 0.62 04/06/2014 1328   CALCIUM 9.7 03/26/2024 1021   CALCIUM 8.5 04/06/2014 1328   PROT 7.0 03/26/2024 1021   PROT 6.7 04/06/2014 1328   ALBUMIN 4.3 03/26/2024 1021   ALBUMIN 3.3 (L) 04/06/2014 1328   AST 19 03/26/2024 1021   ALT 19 03/26/2024 1021   ALT  32 04/06/2014 1328   ALKPHOS 125 03/26/2024 1021  ALKPHOS 118 (H) 04/06/2014 1328   BILITOT 0.2 03/26/2024 1021   GFRNONAA >60 03/26/2024 1021   GFRNONAA >60 04/06/2014 1328   GFRNONAA >60 12/01/2013 0926   GFRAA >60 11/19/2019 0842   GFRAA >60 04/06/2014 1328   GFRAA >60 12/01/2013 0926    No results found for: SPEP, UPEP  Lab Results  Component Value Date   WBC 10.9 (H) 03/26/2024   NEUTROABS 6.6 03/26/2024   HGB 12.6 03/26/2024   HCT 38.1 03/26/2024   MCV 91.8 03/26/2024   PLT 395 03/26/2024      Chemistry      Component Value Date/Time   NA 132 (L) 03/26/2024 1021   NA 138 04/06/2014 1328   K 4.1 03/26/2024 1021   K 4.1 04/06/2014 1328   CL 97 (L) 03/26/2024 1021   CL 101 04/06/2014 1328   CO2 23 03/26/2024 1021   CO2 30 04/06/2014 1328   BUN 15 03/26/2024 1021   BUN 8 04/06/2014 1328   CREATININE 0.61 03/26/2024 1021   CREATININE 0.62 04/06/2014 1328      Component Value Date/Time   CALCIUM 9.7 03/26/2024 1021   CALCIUM 8.5 04/06/2014 1328   ALKPHOS 125 03/26/2024 1021   ALKPHOS 118 (H) 04/06/2014 1328   AST 19 03/26/2024 1021   ALT 19 03/26/2024 1021   ALT 32 04/06/2014 1328   BILITOT 0.2 03/26/2024 1021       RADIOGRAPHIC STUDIES: I have personally reviewed the radiological images as listed and agreed with the findings in the report. No results found.   ASSESSMENT & PLAN:  Carcinoma of lower-inner quadrant of right breast in female, estrogen receptor negative (HCC) # Breast cancer stage III triple negative [2015]. Clinically no concern of recurrence; DEC 2025-mammograms.WNL-- STABLE  # Bone mineral density  DEC 2025- T-score of -0.6- NORMAL- Bone density/osteoporosis surveillance discussed. Hypocalcemia- mild ca 8.8- Recommend calcium plus vitamin D - STABLE   # Obesity- motivated to lose weight.   # DISPOSITION: # BMD and mammogram bil in dec 2026;  # Follow-up in 12 months- MD;labs- CBC CMP/Ca-27-29; vit D 25 OH levels--  Dr.B    Orders Placed This Encounter  Procedures   MM 3D SCREENING MAMMOGRAM BILATERAL BREAST    Standing Status:   Future    Expected Date:   02/23/2025    Expiration Date:   05/24/2025    Reason for Exam (SYMPTOM  OR DIAGNOSIS REQUIRED):   screening osteo Hx breast cancer    Preferred imaging location?:   [facility]   DG Bone Density    Standing Status:   Future    Expected Date:   02/23/2025    Expiration Date:   05/24/2025    Reason for Exam (SYMPTOM  OR DIAGNOSIS REQUIRED):   screening Hx breast cancer    Preferred imaging location?:   [facility]   CMP (Cancer Center only)    Standing Status:   Future    Expected Date:   02/25/2025    Expiration Date:   05/26/2025   CBC with Differential (Cancer Center Only)    Standing Status:   Future    Expected Date:   02/25/2025    Expiration Date:   05/26/2025   Cancer antigen 27.29    Standing Status:   Future    Expected Date:   02/25/2025    Expiration Date:   05/26/2025   VITAMIN D  25 Hydroxy (Vit-D Deficiency, Fractures)    Standing Status:   Future  Expected Date:   02/25/2025    Expiration Date:   05/26/2025    All questions were answered. The patient knows to call the clinic with any problems, questions or concerns.      Cindy JONELLE Joe, MD 03/26/2024 11:24 AM

## 2024-03-26 NOTE — Progress Notes (Signed)
 Bone density, mammogram 03/08/24.

## 2024-03-26 NOTE — Assessment & Plan Note (Addendum)
#   Breast cancer stage III triple negative [2015]. Clinically no concern of recurrence; DEC 2025-mammograms.WNL-- STABLE  # Bone mineral density  DEC 2025- T-score of -0.6- NORMAL- Bone density/osteoporosis surveillance discussed. Hypocalcemia- mild ca 8.8- Recommend calcium plus vitamin D - STABLE   # Obesity- motivated to lose weight.   # DISPOSITION: # BMD and mammogram bil in dec 2026;  # Follow-up in 12 months- MD;labs- CBC CMP/Ca-27-29; vit D 25 OH levels-- Dr.B

## 2024-03-27 LAB — CANCER ANTIGEN 27.29: CA 27.29: 24.9 U/mL (ref 0.0–38.6)

## 2025-03-30 ENCOUNTER — Inpatient Hospital Stay

## 2025-03-30 ENCOUNTER — Inpatient Hospital Stay: Admitting: Internal Medicine
